# Patient Record
Sex: Female | Born: 1970 | Race: Black or African American | Hispanic: No | Marital: Single | Smoking: Never smoker
Health system: Southern US, Community
[De-identification: ages and names within clinical notes are randomized; demographics above are authoritative.]

## PROBLEM LIST (undated history)

## (undated) DIAGNOSIS — E119 Type 2 diabetes mellitus without complications: Secondary | ICD-10-CM

## (undated) DIAGNOSIS — E7800 Pure hypercholesterolemia, unspecified: Secondary | ICD-10-CM

## (undated) DIAGNOSIS — I5181 Takotsubo syndrome: Secondary | ICD-10-CM

## (undated) DIAGNOSIS — I10 Essential (primary) hypertension: Secondary | ICD-10-CM

## (undated) DIAGNOSIS — R7303 Prediabetes: Secondary | ICD-10-CM

## (undated) DIAGNOSIS — N200 Calculus of kidney: Secondary | ICD-10-CM

## (undated) HISTORY — DX: Type 2 diabetes mellitus without complications: E11.9

## (undated) HISTORY — PX: OTHER SURGICAL HISTORY: SHX169

## (undated) HISTORY — DX: Takotsubo syndrome: I51.81

## (undated) HISTORY — PX: BARIATRIC SURGERY: SHX1103

## (undated) HISTORY — PX: BREAST BIOPSY: SHX20

## (undated) HISTORY — DX: Calculus of kidney: N20.0

## (undated) HISTORY — PX: MENISCUS REPAIR: SHX5179

## (undated) HISTORY — PX: HEEL SPUR EXCISION: SHX1733

---

## 2009-11-16 DIAGNOSIS — E119 Type 2 diabetes mellitus without complications: Secondary | ICD-10-CM | POA: Insufficient documentation

## 2009-11-16 DIAGNOSIS — I10 Essential (primary) hypertension: Secondary | ICD-10-CM | POA: Insufficient documentation

## 2012-10-13 DIAGNOSIS — G4733 Obstructive sleep apnea (adult) (pediatric): Secondary | ICD-10-CM | POA: Insufficient documentation

## 2012-12-09 HISTORY — PX: BARIATRIC SURGERY: SHX1103

## 2018-03-19 DIAGNOSIS — H5213 Myopia, bilateral: Secondary | ICD-10-CM | POA: Insufficient documentation

## 2021-12-29 ENCOUNTER — Encounter (HOSPITAL_BASED_OUTPATIENT_CLINIC_OR_DEPARTMENT_OTHER): Payer: Self-pay

## 2021-12-29 ENCOUNTER — Other Ambulatory Visit: Payer: Self-pay

## 2021-12-29 ENCOUNTER — Inpatient Hospital Stay (EMERGENCY_DEPARTMENT_HOSPITAL)
Admission: AD | Admit: 2021-12-29 | Discharge: 2021-12-29 | Disposition: A | Discharge status: Home / Self Care | Payer: 59 | Source: Home / Self Care | Attending: Family Medicine | Admitting: Family Medicine

## 2021-12-29 ENCOUNTER — Emergency Department (HOSPITAL_BASED_OUTPATIENT_CLINIC_OR_DEPARTMENT_OTHER)
Admission: EM | Admit: 2021-12-29 | Discharge: 2021-12-29 | Disposition: A | Discharge status: Home / Self Care | Payer: 59 | Attending: Emergency Medicine | Admitting: Emergency Medicine

## 2021-12-29 DIAGNOSIS — E1165 Type 2 diabetes mellitus with hyperglycemia: Secondary | ICD-10-CM | POA: Diagnosis not present

## 2021-12-29 DIAGNOSIS — Z7984 Long term (current) use of oral hypoglycemic drugs: Secondary | ICD-10-CM | POA: Insufficient documentation

## 2021-12-29 DIAGNOSIS — N751 Abscess of Bartholin's gland: Secondary | ICD-10-CM | POA: Diagnosis not present

## 2021-12-29 DIAGNOSIS — Z79899 Other long term (current) drug therapy: Secondary | ICD-10-CM | POA: Insufficient documentation

## 2021-12-29 DIAGNOSIS — R739 Hyperglycemia, unspecified: Secondary | ICD-10-CM

## 2021-12-29 DIAGNOSIS — N898 Other specified noninflammatory disorders of vagina: Secondary | ICD-10-CM | POA: Insufficient documentation

## 2021-12-29 DIAGNOSIS — N949 Unspecified condition associated with female genital organs and menstrual cycle: Secondary | ICD-10-CM | POA: Diagnosis present

## 2021-12-29 HISTORY — DX: Essential (primary) hypertension: I10

## 2021-12-29 HISTORY — DX: Prediabetes: R73.03

## 2021-12-29 HISTORY — DX: Pure hypercholesterolemia, unspecified: E78.00

## 2021-12-29 LAB — CBC WITH DIFFERENTIAL/PLATELET
Abs Immature Granulocytes: 0.02 10*3/uL (ref 0.00–0.07)
Basophils Absolute: 0.1 10*3/uL (ref 0.0–0.1)
Basophils Relative: 1 %
Eosinophils Absolute: 0.2 10*3/uL (ref 0.0–0.5)
Eosinophils Relative: 2 %
HCT: 41.4 % (ref 36.0–46.0)
Hemoglobin: 13.4 g/dL (ref 12.0–15.0)
Immature Granulocytes: 0 %
Lymphocytes Relative: 25 %
Lymphs Abs: 2.2 10*3/uL (ref 0.7–4.0)
MCH: 27.9 pg (ref 26.0–34.0)
MCHC: 32.4 g/dL (ref 30.0–36.0)
MCV: 86.1 fL (ref 80.0–100.0)
Monocytes Absolute: 0.8 10*3/uL (ref 0.1–1.0)
Monocytes Relative: 9 %
Neutro Abs: 5.6 10*3/uL (ref 1.7–7.7)
Neutrophils Relative %: 63 %
Platelets: 260 10*3/uL (ref 150–400)
RBC: 4.81 MIL/uL (ref 3.87–5.11)
RDW: 13.2 % (ref 11.5–15.5)
WBC: 8.8 10*3/uL (ref 4.0–10.5)
nRBC: 0 % (ref 0.0–0.2)

## 2021-12-29 LAB — URINALYSIS, ROUTINE W REFLEX MICROSCOPIC
Bilirubin Urine: NEGATIVE
Glucose, UA: NEGATIVE mg/dL
Ketones, ur: NEGATIVE mg/dL
Leukocytes,Ua: NEGATIVE
Nitrite: NEGATIVE
Protein, ur: NEGATIVE mg/dL
Specific Gravity, Urine: 1.025 (ref 1.005–1.030)
pH: 7 (ref 5.0–8.0)

## 2021-12-29 LAB — WET PREP, GENITAL
Clue Cells Wet Prep HPF POC: NONE SEEN
Sperm: NONE SEEN
Trich, Wet Prep: NONE SEEN
WBC, Wet Prep HPF POC: <10 (ref <10)
Yeast Wet Prep HPF POC: NONE SEEN

## 2021-12-29 LAB — BASIC METABOLIC PANEL
Anion gap: 9 (ref 5–15)
BUN: 8 mg/dL (ref 6–20)
CO2: 29 mmol/L (ref 22–32)
Calcium: 9 mg/dL (ref 8.9–10.3)
Chloride: 102 mmol/L (ref 98–111)
Creatinine, Ser: 0.69 mg/dL (ref 0.44–1.00)
GFR, Estimated: >60 mL/min (ref >60)
Glucose, Bld: 184 mg/dL — ABNORMAL HIGH (ref 70–99)
Potassium: 3.7 mmol/L (ref 3.5–5.1)
Sodium: 140 mmol/L (ref 135–145)

## 2021-12-29 LAB — HIV ANTIBODY (ROUTINE TESTING W REFLEX): HIV Screen 4th Generation wRfx: NONREACTIVE

## 2021-12-29 LAB — HEPATITIS PANEL, ACUTE
HCV Ab: NONREACTIVE
Hep A IgM: NONREACTIVE
Hep B C IgM: NONREACTIVE
Hepatitis B Surface Ag: NONREACTIVE

## 2021-12-29 LAB — URINALYSIS, MICROSCOPIC (REFLEX)

## 2021-12-29 LAB — PREGNANCY, URINE: Preg Test, Ur: NEGATIVE

## 2021-12-29 MED ORDER — LIDOCAINE-EPINEPHRINE (PF) 2 %-1:200000 IJ SOLN
10.0000 mL | Freq: Once | INTRAMUSCULAR | Status: AC
  Administered 2021-12-29: 10 mL
  Filled 2021-12-29: qty 20

## 2021-12-29 MED ORDER — SULFAMETHOXAZOLE-TRIMETHOPRIM 800-160 MG PO TABS: 1.0000 | ORAL_TABLET | Freq: Two times a day (BID) | ORAL | 0 refills | Status: AC

## 2021-12-29 MED ORDER — FLUCONAZOLE 150 MG PO TABS: 150.0000 mg | ORAL_TABLET | Freq: Once | ORAL | 0 refills | Status: AC

## 2021-12-29 NOTE — Discharge Instructions (Addendum)
Try to eat a low carbohydrate diet.  Follow up with your pcp when you go back to [facility].

## 2021-12-29 NOTE — ED Provider Notes (Signed)
[facility] EMERGENCY DEPARTMENT Provider Note   CSN: [id number] Arrival date & time: 12/29/21  [id number]     History  Chief Complaint  Patient presents with   Vaginal Pain    Alicia Booth is a 51 y.o. female.  Pt is a 51 yo bf with a hx of htn, high cholesterol, and pre-diabetes on metformin.  She presents to the ED today with vaginal discomfort.  She feels that something is wrong.  She has recently had sex and wants to be checked for a std.  She also feels her bartholin's cyst is bigger.  She has recently moved here from [facility] and does not have a pcp or a gyn doctor.      Home Medications Prior to Admission medications   Medication Sig Start Date End Date Taking? Authorizing Provider  fluconazole (DIFLUCAN) 150 MG tablet Take 1 tablet (150 mg total) by mouth once for 1 dose. 12/29/21 12/29/21 Yes Isla Pence, MD  fluticasone [facility]) 50 MCG/ACT nasal spray Place into both nostrils as needed for allergies or rhinitis.   Yes [provider]  lisinopril (ZESTRIL) 40 MG tablet Take by mouth daily.   Yes [provider]  metFORMIN (GLUCOPHAGE-XR) 500 MG 24 hr tablet Take 500 mg by mouth daily with breakfast.   Yes [provider]  metoprolol tartrate (LOPRESSOR) 25 MG tablet Take 25 mg by mouth 2 (two) times daily.   Yes [provider]  rosuvastatin (CRESTOR) 40 MG tablet Take by mouth daily.   Yes [provider]  sulfamethoxazole-trimethoprim (BACTRIM DS) 800-160 MG tablet Take 1 tablet by mouth 2 (two) times daily for 7 days. 12/29/21 01/05/22 Yes Isla Pence, MD      Allergies    Lipitor [atorvastatin]    Review of Systems   Review of Systems  Genitourinary:  Positive for vaginal pain.  All other systems reviewed and are negative.  Physical Exam Updated Vital Signs BP (!) 154/97 (BP Location: Left Arm)    Pulse 70    Temp 98.5 F (36.9 C)    Resp 18    Ht 5\' 7"  (1.702 m)    Wt 90.7 kg    SpO2 97%    BMI 31.32 kg/m   Physical Exam Vitals and nursing note reviewed. Exam conducted with a chaperone present.  Constitutional:      Appearance: Normal appearance.  HENT:     Head: Normocephalic and atraumatic.     Right Ear: External ear normal.     Left Ear: External ear normal.     Nose: Nose normal.     Mouth/Throat:     Mouth: Mucous membranes are moist.     Pharynx: Oropharynx is clear.  Eyes:     Extraocular Movements: Extraocular movements intact.     Conjunctiva/sclera: Conjunctivae normal.     Pupils: Pupils are equal, round, and reactive to light.  Cardiovascular:     Rate and Rhythm: Normal rate and regular rhythm.     Pulses: Normal pulses.     Heart sounds: Normal heart sounds.  Pulmonary:     Effort: Pulmonary effort is normal.     Breath sounds: Normal breath sounds.  Abdominal:     General: Abdomen is flat. Bowel sounds are normal.     Palpations: Abdomen is soft.  Genitourinary:    Cervix: Normal.     Uterus: Normal.      Comments: + large bartholin's cyst on right.  It is swollen, red, and tender  to palpation. Musculoskeletal:        General: Normal range of motion.     Cervical back: Normal range of motion and neck supple.  Skin:    General: Skin is warm.     Capillary Refill: Capillary refill takes less than 2 seconds.  Neurological:     General: No focal deficit present.     Mental Status: She is alert and oriented to person, place, and time.  Psychiatric:        Mood and Affect: Mood normal.        Behavior: Behavior normal.    ED Results / Procedures / Treatments   Labs (all labs ordered are listed, but only abnormal results are displayed) Labs Reviewed  URINALYSIS, ROUTINE W REFLEX MICROSCOPIC - Abnormal; Notable for the following components:      Result Value   APPearance HAZY (*)    Hgb urine dipstick MODERATE (*)    All other components within normal limits  BASIC METABOLIC PANEL - Abnormal; Notable for the following components:   Glucose, Bld 184 (*)     All other components within normal limits  URINALYSIS, MICROSCOPIC (REFLEX) - Abnormal; Notable for the following components:   Bacteria, UA MANY (*)    All other components within normal limits  WET PREP, GENITAL  PREGNANCY, URINE  CBC WITH DIFFERENTIAL/PLATELET  HEPATITIS PANEL, ACUTE  HIV ANTIBODY (ROUTINE TESTING W REFLEX)  GC/CHLAMYDIA PROBE AMP ([place]) NOT AT [facility]    EKG None  Radiology No results found.  Procedures .Marland KitchenIncision and Drainage  Date/Time: 12/29/2021 10:57 AM Performed by: Isla Pence, MD Authorized by: Isla Pence, MD   Consent:    Consent obtained:  Verbal   Consent given by:  Patient   Risks discussed:  Bleeding and incomplete drainage   Alternatives discussed:  No treatment Universal protocol:    Procedure explained and questions answered to patient or proxy's satisfaction: yes     Patient identity confirmed:  Verbally with patient Location:    Type:  Bartholin cyst   Size:  5 Pre-procedure details:    Skin preparation:  Povidone-iodine Anesthesia:    Anesthesia method:  Local infiltration   Local anesthetic:  Lidocaine 2% WITH epi Procedure type:    Complexity:  Simple Procedure details:    Ultrasound guidance: no     Incision types:  Single straight   Drainage:  Purulent   Drainage amount:  Copious   Packing materials:  None Post-procedure details:    Procedure completion:  Tolerated well, no immediate complications    Medications Ordered in ED Medications  lidocaine-EPINEPHrine (XYLOCAINE W/EPI) 2 %-1:200000 (PF) injection 10 mL (10 mLs Infiltration Given by Other 12/29/21 1043)    ED Course/ Medical Decision Making/ A&P                           Medical Decision Making Amount and/or Complexity of Data Reviewed Labs: ordered.  Risk Prescription drug management.   Pt has a large bartholin's cyst on the right.  This was drained.  Pt is to f/u with gyn for the cyst.  She is put on bactrim for treatment as it was  abscessed.  She is to do sitz baths at home.  She opts not to be treated for STDs as she has no d/c and has a source for her vaginal discomfort.    Pt said she gets yeast infections with abx.  She is given diflucan as  well as the bactrim.  She is encouraged to eat yogurt.  She said probiotics given her yeast infections as well.  BS is 184.  She is encouraged to eat a low carb diet and f/u with her pcp when she goes back to [facility].  Pt is stable for d/c.  Return if worse.        Final Clinical Impression(s) / ED Diagnoses Final diagnoses:  Abscess of right Bartholin's gland  Hyperglycemia    Rx / DC Orders ED Discharge Orders          Ordered    sulfamethoxazole-trimethoprim (BACTRIM DS) 800-160 MG tablet  2 times daily        12/29/21 1056    fluconazole (DIFLUCAN) 150 MG tablet   Once        12/29/21 1106              Isla Pence, MD 12/29/21 1108

## 2021-12-29 NOTE — MAU Provider Note (Signed)
S Ms. Alicia Booth is a 51 y.o. No obstetric history on file. female who presents to MAU today with complaint of possible bartholin cyst. She has tried using "honey pot suppositories" but hasn't helped. She is concerned about a possible STD.   ROS: +vag cyst  O BP 129/78 (BP Location: Right Arm)    Pulse 77    Temp 98.8 F (37.1 C) (Oral)    Resp 17    SpO2 100%  Physical Exam Constitutional:      General: She is not in acute distress.    Appearance: Normal appearance.  HENT:     Head: Normocephalic and atraumatic.  Cardiovascular:     Rate and Rhythm: Normal rate.  Pulmonary:     Effort: Pulmonary effort is normal. No respiratory distress.  Musculoskeletal:        General: Normal range of motion.     Cervical back: Normal range of motion.  Neurological:     General: No focal deficit present.     Mental Status: She is alert and oriented to person, place, and time.  Psychiatric:        Mood and Affect: Mood normal.        Behavior: Behavior normal.    MDM: No concern for pregnancy. Offered pt options for eval at [facility] or ED, she's from [facility] and does not have GYN provider in the area. pt will go to [facility]. Stable for discharge.  A 1. Vaginal cyst    P Discharge home Follow up at another facility for eval Patient may return to MAU as needed for pregnancy related complaints  Julianne Handler, CNM 12/29/2021 9:12 AM

## 2021-12-29 NOTE — ED Notes (Signed)
pelvic   Isla Pence, MD 12/29/21 1101

## 2021-12-29 NOTE — ED Notes (Signed)
ED Provider at bedside. 

## 2021-12-29 NOTE — MAU Note (Addendum)
...  Alicia Booth is a 51 y.o. non pregnant patient here in MAU reporting: "I need somebody to look down there and tell me if I have BV or chlamydia." She denies any vaginal discharge or odor. She states she has a history of bartholin cyst's as well and states the one on her right side is swollen. She denies any pain but states she just feels discomfort. She states she recently moved back to [facility] and is working remotely and has not established GYN care anywhere but has care in [place]. She was told she could be evaluated in MAU.  She is not pregnant. LMP: 12/02/2021  Denies pain.

## 2021-12-29 NOTE — ED Triage Notes (Signed)
Pt reports vaginal discomfort/irritation since last Wednesday. Denies any discharge or any other symptoms.

## 2021-12-31 LAB — GC/CHLAMYDIA PROBE AMP (~~LOC~~) NOT AT ARMC
Chlamydia: NEGATIVE
Comment: NEGATIVE
Comment: NORMAL
Neisseria Gonorrhea: NEGATIVE

## 2022-02-07 ENCOUNTER — Other Ambulatory Visit: Payer: Self-pay | Admitting: Internal Medicine

## 2022-02-07 DIAGNOSIS — Z1231 Encounter for screening mammogram for malignant neoplasm of breast: Secondary | ICD-10-CM

## 2022-02-20 ENCOUNTER — Other Ambulatory Visit: Payer: Self-pay

## 2022-02-20 ENCOUNTER — Ambulatory Visit
Admission: RE | Admit: 2022-02-20 | Discharge: 2022-02-20 | Disposition: A | Discharge status: Home / Self Care | Payer: 59 | Source: Ambulatory Visit | Attending: Internal Medicine | Admitting: Internal Medicine

## 2022-02-20 DIAGNOSIS — Z1231 Encounter for screening mammogram for malignant neoplasm of breast: Secondary | ICD-10-CM

## 2022-04-10 ENCOUNTER — Encounter (HOSPITAL_BASED_OUTPATIENT_CLINIC_OR_DEPARTMENT_OTHER): Payer: Self-pay | Admitting: Urology

## 2022-04-10 ENCOUNTER — Emergency Department (HOSPITAL_BASED_OUTPATIENT_CLINIC_OR_DEPARTMENT_OTHER): Payer: 59

## 2022-04-10 DIAGNOSIS — Z8249 Family history of ischemic heart disease and other diseases of the circulatory system: Secondary | ICD-10-CM

## 2022-04-10 DIAGNOSIS — E7800 Pure hypercholesterolemia, unspecified: Secondary | ICD-10-CM | POA: Diagnosis present

## 2022-04-10 DIAGNOSIS — I214 Non-ST elevation (NSTEMI) myocardial infarction: Principal | ICD-10-CM | POA: Diagnosis present

## 2022-04-10 DIAGNOSIS — E669 Obesity, unspecified: Secondary | ICD-10-CM | POA: Diagnosis present

## 2022-04-10 DIAGNOSIS — Z79899 Other long term (current) drug therapy: Secondary | ICD-10-CM

## 2022-04-10 DIAGNOSIS — Z6831 Body mass index (BMI) 31.0-31.9, adult: Secondary | ICD-10-CM

## 2022-04-10 DIAGNOSIS — Z9884 Bariatric surgery status: Secondary | ICD-10-CM

## 2022-04-10 DIAGNOSIS — E119 Type 2 diabetes mellitus without complications: Secondary | ICD-10-CM | POA: Diagnosis present

## 2022-04-10 DIAGNOSIS — Z888 Allergy status to other drugs, medicaments and biological substances status: Secondary | ICD-10-CM

## 2022-04-10 DIAGNOSIS — I10 Essential (primary) hypertension: Secondary | ICD-10-CM | POA: Diagnosis present

## 2022-04-10 DIAGNOSIS — Z7984 Long term (current) use of oral hypoglycemic drugs: Secondary | ICD-10-CM

## 2022-04-10 DIAGNOSIS — I5181 Takotsubo syndrome: Secondary | ICD-10-CM | POA: Diagnosis present

## 2022-04-10 LAB — BASIC METABOLIC PANEL
Anion gap: 10 (ref 5–15)
BUN: 11 mg/dL (ref 6–20)
CO2: 27 mmol/L (ref 22–32)
Calcium: 9.4 mg/dL (ref 8.9–10.3)
Chloride: 100 mmol/L (ref 98–111)
Creatinine, Ser: 0.74 mg/dL (ref 0.44–1.00)
GFR, Estimated: >60 mL/min (ref >60)
Glucose, Bld: 243 mg/dL — ABNORMAL HIGH (ref 70–99)
Potassium: 3.7 mmol/L (ref 3.5–5.1)
Sodium: 137 mmol/L (ref 135–145)

## 2022-04-10 LAB — CBC
HCT: 43.3 % (ref 36.0–46.0)
Hemoglobin: 13.7 g/dL (ref 12.0–15.0)
MCH: 27.5 pg (ref 26.0–34.0)
MCHC: 31.6 g/dL (ref 30.0–36.0)
MCV: 86.8 fL (ref 80.0–100.0)
Platelets: 249 10*3/uL (ref 150–400)
RBC: 4.99 MIL/uL (ref 3.87–5.11)
RDW: 13.4 % (ref 11.5–15.5)
WBC: 11.5 10*3/uL — ABNORMAL HIGH (ref 4.0–10.5)
nRBC: 0 % (ref 0.0–0.2)

## 2022-04-10 LAB — TROPONIN I (HIGH SENSITIVITY): Troponin I (High Sensitivity): 45 ng/L — ABNORMAL HIGH (ref <18)

## 2022-04-10 NOTE — ED Triage Notes (Signed)
Chest pain and heaviness that started this evening while on the couch ?Got bad news about mother today, anxiety  ?VS wnl with EMS, BS 216 \ ? ?Pt report pain radiating to jaw and bilateral arms ?

## 2022-04-11 ENCOUNTER — Encounter (HOSPITAL_COMMUNITY)
Admission: EM | Disposition: A | Discharge status: Home / Self Care | Payer: Self-pay | Source: Home / Self Care | Attending: Cardiology

## 2022-04-11 ENCOUNTER — Inpatient Hospital Stay (HOSPITAL_BASED_OUTPATIENT_CLINIC_OR_DEPARTMENT_OTHER)
Admission: EM | Admit: 2022-04-11 | Discharge: 2022-04-12 | DRG: 281 | Disposition: A | Discharge status: Home / Self Care | Payer: 59 | Attending: Cardiology | Admitting: Cardiology

## 2022-04-11 ENCOUNTER — Encounter (HOSPITAL_COMMUNITY): Payer: Self-pay | Admitting: Cardiovascular Disease

## 2022-04-11 ENCOUNTER — Observation Stay (HOSPITAL_COMMUNITY): Payer: 59

## 2022-04-11 DIAGNOSIS — I214 Non-ST elevation (NSTEMI) myocardial infarction: Principal | ICD-10-CM | POA: Diagnosis present

## 2022-04-11 DIAGNOSIS — E119 Type 2 diabetes mellitus without complications: Secondary | ICD-10-CM | POA: Diagnosis present

## 2022-04-11 DIAGNOSIS — E669 Obesity, unspecified: Secondary | ICD-10-CM | POA: Diagnosis present

## 2022-04-11 DIAGNOSIS — Z6831 Body mass index (BMI) 31.0-31.9, adult: Secondary | ICD-10-CM | POA: Diagnosis not present

## 2022-04-11 DIAGNOSIS — Z7984 Long term (current) use of oral hypoglycemic drugs: Secondary | ICD-10-CM | POA: Diagnosis not present

## 2022-04-11 DIAGNOSIS — Z79899 Other long term (current) drug therapy: Secondary | ICD-10-CM | POA: Diagnosis not present

## 2022-04-11 DIAGNOSIS — Z8249 Family history of ischemic heart disease and other diseases of the circulatory system: Secondary | ICD-10-CM | POA: Diagnosis not present

## 2022-04-11 DIAGNOSIS — Z9884 Bariatric surgery status: Secondary | ICD-10-CM | POA: Diagnosis not present

## 2022-04-11 DIAGNOSIS — E7800 Pure hypercholesterolemia, unspecified: Secondary | ICD-10-CM | POA: Diagnosis present

## 2022-04-11 DIAGNOSIS — I5181 Takotsubo syndrome: Secondary | ICD-10-CM | POA: Diagnosis present

## 2022-04-11 DIAGNOSIS — I10 Essential (primary) hypertension: Secondary | ICD-10-CM | POA: Diagnosis present

## 2022-04-11 DIAGNOSIS — Z888 Allergy status to other drugs, medicaments and biological substances status: Secondary | ICD-10-CM | POA: Diagnosis not present

## 2022-04-11 HISTORY — PX: LEFT HEART CATH AND CORONARY ANGIOGRAPHY: CATH118249

## 2022-04-11 LAB — CBC
HCT: 40.4 % (ref 36.0–46.0)
Hemoglobin: 13 g/dL (ref 12.0–15.0)
MCH: 28 pg (ref 26.0–34.0)
MCHC: 32.2 g/dL (ref 30.0–36.0)
MCV: 87.1 fL (ref 80.0–100.0)
Platelets: 250 10*3/uL (ref 150–400)
RBC: 4.64 MIL/uL (ref 3.87–5.11)
RDW: 13.4 % (ref 11.5–15.5)
WBC: 10.6 10*3/uL — ABNORMAL HIGH (ref 4.0–10.5)
nRBC: 0 % (ref 0.0–0.2)

## 2022-04-11 LAB — GLUCOSE, CAPILLARY
Glucose-Capillary: 105 mg/dL — ABNORMAL HIGH (ref 70–99)
Glucose-Capillary: 132 mg/dL — ABNORMAL HIGH (ref 70–99)
Glucose-Capillary: 141 mg/dL — ABNORMAL HIGH (ref 70–99)

## 2022-04-11 LAB — HEMOGLOBIN A1C
Hgb A1c MFr Bld: 7.1 % — ABNORMAL HIGH (ref 4.8–5.6)
Mean Plasma Glucose: 157.07 mg/dL

## 2022-04-11 LAB — ECHOCARDIOGRAM COMPLETE
AR max vel: 3.22 cm2
AV Area VTI: 3.04 cm2
AV Area mean vel: 2.96 cm2
AV Mean grad: 4 mmHg
AV Peak grad: 6.3 mmHg
Ao pk vel: 1.25 m/s
Area-P 1/2: 3.61 cm2
Height: 67 in
S' Lateral: 3 cm
Weight: 3244.8 oz

## 2022-04-11 LAB — CREATININE, SERUM
Creatinine, Ser: 0.65 mg/dL (ref 0.44–1.00)
GFR, Estimated: >60 mL/min (ref >60)

## 2022-04-11 LAB — PREGNANCY, URINE: Preg Test, Ur: NEGATIVE

## 2022-04-11 LAB — TROPONIN I (HIGH SENSITIVITY)
Troponin I (High Sensitivity): 761 ng/L (ref <18)
Troponin I (High Sensitivity): 1481 ng/L (ref <18)
Troponin I (High Sensitivity): 1365 ng/L (ref <18)

## 2022-04-11 LAB — HEPARIN LEVEL (UNFRACTIONATED): Heparin Unfractionated: 0.11 IU/mL — ABNORMAL LOW (ref 0.30–0.70)

## 2022-04-11 SURGERY — LEFT HEART CATH AND CORONARY ANGIOGRAPHY
Anesthesia: LOCAL

## 2022-04-11 MED ORDER — ACETAMINOPHEN 325 MG PO TABS: 650.0000 mg | ORAL_TABLET | ORAL | Status: DC | PRN

## 2022-04-11 MED ORDER — LIDOCAINE HCL (PF) 1 % IJ SOLN
INTRAMUSCULAR | Status: DC | PRN
  Administered 2022-04-11: 2 mL

## 2022-04-11 MED ORDER — SODIUM CHLORIDE 0.9% FLUSH: 3.0000 mL | Freq: Two times a day (BID) | INTRAVENOUS | Status: DC

## 2022-04-11 MED ORDER — HEPARIN SODIUM (PORCINE) 1000 UNIT/ML IJ SOLN
INTRAMUSCULAR | Status: DC | PRN
Start: 2022-04-11 — End: 2022-04-11
  Administered 2022-04-11: 4500 [IU] via INTRAVENOUS

## 2022-04-11 MED ORDER — INSULIN ASPART 100 UNIT/ML IJ SOLN
0.0000 [IU] | Freq: Three times a day (TID) | INTRAMUSCULAR | Status: DC
  Administered 2022-04-11: 2 [IU] via SUBCUTANEOUS
  Administered 2022-04-12: 3 [IU] via SUBCUTANEOUS

## 2022-04-11 MED ORDER — SODIUM CHLORIDE 0.9% FLUSH: 3.0000 mL | INTRAVENOUS | Status: DC | PRN

## 2022-04-11 MED ORDER — METOPROLOL TARTRATE 50 MG PO TABS
50.0000 mg | ORAL_TABLET | Freq: Two times a day (BID) | ORAL | Status: DC
  Administered 2022-04-11 – 2022-04-12 (×3): 50 mg via ORAL
  Filled 2022-04-11 (×3): qty 1

## 2022-04-11 MED ORDER — NITROGLYCERIN IN D5W 200-5 MCG/ML-% IV SOLN
0.0000 ug/min | INTRAVENOUS | Status: DC
  Administered 2022-04-11: 5 ug/min via INTRAVENOUS
  Filled 2022-04-11: qty 250

## 2022-04-11 MED ORDER — ZOLPIDEM TARTRATE 5 MG PO TABS: 5.0000 mg | ORAL_TABLET | Freq: Every evening | ORAL | Status: DC | PRN

## 2022-04-11 MED ORDER — SODIUM CHLORIDE 0.9 % WEIGHT BASED INFUSION: 1.0000 mL/kg/h | INTRAVENOUS | Status: DC

## 2022-04-11 MED ORDER — ASPIRIN EC 81 MG PO TBEC
324.0000 mg | DELAYED_RELEASE_TABLET | Freq: Every day | ORAL | Status: DC
  Filled 2022-04-11: qty 4

## 2022-04-11 MED ORDER — MAGNESIUM HYDROXIDE 400 MG/5ML PO SUSP
15.0000 mL | Freq: Every day | ORAL | Status: DC | PRN
  Administered 2022-04-11: 15 mL via ORAL
  Filled 2022-04-11: qty 30

## 2022-04-11 MED ORDER — ASPIRIN EC 81 MG PO TBEC
81.0000 mg | DELAYED_RELEASE_TABLET | Freq: Every day | ORAL | Status: DC
  Administered 2022-04-11: 81 mg via ORAL

## 2022-04-11 MED ORDER — IOHEXOL 350 MG/ML SOLN
INTRAVENOUS | Status: DC | PRN
Start: 2022-04-11 — End: 2022-04-11
  Administered 2022-04-11: 75 mL

## 2022-04-11 MED ORDER — SODIUM CHLORIDE 0.9 % IV SOLN: 250.0000 mL | INTRAVENOUS | Status: DC | PRN

## 2022-04-11 MED ORDER — MIDAZOLAM HCL 2 MG/2ML IJ SOLN
INTRAMUSCULAR | Status: DC | PRN
  Administered 2022-04-11: 2 mg via INTRAVENOUS
  Administered 2022-04-11: 1 mg via INTRAVENOUS

## 2022-04-11 MED ORDER — HYDRALAZINE HCL 20 MG/ML IJ SOLN: 10.0000 mg | INTRAMUSCULAR | Status: AC | PRN

## 2022-04-11 MED ORDER — SODIUM CHLORIDE 0.9 % WEIGHT BASED INFUSION: 3.0000 mL/kg/h | INTRAVENOUS | Status: DC

## 2022-04-11 MED ORDER — VERAPAMIL HCL 2.5 MG/ML IV SOLN
INTRAVENOUS | Status: AC
  Filled 2022-04-11: qty 2

## 2022-04-11 MED ORDER — FLUTICASONE PROPIONATE 50 MCG/ACT NA SUSP
2.0000 | NASAL | Status: DC | PRN
  Filled 2022-04-11: qty 16

## 2022-04-11 MED ORDER — ACETAMINOPHEN 325 MG PO TABS
650.0000 mg | ORAL_TABLET | ORAL | Status: DC | PRN
  Administered 2022-04-11 – 2022-04-12 (×6): 650 mg via ORAL
  Filled 2022-04-11 (×6): qty 2

## 2022-04-11 MED ORDER — ONDANSETRON HCL 4 MG/2ML IJ SOLN: 4.0000 mg | Freq: Four times a day (QID) | INTRAMUSCULAR | Status: DC | PRN

## 2022-04-11 MED ORDER — LIDOCAINE HCL (PF) 1 % IJ SOLN
INTRAMUSCULAR | Status: AC
  Filled 2022-04-11: qty 30

## 2022-04-11 MED ORDER — HEPARIN BOLUS VIA INFUSION
4000.0000 [IU] | Freq: Once | INTRAVENOUS | Status: AC
  Administered 2022-04-11: 4000 [IU] via INTRAVENOUS

## 2022-04-11 MED ORDER — HEPARIN SODIUM (PORCINE) 1000 UNIT/ML IJ SOLN
INTRAMUSCULAR | Status: AC
  Filled 2022-04-11: qty 10

## 2022-04-11 MED ORDER — MIDAZOLAM HCL 2 MG/2ML IJ SOLN
INTRAMUSCULAR | Status: AC
  Filled 2022-04-11: qty 2

## 2022-04-11 MED ORDER — FENTANYL CITRATE (PF) 100 MCG/2ML IJ SOLN
INTRAMUSCULAR | Status: DC | PRN
  Administered 2022-04-11 (×2): 25 ug via INTRAVENOUS

## 2022-04-11 MED ORDER — VERAPAMIL HCL 2.5 MG/ML IV SOLN
INTRAVENOUS | Status: DC | PRN
  Administered 2022-04-11: 10 mL via INTRA_ARTERIAL

## 2022-04-11 MED ORDER — NITROGLYCERIN 0.4 MG SL SUBL
0.4000 mg | SUBLINGUAL_TABLET | SUBLINGUAL | Status: DC | PRN
Start: 2022-04-11 — End: 2022-04-12

## 2022-04-11 MED ORDER — FENTANYL CITRATE (PF) 100 MCG/2ML IJ SOLN
INTRAMUSCULAR | Status: AC
  Filled 2022-04-11: qty 2

## 2022-04-11 MED ORDER — HEPARIN (PORCINE) 25000 UT/250ML-% IV SOLN
950.0000 [IU]/h | INTRAVENOUS | Status: DC
  Administered 2022-04-11: 950 [IU]/h via INTRAVENOUS
  Filled 2022-04-11: qty 250

## 2022-04-11 MED ORDER — ACETAMINOPHEN 500 MG PO TABS
1000.0000 mg | ORAL_TABLET | Freq: Once | ORAL | Status: DC
  Filled 2022-04-11: qty 2

## 2022-04-11 MED ORDER — ROSUVASTATIN CALCIUM 20 MG PO TABS
40.0000 mg | ORAL_TABLET | Freq: Every day | ORAL | Status: DC
  Administered 2022-04-12: 40 mg via ORAL
  Filled 2022-04-11: qty 2

## 2022-04-11 MED ORDER — SODIUM CHLORIDE 0.9% FLUSH
3.0000 mL | Freq: Two times a day (BID) | INTRAVENOUS | Status: DC
Start: 2022-04-11 — End: 2022-04-11

## 2022-04-11 MED ORDER — ASPIRIN EC 81 MG PO TBEC
81.0000 mg | DELAYED_RELEASE_TABLET | Freq: Every day | ORAL | Status: DC
  Administered 2022-04-12: 81 mg via ORAL
  Filled 2022-04-11: qty 1

## 2022-04-11 MED ORDER — INSULIN ASPART 100 UNIT/ML IJ SOLN: 0.0000 [IU] | Freq: Every day | INTRAMUSCULAR | Status: DC

## 2022-04-11 MED ORDER — HEPARIN (PORCINE) IN NACL 1000-0.9 UT/500ML-% IV SOLN
INTRAVENOUS | Status: DC | PRN
  Administered 2022-04-11 (×2): 500 mL

## 2022-04-11 MED ORDER — HEPARIN SODIUM (PORCINE) 5000 UNIT/ML IJ SOLN
5000.0000 [IU] | Freq: Three times a day (TID) | INTRAMUSCULAR | Status: DC
  Administered 2022-04-11 – 2022-04-12 (×2): 5000 [IU] via SUBCUTANEOUS
  Filled 2022-04-11 (×3): qty 1

## 2022-04-11 MED ORDER — LABETALOL HCL 5 MG/ML IV SOLN: 10.0000 mg | INTRAVENOUS | Status: AC | PRN

## 2022-04-11 MED ORDER — ALPRAZOLAM 0.25 MG PO TABS: 0.2500 mg | ORAL_TABLET | Freq: Two times a day (BID) | ORAL | Status: DC | PRN

## 2022-04-11 MED ORDER — SODIUM CHLORIDE 0.9 % WEIGHT BASED INFUSION
3.0000 mL/kg/h | INTRAVENOUS | Status: DC
  Administered 2022-04-11: 3 mL/kg/h via INTRAVENOUS

## 2022-04-11 MED ORDER — NITROGLYCERIN 0.4 MG SL SUBL
0.4000 mg | SUBLINGUAL_TABLET | SUBLINGUAL | Status: DC | PRN
  Administered 2022-04-11: 0.4 mg via SUBLINGUAL
  Filled 2022-04-11: qty 1

## 2022-04-11 SURGICAL SUPPLY — 11 items
CATH 5FR JL3.5 JR4 ANG PIG MP (CATHETERS) ×1 IMPLANT
DEVICE RAD COMP TR BAND LRG (VASCULAR PRODUCTS) ×1 IMPLANT
GLIDESHEATH SLEND SS 6F .021 (SHEATH) ×1 IMPLANT
GUIDEWIRE INQWIRE 1.5J.035X260 (WIRE) IMPLANT
INQWIRE 1.5J .035X260CM (WIRE) ×2
KIT HEART LEFT (KITS) ×2 IMPLANT
PACK CARDIAC CATHETERIZATION (CUSTOM PROCEDURE TRAY) ×2 IMPLANT
SHEATH PROBE COVER 6X72 (BAG) ×1 IMPLANT
SYR MEDRAD MARK 7 150ML (SYRINGE) ×2 IMPLANT
TRANSDUCER W/STOPCOCK (MISCELLANEOUS) ×2 IMPLANT
TUBING CIL FLEX 10 FLL-RA (TUBING) ×2 IMPLANT

## 2022-04-11 NOTE — ED Notes (Signed)
Client states her "chest discomfort" is now a 5 on the 0-10 scale, ECG repeated and given to ED MD for review ?

## 2022-04-11 NOTE — ED Notes (Signed)
Phone Handoff Report provided to [facility] Transport Team 

## 2022-04-11 NOTE — ED Notes (Signed)
Pt reports she took 260 mg of aspirin at home PTA around 10pm tonight. Dr. Clayborne Dana informed and states to just give one 81-mg tab of aspirin then ?

## 2022-04-11 NOTE — H&P (Addendum)
?Cardiology Admission History and Physical:  ? ?Patient ID: Alicia Booth ?MRN: [id number]; DOB: 12-01-1971  ? ?Admission date: 04/11/2022 ? ?PCP:  Juan Quam, MD ?  ?[facility] Providers ?Cardiologist:  None      ? ? ?Chief Complaint:  NSTEMI ? ?Patient Profile:  ? ?Alicia Booth is a 51 y.o. female with CP 2018 s/p normal nuc stress test (by her description), pre-DM w/ A1c of 6.2 06/2021, HTN, HLD, FH CAD, obesity, who is being seen 04/11/2022 for the evaluation of NSTEMI. ? ?History of Present Illness:  ? ?[name] had onset of chest pain last night at 10 pm. She was sitting still and eating potato chips.  ? ?The pain was a severe pressure 12/10, radiated into both arms and up into her neck.  ? ?She immediately called 911, she took 1/2 Goody powder as directed by EMS operator, because that was the only ASA in the house. That did not help the pain.  ? ?EMS did not give her anything. At [facility], she got SL NTG x 1 and that helped her pain, she was started on a drip. She is still having 3-4/10 discomfort. ? ?She has hx tension problems R shoulder, is having some discomfort in that area. ? ?Has HA from the nitro.  ? ?Never had anything like this before.  ? ?Had bariatric surgery 2014, lost 80 lbs, has gained 30 lbs back. ? ?Moved back to [place] last fall, to bring her mother home.  ? ? ?Past Medical History:  ?Diagnosis Date  ? High cholesterol   ? Hypertension   ? Pre-diabetes   ? ? ?Past Surgical History:  ?Procedure Laterality Date  ? [place] SURGERY  2014  ? BREAST BIOPSY Left   ? lymph node bx benign  ? HEEL SPUR EXCISION    ? MENISCUS REPAIR    ? tummy tuck    ?  ? ?Medications Prior to Admission: ?Prior to Admission medications   ?Medication Sig Start Date End Date Taking? Authorizing Provider  ?fluticasone (FLONASE) 50 MCG/ACT nasal spray Place into both nostrils as needed for allergies or rhinitis.    [provider]  ?lisinopril (ZESTRIL) 40 MG tablet Take by mouth daily.    [provider]  ?metFORMIN (GLUCOPHAGE-XR) 500 MG 24 hr tablet Take 500 mg by mouth daily with breakfast.    [provider]  ?metoprolol tartrate (LOPRESSOR) 25 MG tablet Take 25 mg by mouth 2 (two) times daily.    [provider]  ?rosuvastatin (CRESTOR) 40 MG tablet Take by mouth daily.    [provider]  ?  ? ?Allergies:    ?Allergies  ?Allergen Reactions  ? Lipitor [Atorvastatin] Itching  ? ? ?Social History:   ?Social History  ? ?Socioeconomic History  ? Marital status: Single  ?  Spouse name: Not on file  ? Number of children: Not on file  ? Years of education: Not on file  ? Highest education level: Not on file  ?Occupational History  ? Not on file  ?Tobacco Use  ? Smoking status: Never  ? Smokeless tobacco: Never  ?Substance and Sexual Activity  ? Alcohol use: Yes  ?  Comment: occassionally  ? Drug use: Never  ? Sexual activity: Yes  ?Other Topics Concern  ? Not on file  ?Social History Narrative  ? Not on file  ? ?Social Determinants of Health  ? ?Financial Resource Strain: Not on file  ?Food Insecurity: Not on file  ?  Transportation Needs: Not on file  ?Physical Activity: Not on file  ?Stress: Not on file  ?Social Connections: Not on file  ?Intimate Partner Violence: Not on file  ?  ?Family History:   ?The patient's family history includes Breast cancer (age of onset: 69) in her mother; Heart attack (age of onset: 69) in her maternal grandmother.   ? ?ROS:  ?Please see the history of present illness.  ?All other ROS reviewed and negative.    ? ?Physical Exam/Data:  ? ?Vitals:  ? 04/11/22 0734 04/11/22 0750 04/11/22 0846 04/11/22 0912  ?BP: (!) 141/86 (!) 141/74 136/72 140/85  ?Pulse: 78 72 73 72  ?Resp: 18 18 16 20   ?Temp:   98.1 ?F (36.7 ?C)   ?TempSrc:   Oral   ?SpO2: 99% 97% 96% 97%  ?Weight:   92 kg   ?Height:   5\' 7"  (1.702 m)   ? ?No intake or output data in the 24 hours ending 04/11/22 1009 ? ?  04/11/2022  ?  8:46 AM 04/10/2022  ? 10:54 PM 12/29/2021  ?  9:49 AM  ?Last 3 Weights  ?Weight  (lbs) 202 lb 12.8 oz 205 lb 200 lb  ?Weight (kg) 91.989 kg 92.987 kg 90.719 kg  ?   ?Body mass index is 31.76 kg/m?.  ?General:  Well nourished, well developed, in no acute distress ?HEENT: normal ?Neck: no JVD ?Vascular: No carotid bruits; Distal pulses 2+ bilaterally   ?Cardiac:  normal S1, S2; RRR; no murmur  ?Lungs:  clear to auscultation bilaterally, no wheezing, rhonchi or rales  ?Abd: soft, nontender, no hepatomegaly  ?Ext: no edema ?Musculoskeletal:  No deformities, BUE and BLE strength normal and equal ?Skin: warm and dry  ?Neuro:  CNs 2-12 intact, no focal abnormalities noted ?Psych:  Normal affect  ? ? ?EKG:  The ECG that was done 05/04 was personally reviewed and demonstrates SR, HR 69, No acute ischemic changes. ? ?Relevant CV Studies: ? ?None previously ? ?Laboratory Data: ? ?High Sensitivity Troponin:   ?Recent Labs  ?Lab 04/10/22 ?2258 04/11/22 ?[id number] 04/11/22 ?[id number] 04/11/22 ?[id number]  ?TROPONINIHS 45* 761* 1,481* 1,365*  ?    ?Chemistry ?Recent Labs  ?Lab 04/10/22 ?2258  ?NA 137  ?K 3.7  ?CL 100  ?CO2 27  ?GLUCOSE 243*  ?BUN 11  ?CREATININE 0.74  ?CALCIUM 9.4  ?GFRNONAA >60  ?ANIONGAP 10  ?  ?No results found for: ALT, AST, GGT, ALKPHOS, BILITOT ? ?Lipids No results found for: CHOL, HDL, LDLCALC, LDLDIRECT, TRIG, CHOLHDL ? ?Hematology ?Recent Labs  ?Lab 04/10/22 ?2258  ?WBC 11.5*  ?RBC 4.99  ?HGB 13.7  ?HCT 43.3  ?MCV 86.8  ?MCH 27.5  ?MCHC 31.6  ?RDW 13.4  ?PLT 249  ? ?Thyroid No results for input(s): TSH, FREET4 in the last 168 hours. ?BNPNo results for input(s): BNP, PROBNP in the last 168 hours.  ?DDimer No results for input(s): DDIMER in the last 168 hours. ?No results found for: HGBA1C ? ? ?Radiology/Studies:  ?DG Chest 2 View ? ?Result Date: 04/10/2022 ?CLINICAL DATA:  Chest pain and heaviness EXAM: CHEST - 2 VIEW COMPARISON:  None Available. FINDINGS: Frontal and lateral views of the chest demonstrate an unremarkable cardiac silhouette. Mild elevation of the left hemidiaphragm. No airspace disease,  effusion, or pneumothorax. No acute bony abnormalities. IMPRESSION: 1. No acute intrathoracic process. Electronically Signed   By: Randa Ngo M.D.   On: 04/10/2022 23:22   ? ? ?Assessment and Plan:  ? ?NSTEMI ?- still having  some discomfort, on IV nitro ?- has HA from the nitro, give Tylenol and increase the drip, see how tolerated. ?- did not tolerate Lipitor w/ rash, continue Crestor 40 mg qd ?- pta on metoprolol 25 mg bid >> increase to 50 mg bid ?- check echo ?- Cardiac catheterization was discussed with the patient fully. The patient understands that risks include but are not limited to stroke (1 in 1000), death (1 in 82), kidney failure [usually temporary] (1 in 500), bleeding (1 in 200), allergic reaction [possibly serious] (1 in 200).  The patient understands and is willing to proceed.   ? ?2. HTN ?- increase BB as above ?- hold lisinopril for cath  ? ?3. HLD ?- ck profile, continue Crestor ? ?4. Pre-DM ?- ck A1c, follow CBGs ? ? ? ?Risk Assessment/Risk Scores:  ?  ?TIMI Risk Score for Unstable Angina or Non-ST Elevation MI:   ?The patient's TIMI risk score is 4, which indicates a 20% risk of all cause mortality, new or recurrent myocardial infarction or need for urgent revascularization in the next 14 days. ? ? ?For questions or updates, please contact [facility] ?Please consult [email or web address] for contact info under  ? ?  ?Signed, ?Rosaria Ferries, PA-C  ?04/11/2022 10:09 AM  ?As above, patient seen and examined.  Briefly she is a very pleasant 51 year old female with past medical history of prediabetes mellitus, hypertension, hyperlipidemia for evaluation of non-ST elevation myocardial infarction.  No prior cardiac history.  Last night at approximately 10 PM she developed diffuse chest pain radiating to her upper extremities bilaterally.  There was mild dyspnea but no diaphoresis.  The pain was not pleuritic.  Her pain persisted and resolved after approximately 1 to 2 hours with sublingual  nitroglycerin in the emergency room.  At present she feels a "discomfort" but symptoms not similar to her initial pain.  Troponin 1481, electrocardiogram shows normal sinus rhythm with subtle inferior latera

## 2022-04-11 NOTE — ED Notes (Signed)
[facility] Team at bedside 

## 2022-04-11 NOTE — Progress Notes (Signed)
TR-band removed without difficulty.  No hematoma or bleeding noted.  Patient re-educated regarding resting right arm for 24 hours.  Will continue to monitor. ?

## 2022-04-11 NOTE — ED Notes (Signed)
ECG reviewed with ED MD, no further orders at this time. ?

## 2022-04-11 NOTE — Progress Notes (Signed)
ANTICOAGULATION CONSULT NOTE - Initial Consult ? ?Pharmacy Consult for Heparin  ?Indication: chest pain/ACS ? ?Allergies  ?Allergen Reactions  ? Lipitor [Atorvastatin] Itching  ? ? ?Patient Measurements: ?Height: 5\' 7"  (170.2 cm) ?Weight: 93 kg (205 lb) ?IBW/kg (Calculated) : 61.6 ? ?Vital Signs: ?Temp: 98 ?F (36.7 ?C) (05/03 2255) ?Temp Source: Oral (05/03 2255) ?BP: 120/79 (05/04 0330) ?Pulse Rate: 78 (05/04 0330) ? ?Labs: ?Recent Labs  ?  04/10/22 ?2258 04/11/22 ?06/11/22  ?HGB 13.7  --   ?HCT 43.3  --   ?PLT 249  --   ?CREATININE 0.74  --   ?TROPONINIHS 45* 761*  ? ? ?Estimated Creatinine Clearance: 96.4 mL/min (by C-G formula based on SCr of 0.74 mg/dL). ? ? ?Medical History: ?Past Medical History:  ?Diagnosis Date  ? High cholesterol   ? Hypertension   ? Pre-diabetes   ? ? ?Assessment: ?51 y/o F with chest pain, found to have elevated troponin, starting heparin, CBC/renal function good, PTA meds reviewed.  ? ?Goal of Therapy:  ?Heparin level 0.3-0.7 units/ml ?Monitor platelets by anticoagulation protocol: Yes ?  ?Plan:  ?Heparin 4000 units BOLUS ?Start heparin drip at 950 units/hr ?1000 Heparin level ?Daily CBC/Heparin level ?Monitor for bleeding ? ?44, PharmD, BCPS ?Clinical Pharmacist ?Phone: [phone] ? ? ? ?

## 2022-04-11 NOTE — ED Notes (Signed)
Pt ambulatory to restroom with independent steady gait °

## 2022-04-11 NOTE — ED Provider Notes (Signed)
?[facility] EMERGENCY DEPARTMENT ?Provider Note ? ? ?CSN: [id number] ?Arrival date & time: 04/10/22  2252 ? ?  ? ?History ? ?Chief Complaint  ?Patient presents with  ? Chest Pain  ? ? ?Alicia Booth is a 51 y.o. female. ? ?51 year old female with history of hyperlipidemia, hypertension, prediabetes, obesity who presents to the ER today with chest pain.  Patient states she was at rest when she had a cute onset of chest pressure that radiated to her jaw and then subsequently to both shoulders.  Noted nausea, diaphoresis or lightheadedness.  She thought that maybe her sugar was too tight change closed and getting better.  She took some Goody's.  Called EMS and presented here for further evaluation.  No history of heart disease for her.  Pain is somewhat improved since arriving here she did have some nausea while here but no emesis.  No fevers cough no other associated symptoms.  No trauma. ? ? ?Chest Pain ? ?  ? ?Home Medications ?Prior to Admission medications   ?Medication Sig Start Date End Date Taking? Authorizing Provider  ?fluticasone (FLONASE) 50 MCG/ACT nasal spray Place into both nostrils as needed for allergies or rhinitis.    [provider]  ?lisinopril (ZESTRIL) 40 MG tablet Take by mouth daily.    [provider]  ?metFORMIN (GLUCOPHAGE-XR) 500 MG 24 hr tablet Take 500 mg by mouth daily with breakfast.    [provider]  ?metoprolol tartrate (LOPRESSOR) 25 MG tablet Take 25 mg by mouth 2 (two) times daily.    [provider]  ?rosuvastatin (CRESTOR) 40 MG tablet Take by mouth daily.    [provider]  ?   ? ?Allergies    ?Lipitor [atorvastatin]   ? ?Review of Systems   ?Review of Systems  ?Cardiovascular:  Positive for chest pain.  ? ?Physical Exam ?Updated Vital Signs ?BP 138/78   Pulse 73   Temp 98.3 ?F (36.8 ?C) (Oral)   Resp (!) 23   Ht 5\' 7"  (1.702 m)   Wt 93 kg   SpO2 97%   BMI 32.11 kg/m?  ?Physical Exam ?Vitals and nursing note  reviewed.  ?Constitutional:   ?   Appearance: She is well-developed.  ?HENT:  ?   Head: Normocephalic and atraumatic.  ?Neck:  ?   Vascular: No JVD.  ?Cardiovascular:  ?   Rate and Rhythm: Normal rate and regular rhythm.  ?Pulmonary:  ?   Effort: No respiratory distress.  ?   Breath sounds: No stridor.  ?Chest:  ?   Chest wall: No mass.  ?Abdominal:  ?   General: There is no distension.  ?   Palpations: Abdomen is soft.  ?Musculoskeletal:     ?   General: Normal range of motion.  ?   Cervical back: Normal range of motion.  ?Skin: ?   General: Skin is warm and dry.  ?Neurological:  ?   Mental Status: She is alert.  ? ? ?ED Results / Procedures / Treatments   ?Labs ?(all labs ordered are listed, but only abnormal results are displayed) ?Labs Reviewed  ?BASIC METABOLIC PANEL - Abnormal; Notable for the following components:  ?    Result Value  ? Glucose, Bld 243 (*)   ? All other components within normal limits  ?CBC - Abnormal; Notable for the following components:  ? WBC 11.5 (*)   ? All other components within normal limits  ?TROPONIN I (HIGH SENSITIVITY) - Abnormal; Notable for the  following components:  ? Troponin I (High Sensitivity) 45 (*)   ? All other components within normal limits  ?TROPONIN I (HIGH SENSITIVITY) - Abnormal; Notable for the following components:  ? Troponin I (High Sensitivity) 761 (*)   ? All other components within normal limits  ?TROPONIN I (HIGH SENSITIVITY) - Abnormal; Notable for the following components:  ? Troponin I (High Sensitivity) 1,481 (*)   ? All other components within normal limits  ?PREGNANCY, URINE  ?HEPARIN LEVEL (UNFRACTIONATED)  ?TROPONIN I (HIGH SENSITIVITY)  ? ? ?EKG ?EKG Interpretation ? ?Date/Time:  Wednesday Apr 10 2022 23:01:11 EDT ?Ventricular Rate:  69 ?PR Interval:  144 ?QRS Duration: 80 ?QT Interval:  424 ?QTC Calculation: 454 ?R Axis:   -8 ?Text Interpretation: Normal sinus rhythm Normal ECG No previous ECGs available Confirmed by Marily Memos [phone]) on  04/11/2022 1:47:32 AM ? ?Radiology ?DG Chest 2 View ? ?Result Date: 04/10/2022 ?CLINICAL DATA:  Chest pain and heaviness EXAM: CHEST - 2 VIEW COMPARISON:  None Available. FINDINGS: Frontal and lateral views of the chest demonstrate an unremarkable cardiac silhouette. Mild elevation of the left hemidiaphragm. No airspace disease, effusion, or pneumothorax. No acute bony abnormalities. IMPRESSION: 1. No acute intrathoracic process. Electronically Signed   By: Sharlet Salina M.D.   On: 04/10/2022 23:22   ? ?Procedures ?Marland KitchenCritical Care ?Performed by: Marily Memos, MD ?Authorized by: Marily Memos, MD  ? ?Critical care provider statement:  ?  Critical care time (minutes):  30 ?  Critical care was necessary to treat or prevent imminent or life-threatening deterioration of the following conditions:  Cardiac failure ?  Critical care was time spent personally by me on the following activities:  Development of treatment plan with patient or surrogate, discussions with consultants, evaluation of patient's response to treatment, examination of patient, ordering and review of laboratory studies, ordering and review of radiographic studies, ordering and performing treatments and interventions, pulse oximetry, re-evaluation of patient's condition and review of old charts  ? ? ?Medications Ordered in ED ?Medications  ?nitroGLYCERIN (NITROSTAT) SL tablet 0.4 mg (0.4 mg Sublingual Given 04/11/22 0217)  ?heparin ADULT infusion 100 units/mL (25000 units/275mL) (950 Units/hr Intravenous New Bag/Given 04/11/22 0238)  ?nitroGLYCERIN 50 mg in dextrose 5 % 250 mL (0.2 mg/mL) infusion (5 mcg/min Intravenous New Bag/Given 04/11/22 0252)  ?acetaminophen (TYLENOL) tablet 1,000 mg (0 mg Oral Hold 04/11/22 0236)  ?aspirin EC tablet 81 mg (81 mg Oral Given 04/11/22 0233)  ?heparin bolus via infusion 4,000 Units (4,000 Units Intravenous Bolus from Bag 04/11/22 0238)  ? ? ?ED Course/ Medical Decision Making/ A&P ?  ?                        ?Medical Decision  Making ?Amount and/or Complexity of Data Reviewed ?Labs: ordered. ?Radiology: ordered. ? ?Risk ?OTC drugs. ?Prescription drug management. ?Decision regarding hospitalization. ? ? ?Patient with an NSTEMI.  Heparin started, nitroglycerin started,, aspirin provided.  Pain resolved with nitroglycerin, infusion started per cardiology recommendations.  Discussed with Dr. Silver Huguenin with cardiology, will plan for admission there under Dr. Bufford Buttner. ? ?Third troponin is more elevated than the second.  Discussed with cardiology no changes at this time will need to do an ER to ER transfer if does not get a bed soon.  Discussed with bed placement and should get a bed earlier this morning. ? ?Final Clinical Impression(s) / ED Diagnoses ?Final diagnoses:  ?NSTEMI (non-ST elevated myocardial infarction) (HCC)  ? ? ?Rx / DC  Orders ?ED Discharge Orders   ? ? None  ? ?  ? ? ?  ?Marily Memos, MD ?04/11/22 [phone] ? ?

## 2022-04-11 NOTE — Progress Notes (Signed)
Responded to call from clergy colleague to visit with patient.  Visit was made and patient said she was doing fine. Provided emotional and spiritual support. Chaplain will follow as needed. ? ?Alicia Booth, [facility], Pager [phone]  ? ? ?

## 2022-04-11 NOTE — Progress Notes (Signed)
Mobility Specialist Progress Note ? ? 04/11/22 1815  ?Mobility  ?Activity Ambulated independently in hallway  ?Level of Assistance Modified independent, requires aide device or extra time  ?Assistive Device  ?(IV Pole)  ?Distance Ambulated (ft) 470 ft  ?Activity Response Tolerated well  ?$Mobility charge 1 Mobility  ? ?Post Mobility: HR, 143/94 BP, 98% SpO2 ? ?Received pt in bed having no complaints and agreeable to mobility. Asymptomatic throughout ambulation, returned back to bed w/ call bell in reach and all needs met. ? ?Holland Falling ?Mobility Specialist ?Phone Number [phone] ? ?

## 2022-04-11 NOTE — Progress Notes (Signed)
?  Echocardiogram ?2D Echocardiogram has been performed. ? ?Rodrigo Ran ?04/11/2022, 2:20 PM ?

## 2022-04-11 NOTE — ED Provider Notes (Signed)
[facility] is here to transport patient for admission.  Patient being admitted as a non-STEMI.  Patient's troponins are actually starting to drop a little bit looks like they peaked at 1481 at about 5 in the morning.  And at about 7 in the morning it was down to 1365.  Patient recently had some increased pain.  Repeated EKG.  Did not go into the system but shows no evidence of any STEMI.  Still has abnormal T waves consider ischemia diffuse leads. ? ?Patient is on a nitro drip.  Nursing has been titrating that.  Patient's vital signs are stable.  Patient stable for transport by [facility]. ?  ?Vanetta Mulders, MD ?04/11/22 [phone] ? ?

## 2022-04-11 NOTE — Interval H&P Note (Signed)
Cath Lab Visit (complete for each Cath Lab visit) ? ?Clinical Evaluation Leading to the Procedure:  ? ?ACS: Yes.   ? ?Non-ACS:   ? ?Anginal Classification: CCS IV ? ?Anti-ischemic medical therapy: Minimal Therapy (1 class of medications) ? ?Non-Invasive Test Results: No non-invasive testing performed ? ?Prior CABG: No previous CABG ? ? ? ? ? ?History and Physical Interval Note: ? ?04/11/2022 ?11:36 AM ? ?Alicia Booth  has presented today for surgery, with the diagnosis of chest pain, elevated trop.  The various methods of treatment have been discussed with the patient and family. After consideration of risks, benefits and other options for treatment, the patient has consented to  Procedure(s): ?LEFT HEART CATH AND CORONARY ANGIOGRAPHY (N/A) as a surgical intervention.  The patient's history has been reviewed, patient examined, no change in status, stable for surgery.  I have reviewed the patient's chart and labs.  Questions were answered to the patient's satisfaction.   ? ? ?[name] ? ? ?

## 2022-04-12 ENCOUNTER — Other Ambulatory Visit: Payer: Self-pay

## 2022-04-12 ENCOUNTER — Other Ambulatory Visit (HOSPITAL_COMMUNITY): Payer: Self-pay

## 2022-04-12 LAB — COMPREHENSIVE METABOLIC PANEL
ALT: 23 U/L (ref 0–44)
AST: 23 U/L (ref 15–41)
Albumin: 3.7 g/dL (ref 3.5–5.0)
Alkaline Phosphatase: 52 U/L (ref 38–126)
Anion gap: 7 (ref 5–15)
BUN: 7 mg/dL (ref 6–20)
CO2: 25 mmol/L (ref 22–32)
Calcium: 8.9 mg/dL (ref 8.9–10.3)
Chloride: 106 mmol/L (ref 98–111)
Creatinine, Ser: 0.7 mg/dL (ref 0.44–1.00)
GFR, Estimated: >60 mL/min (ref >60)
Glucose, Bld: 138 mg/dL — ABNORMAL HIGH (ref 70–99)
Potassium: 3.9 mmol/L (ref 3.5–5.1)
Sodium: 138 mmol/L (ref 135–145)
Total Bilirubin: 0.7 mg/dL (ref 0.3–1.2)
Total Protein: 6.5 g/dL (ref 6.5–8.1)

## 2022-04-12 LAB — LIPID PANEL
Cholesterol: 133 mg/dL (ref 0–200)
HDL: 38 mg/dL — ABNORMAL LOW (ref >40)
LDL Cholesterol: 64 mg/dL (ref 0–99)
Total CHOL/HDL Ratio: 3.5 RATIO
Triglycerides: 154 mg/dL — ABNORMAL HIGH (ref <150)
VLDL: 31 mg/dL (ref 0–40)

## 2022-04-12 LAB — GLUCOSE, CAPILLARY: Glucose-Capillary: 155 mg/dL — ABNORMAL HIGH (ref 70–99)

## 2022-04-12 MED ORDER — SPIRONOLACTONE 12.5 MG HALF TABLET
12.5000 mg | ORAL_TABLET | Freq: Every day | ORAL | Status: DC
  Administered 2022-04-12: 12.5 mg via ORAL
  Filled 2022-04-12: qty 1

## 2022-04-12 MED ORDER — METOPROLOL SUCCINATE ER 100 MG PO TB24
100.0000 mg | ORAL_TABLET | Freq: Every day | ORAL | 3 refills | Status: DC
  Filled 2022-04-12: qty 90, 90d supply, fill #0
  Filled 2022-07-12: qty 90, 90d supply, fill #1

## 2022-04-12 MED ORDER — LISINOPRIL 20 MG PO TABS
40.0000 mg | ORAL_TABLET | Freq: Every day | ORAL | Status: DC
  Administered 2022-04-12: 40 mg via ORAL
  Filled 2022-04-12: qty 2

## 2022-04-12 MED ORDER — NITROGLYCERIN 0.4 MG SL SUBL
0.4000 mg | SUBLINGUAL_TABLET | SUBLINGUAL | 12 refills | Status: AC | PRN
  Filled 2022-04-12: qty 25, 14d supply, fill #0

## 2022-04-12 MED ORDER — SPIRONOLACTONE 25 MG PO TABS
12.5000 mg | ORAL_TABLET | Freq: Every day | ORAL | 3 refills | Status: DC
  Filled 2022-04-12: qty 45, 90d supply, fill #0

## 2022-04-12 NOTE — Progress Notes (Signed)
Mobility Specialist Progress Note ? ? 04/12/22 1100  ?Mobility  ?Activity Ambulated independently in hallway  ?Level of Assistance Independent  ?Assistive Device None  ?Distance Ambulated (ft) 470 ft  ?Activity Response Tolerated well  ?$Mobility charge 1 Mobility  ? ?Pre Mobility: 61 HR ?During Mobility: 72 HR ?Post Mobility: 63 HR, 130/80 BP ? ?Received pt in bed w/ no complaints but expressing that there was slight discomfort in chest yesterday post ambulation. Asx throughout walk today w/o fault, returned to bed w/ call bell in reach and needs met.  ? ?Same discomfort in chest returned after ~76mns ? ?JHolland Falling?Mobility Specialist ?Phone Number [phone]? ?

## 2022-04-12 NOTE — Progress Notes (Addendum)
? ?Progress Note ? ?Patient Name: Alicia Booth ?Date of Encounter: 04/12/2022 ? ?[facility] Cardiologist: Dr Jens Som ? ?Subjective  ? ?No CP or dyspnea ? ?Inpatient Medications  ?  ?Scheduled Meds: ? acetaminophen  1,000 mg Oral Once  ? aspirin EC  81 mg Oral Daily  ? heparin  5,000 Units Subcutaneous Q8H  ? insulin aspart  0-15 Units Subcutaneous TID WC  ? insulin aspart  0-5 Units Subcutaneous QHS  ? metoprolol tartrate  50 mg Oral BID  ? rosuvastatin  40 mg Oral Daily  ? sodium chloride flush  3 mL Intravenous Q12H  ? sodium chloride flush  3 mL Intravenous Q12H  ? ?Continuous Infusions: ? sodium chloride    ? sodium chloride    ? nitroGLYCERIN 10 mcg/min (04/11/22 0736)  ? ?PRN Meds: ?sodium chloride, sodium chloride, acetaminophen, ALPRAZolam, fluticasone, magnesium hydroxide, nitroGLYCERIN, ondansetron (ZOFRAN) IV, sodium chloride flush, sodium chloride flush, zolpidem  ? ?Vital Signs  ?  ?Vitals:  ? 04/11/22 2000 04/12/22 0502 04/12/22 3220 04/12/22 2542  ?BP: (!) 150/92 131/84 (!) 147/97   ?Pulse: 69 65 65 70  ?Resp: 18 18 20    ?Temp: 98.1 ?F (36.7 ?C) 98.6 ?F (37 ?C) 98.6 ?F (37 ?C)   ?TempSrc: Oral Oral Oral   ?SpO2: 99% 97% 95%   ?Weight:  91.2 kg    ?Height:      ? ? ?Intake/Output Summary (Last 24 hours) at 04/12/2022 0951 ?Last data filed at 04/12/2022 7062 ?Gross per 24 hour  ?Intake 240 ml  ?Output 1400 ml  ?Net -1160 ml  ? ? ?  04/12/2022  ?  5:02 AM 04/11/2022  ?  8:46 AM 04/10/2022  ? 10:54 PM  ?Last 3 Weights  ?Weight (lbs) 201 lb 1 oz 202 lb 12.8 oz 205 lb  ?Weight (kg) 91.2 kg 91.989 kg 92.987 kg  ?   ? ?Telemetry  ?  ?Sinus - Personally Reviewed ? ?Physical Exam  ? ?GEN: No acute distress.   ?Neck: No JVD ?Cardiac: RRR, no murmurs, rubs, or gallops.  ?Respiratory: Clear to auscultation bilaterally. ?GI: Soft, nontender, non-distended  ?MS: No edema; radial cath site with no hematoma ?Neuro:  Nonfocal  ?Psych: Normal affect  ? ?Labs  ?  ?High Sensitivity Troponin:   ?Recent Labs  ?Lab 04/10/22 ?2258  04/11/22 ?3762 04/11/22 ?8315 04/11/22 ?1761  ?TROPONINIHS 45* 761* 1,481* 1,365*  ?   ?Chemistry ?Recent Labs  ?Lab 04/10/22 ?2258 04/11/22 ?1016 04/12/22 ?0222  ?NA 137  --  138  ?K 3.7  --  3.9  ?CL 100  --  106  ?CO2 27  --  25  ?GLUCOSE 243*  --  138*  ?BUN 11  --  7  ?CREATININE 0.74 0.65 0.70  ?CALCIUM 9.4  --  8.9  ?PROT  --   --  6.5  ?ALBUMIN  --   --  3.7  ?AST  --   --  23  ?ALT  --   --  23  ?ALKPHOS  --   --  52  ?BILITOT  --   --  0.7  ?GFRNONAA >60 >60 >60  ?ANIONGAP 10  --  7  ?  ?Lipids  ?Recent Labs  ?Lab 04/12/22 ?0222  ?CHOL 133  ?TRIG 154*  ?HDL 38*  ?LDLCALC 64  ?CHOLHDL 3.5  ?  ?Hematology ?Recent Labs  ?Lab 04/10/22 ?2258 04/11/22 ?1016  ?WBC 11.5* 10.6*  ?RBC 4.99 4.64  ?HGB 13.7 13.0  ?HCT 43.3 40.4  ?  MCV 86.8 87.1  ?MCH 27.5 28.0  ?MCHC 31.6 32.2  ?RDW 13.4 13.4  ?PLT 249 250  ? ? ?Radiology  ?  ?DG Chest 2 View ? ?Result Date: 04/10/2022 ?CLINICAL DATA:  Chest pain and heaviness EXAM: CHEST - 2 VIEW COMPARISON:  None Available. FINDINGS: Frontal and lateral views of the chest demonstrate an unremarkable cardiac silhouette. Mild elevation of the left hemidiaphragm. No airspace disease, effusion, or pneumothorax. No acute bony abnormalities. IMPRESSION: 1. No acute intrathoracic process. Electronically Signed   By: Randa Ngo M.D.   On: 04/10/2022 23:22  ? ?CARDIAC CATHETERIZATION ? ?Result Date: 04/11/2022 ?  There is moderate to severe left ventricular systolic dysfunction.   LV end diastolic pressure is moderately elevated.  LVEDP 25 mm Hg.   The left ventricular ejection fraction is 25-35% by visual estimate.   There is no aortic valve stenosis.   No angiographically apparent coronary artery disease. 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  Medical therapy along with diuresis.  ? ?ECHOCARDIOGRAM COMPLETE ? ?Result Date: 04/11/2022 ?   ECHOCARDIOGRAM REPORT   Patient Name:   [name] Date of Exam: 04/11/2022 Medical Rec #:  [id number]     Height:       67.0 in  Accession #:    [id number]    Weight:       202.8 lb Date of Birth:  28-Jan-1971      BSA:          2.034 m? Patient Age:    51 years      BP:           137/92 mmHg Patient Gender: F             HR:           60 bpm. Exam Location:  Inpatient Procedure: 2D Echo, Color Doppler and Limited Color Doppler Indications:    NSTEMI  History:        Patient has no prior history of Echocardiogram examinations.                 Risk Factors:Hypertension and Pre Diabetic and HLD.  Sonographer:    Joette Catching RCS Referring Phys: [place]  1. Left ventricular ejection fraction, by estimation, is 60 to 65%. The left ventricle has normal function. The left ventricle has no regional wall motion abnormalities. There is mild left ventricular hypertrophy. Left ventricular diastolic parameters are indeterminate.  2. Right ventricular systolic function is normal. The right ventricular size is normal. Tricuspid regurgitation signal is inadequate for assessing PA pressure.  3. The mitral valve is normal in structure. No evidence of mitral valve regurgitation.  4. The aortic valve is normal in structure. Aortic valve regurgitation is not visualized.  5. The inferior vena cava is normal in size with <50% respiratory variability, suggesting right atrial pressure of 8 mmHg. Comparison(s): No prior Echocardiogram. FINDINGS  Left Ventricle: Left ventricular ejection fraction, by estimation, is 60 to 65%. The left ventricle has normal function. The left ventricle has no regional wall motion abnormalities. The left ventricular internal cavity size was normal in size. There is  mild left ventricular hypertrophy. Left ventricular diastolic parameters are indeterminate. Right Ventricle: The right ventricular size is normal. No increase in right ventricular wall thickness. Right ventricular systolic function is normal. Tricuspid regurgitation signal is inadequate for assessing PA pressure. Left Atrium: Left atrial size was  normal in size. Right Atrium: Right atrial size  was normal in size. Pericardium: There is no evidence of pericardial effusion. Mitral Valve: The mitral valve is normal in structure. No evidence of mitral valve regurgitation. Tricuspid Valve: The tricuspid valve is normal in structure. Tricuspid valve regurgitation is not demonstrated. Aortic Valve: The aortic valve is normal in structure. Aortic valve regurgitation is not visualized. Aortic valve mean gradient measures 4.0 mmHg. Aortic valve peak gradient measures 6.2 mmHg. Aortic valve area, by VTI measures 3.04 cm?. Pulmonic Valve: The pulmonic valve was not well visualized. Pulmonic valve regurgitation is not visualized. Aorta: The aortic root and ascending aorta are structurally normal, with no evidence of dilitation. Venous: The inferior vena cava is normal in size with less than 50% respiratory variability, suggesting right atrial pressure of 8 mmHg. IAS/Shunts: No atrial level shunt detected by color flow Doppler.  LEFT VENTRICLE PLAX 2D LVIDd:         4.40 cm   Diastology LVIDs:         3.00 cm   LV e' medial:    6.15 cm/s LV PW:         1.00 cm   LV E/e' medial:  16.2 LV IVS:        1.10 cm   LV e' lateral:   10.50 cm/s LVOT diam:     2.20 cm   LV E/e' lateral: 9.5 LV SV:         89 LV SV Index:   44 LVOT Area:     3.80 cm?  RIGHT VENTRICLE             IVC RV Basal diam:  3.50 cm     IVC diam: 1.90 cm RV Mid diam:    2.30 cm RV S prime:     13.70 cm/s TAPSE (M-mode): 2.0 cm LEFT ATRIUM             Index        RIGHT ATRIUM           Index LA diam:        2.60 cm 1.28 cm/m?   RA Area:     13.90 cm? LA Vol (A2C):   90.3 ml 44.39 ml/m?  RA Volume:   36.10 ml  17.75 ml/m? LA Vol (A4C):   36.2 ml 17.80 ml/m? LA Biplane Vol: 62.7 ml 30.82 ml/m?  AORTIC VALVE                    PULMONIC VALVE AV Area (Vmax):    3.22 cm?     PV Vmax:          0.94 m/s AV Area (Vmean):   2.96 cm?     PV Peak grad:     3.5 mmHg AV Area (VTI):     3.04 cm?     PR End Diast Vel: 3.91  msec AV Vmax:           125.00 cm/s AV Vmean:          96.100 cm/s AV VTI:            0.293 m AV Peak Grad:      6.2 mmHg AV Mean Grad:      4.0 mmHg LVOT Vmax:         106.00 cm/s LVOT Vmean:        74.900 cm/

## 2022-04-12 NOTE — Discharge Summary (Signed)
?Discharge Summary  ?  ?Patient ID: Alicia Booth ?MRN: [id number]; DOB: 08/27/1971 ? ?Admit date: 04/11/2022 ?Discharge date: 04/12/2022 ? ?PCP:  Moises BloodLolayekar, Sudha D, MD ?  ?[facility] Providers ?Cardiologist:  None    ? ? ?Discharge Diagnoses  ?  ?Principal Problem: ?  NSTEMI (non-ST elevated myocardial infarction) (HCC) ? ? ? ?Diagnostic Studies/Procedures  ?  ?LHC 04/11/22 ?  There is moderate to severe left ventricular systolic dysfunction. ?  LV end diastolic pressure is moderately elevated.  LVEDP 25 mm Hg. ?  The left ventricular ejection fraction is 25-35% by visual estimate. ?  There is no aortic valve stenosis. ?  No angiographically apparent coronary artery disease. ?  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  Medical therapy along with diuresis.  ?  ?Echocardiogram 04/11/22  ?1. Left ventricular ejection fraction, by estimation, is 60 to 65%. The  ?left ventricle has normal function. The left ventricle has no regional  ?wall motion abnormalities. There is mild left ventricular hypertrophy.  ?Left ventricular diastolic parameters  ?are indeterminate.  ? 2. Right ventricular systolic function is normal. The right ventricular  ?size is normal. Tricuspid regurgitation signal is inadequate for assessing  ?PA pressure.  ? 3. The mitral valve is normal in structure. No evidence of mitral valve  ?regurgitation.  ? 4. The aortic valve is normal in structure. Aortic valve regurgitation is  ?not visualized.  ? 5. The inferior vena cava is normal in size with <50% respiratory  ?variability, suggesting right atrial pressure of 8 mmHg.  ? ?Comparison(s): No prior Echocardiogram.  ?_____________ ?  ?History of Present Illness   ?  ?Alicia Booth is a 51 y.o. female with history of CP 2018 s/p normal nuc stress test (by her description), pre-DM w/ A1c of 6.2 06/2021, HTN, HLD, FH CAD, obesity, who was seen 04/11/2022 for the evaluation of NSTEMI. ? ?Patient presented to the ER on 5/4 complaining of an  episode of chest pain the night prior while she was at rest. Pain was described as a severe pressure, radiated into both arms and up into her neck. Took 1/2 Goody powder as directed by Engineer, materialsMS operator, did not help the pain. EMS did not give patient any medicines, started on nitro drip in the ER with improvement.  ? ?Hospital Course  ?   ?Consultants: None  ? ?1 non-ST elevation myocardial infarction-cardiac catheterization results noted.  Patient has normal coronary arteries and findings suggestive of stress-induced cardiomyopathy. ?  ?2 cardiomyopathy-likely stress-induced based on catheterization results.  Will resume lisinopril.  Change Lopressor to Toprol 100 mg daily beginning tomorrow morning.  Add spironolactone 12.5 mg daily.  We will likely repeat echocardiogram in 8 to 12 weeks.  Hopefully LV function will have normalized.  If not can change [facility] to [facility] as an outpatient and consider addition of Farxiga at that time. ?  ?3 hypertension-patient's blood pressure is running high.  Resume lisinopril and continue Toprol as an outpatient.  Follow blood pressure and adjust medications as needed. ?  ?4 hyperlipidemia-continue statin.  Check lipids and liver in 8 weeks. ?  ?5 diabetes mellitus-we will hold metformin for 48 hours following catheterization and then resume preadmission dose. ? ? ?Did the patient have an acute coronary syndrome (MI, NSTEMI, STEMI, etc) this admission?:  No                               ?  Did the patient have a percutaneous coronary intervention (stent / angioplasty)?:  No.   ? ?   ?Patient seen and examined by Dr. Jens Som and deemed stable for discharge.  ? ?Patient has a follow up with general cardiology on 5/31 with APP. Also has follow up on 8/16 with Dr. Jens Som  ?  ?_____________ ? ?Discharge Vitals ?Blood pressure (!) 147/97, pulse 70, temperature 98.6 ?F (37 ?C), temperature source Oral, resp. rate 20, height 5\' 7"  (1.702 m), weight 91.2 kg, SpO2 95 %.  ?Filed Weights   ? 04/10/22 2254 04/11/22 0846 04/12/22 0502  ?Weight: 93 kg 92 kg 91.2 kg  ? ? ?Labs & Radiologic Studies  ?  ?CBC ?Recent Labs  ?  04/10/22 ?2258 04/11/22 ?1016  ?WBC 11.5* 10.6*  ?HGB 13.7 13.0  ?HCT 43.3 40.4  ?MCV 86.8 87.1  ?PLT 249 250  ? ?Basic Metabolic Panel ?Recent Labs  ?  04/10/22 ?2258 04/11/22 ?1016 04/12/22 ?0222  ?NA 137  --  138  ?K 3.7  --  3.9  ?CL 100  --  106  ?CO2 27  --  25  ?GLUCOSE 243*  --  138*  ?BUN 11  --  7  ?CREATININE 0.74 0.65 0.70  ?CALCIUM 9.4  --  8.9  ? ?Liver Function Tests ?Recent Labs  ?  04/12/22 ?0222  ?AST 23  ?ALT 23  ?ALKPHOS 52  ?BILITOT 0.7  ?PROT 6.5  ?ALBUMIN 3.7  ? ?No results for input(s): LIPASE, AMYLASE in the last 72 hours. ?High Sensitivity Troponin:   ?Recent Labs  ?Lab 04/10/22 ?2258 04/11/22 ?06/11/22 04/11/22 ?06/11/22 04/11/22 ?06/11/22  ?TROPONINIHS 45* 761* 1,481* 1,365*  ?  ?BNP ?Invalid input(s): POCBNP ?D-Dimer ?No results for input(s): DDIMER in the last 72 hours. ?Hemoglobin A1C ?Recent Labs  ?  04/11/22 ?1016  ?HGBA1C 7.1*  ? ?Fasting Lipid Panel ?Recent Labs  ?  04/12/22 ?0222  ?CHOL 133  ?HDL 38*  ?LDLCALC 64  ?TRIG 154*  ?CHOLHDL 3.5  ? ?Thyroid Function Tests ?No results for input(s): TSH, T4TOTAL, T3FREE, THYROIDAB in the last 72 hours. ? ?Invalid input(s): FREET3 ?_____________  ?DG Chest 2 View ? ?Result Date: 04/10/2022 ?CLINICAL DATA:  Chest pain and heaviness EXAM: CHEST - 2 VIEW COMPARISON:  None Available. FINDINGS: Frontal and lateral views of the chest demonstrate an unremarkable cardiac silhouette. Mild elevation of the left hemidiaphragm. No airspace disease, effusion, or pneumothorax. No acute bony abnormalities. IMPRESSION: 1. No acute intrathoracic process. Electronically Signed   By: 06/10/2022 M.D.   On: 04/10/2022 23:22  ? ?CARDIAC CATHETERIZATION ? ?Result Date: 04/11/2022 ?  There is moderate to severe left ventricular systolic dysfunction.   LV end diastolic pressure is moderately elevated.  LVEDP 25 mm Hg.   The left ventricular  ejection fraction is 25-35% by visual estimate.   There is no aortic valve stenosis.   No angiographically apparent coronary artery disease. 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  Medical therapy along with diuresis.  ? ?ECHOCARDIOGRAM COMPLETE ? ?Result Date: 04/11/2022 ?   ECHOCARDIOGRAM REPORT   Patient Name:   DILLON MCREYNOLDS Date of Exam: 04/11/2022 Medical Rec #:  06/11/2022     Height:       67.0 in Accession #:    [id number]    Weight:       202.8 lb Date of Birth:  07-15-1971      BSA:          2.034 m?  Patient Age:    50 years      BP:           137/92 mmHg Patient Gender: F             HR:           60 bpm. Exam Location:  Inpatient Procedure: 2D Echo, Color Doppler and Limited Color Doppler Indications:    NSTEMI  History:        Patient has no prior history of Echocardiogram examinations.                 Risk Factors:Hypertension and Pre Diabetic and HLD.  Sonographer:    Rodrigo Ran RCS Referring Phys: [id number] RHONDA G BARRETT IMPRESSIONS  1. Left ventricular ejection fraction, by estimation, is 60 to 65%. The left ventricle has normal function. The left ventricle has no regional wall motion abnormalities. There is mild left ventricular hypertrophy. Left ventricular diastolic parameters are indeterminate.  2. Right ventricular systolic function is normal. The right ventricular size is normal. Tricuspid regurgitation signal is inadequate for assessing PA pressure.  3. The mitral valve is normal in structure. No evidence of mitral valve regurgitation.  4. The aortic valve is normal in structure. Aortic valve regurgitation is not visualized.  5. The inferior vena cava is normal in size with <50% respiratory variability, suggesting right atrial pressure of 8 mmHg. Comparison(s): No prior Echocardiogram. FINDINGS  Left Ventricle: Left ventricular ejection fraction, by estimation, is 60 to 65%. The left ventricle has normal function. The left ventricle has no regional wall motion abnormalities.  The left ventricular internal cavity size was normal in size. There is  mild left ventricular hypertrophy. Left ventricular diastolic parameters are indeterminate. Right Ventricle: The right ventricular size is n

## 2022-04-12 NOTE — Progress Notes (Signed)
?  Transition of Care (TOC) Screening Note ? ? ?Patient Details  ?Name: Alicia Booth ?Date of Birth: [birth year] ? ? ?Transition of Care (TOC) CM/SW Contact:    ?Zenda Alpers, Lenn Sink, RN ?Phone Number: ?04/12/2022, 10:00 AM ? ? ? ?Transition of Care Department [facility]) has reviewed patient and no TOC needs have been identified at this time. We will continue to monitor patient advancement through interdisciplinary progression rounds. If new patient transition needs arise, please place a TOC consult. ?  ?

## 2022-04-12 NOTE — Progress Notes (Signed)
04/12/2022 ?11:50 AM ?Discharge AVS meds taken today and those due this evening reviewed.  Follow-up appointments and when to call md reviewed.  D/C IV and TELE.  Questions and concerns addressed.   D/C home per orders.  ?Debara Pickett C ? ?

## 2022-04-13 LAB — LIPOPROTEIN A (LPA): Lipoprotein (a): 278.4 nmol/L — ABNORMAL HIGH (ref <75.0)

## 2022-04-15 NOTE — Progress Notes (Signed)
Patient does qualify for our Ocean(a) trial. I will reach out to her and make an appointment for next month since the NSTEMI occurred on 5/4. Thank you, Ivin Booty

## 2022-04-16 ENCOUNTER — Encounter: Payer: Self-pay | Admitting: Cardiology

## 2022-04-24 ENCOUNTER — Ambulatory Visit: Payer: 59 | Admitting: Nurse Practitioner

## 2022-04-24 ENCOUNTER — Encounter: Payer: Self-pay | Admitting: Nurse Practitioner

## 2022-04-24 VITALS — BP 124/80 | HR 54 | Temp 97.8°F | Ht 67.0 in | Wt 201.0 lb

## 2022-04-24 DIAGNOSIS — N951 Menopausal and female climacteric states: Secondary | ICD-10-CM | POA: Insufficient documentation

## 2022-04-24 DIAGNOSIS — F4323 Adjustment disorder with mixed anxiety and depressed mood: Secondary | ICD-10-CM

## 2022-04-24 DIAGNOSIS — I5181 Takotsubo syndrome: Secondary | ICD-10-CM | POA: Diagnosis not present

## 2022-04-24 DIAGNOSIS — E1165 Type 2 diabetes mellitus with hyperglycemia: Secondary | ICD-10-CM

## 2022-04-24 DIAGNOSIS — I10 Essential (primary) hypertension: Secondary | ICD-10-CM | POA: Insufficient documentation

## 2022-04-24 DIAGNOSIS — Z903 Acquired absence of stomach [part of]: Secondary | ICD-10-CM | POA: Insufficient documentation

## 2022-04-24 MED ORDER — ONETOUCH VERIO VI STRP: ORAL_STRIP | 12 refills | Status: DC

## 2022-04-24 MED ORDER — MOUNJARO 2.5 MG/0.5ML ~~LOC~~ SOAJ: 2.5000 mg | SUBCUTANEOUS | 2 refills | Status: DC

## 2022-04-24 NOTE — Assessment & Plan Note (Signed)
Presented to ED via EMS with Chest pain and elevated cardiac enzymes on 04/11/2022 ?Hospitalization x 3days ?Cardiac catheterization: negative CAD ?Echo: normal EF 60-65%, mild left ventricular hypertrophy. ?appt with cardiology 05/08/2022. ?BP at goal ?LDL at goal ?Current use of metforminf for DM: hgbA1c at 7.1% ? ?

## 2022-04-24 NOTE — Assessment & Plan Note (Addendum)
Diagnosed in 2011, previous hgbA1c <6.5% ?No need for medication after gastric sleeve. ?Admits to poor diet choices and no exercise due to work schedule and care of elderly mother in poor health. ?Has gained 10lbs since gastric surgery ?She will like to switch metformin to mounjaro injection. ?We discussed possible side effects and contraindications. She verbalized understanding. ? ?Stop metformin ?Start mounjaro at 2.5mg  weekly ?F/up in 3month ?

## 2022-04-24 NOTE — Progress Notes (Signed)
? ?             Established Patient Visit ? ?Patient: Alicia Booth   DOB: 01-20-1971   51 y.o. Female  MRN: [id number] ?Visit Date: 04/24/2022 ? ?Subjective:  ?  ?Chief Complaint  ?Patient presents with  ?? Establish Care  ?  New patient/Pt would like to discuss cholesterol and DM.  ?Pt is fasting.  ?Patient states last July was the last a1c she's had that went from 6.2 to 7.1.   ? ?HPI ?Transfer from provider in [place]. ?Last appt 2021. ? ?Takotsubo cardiomyopathy ?Presented to ED via EMS with Chest pain and elevated cardiac enzymes on 04/11/2022 ?Hospitalization x 3days ?Cardiac catheterization: negative CAD ?Echo: normal EF 60-65%, mild left ventricular hypertrophy. ?appt with cardiology 05/08/2022. ?BP at goal ?LDL at goal ?Current use of metforminf for DM: hgbA1c at 7.1% ? ? ?Type 2 diabetes mellitus with hyperglycemia, without long-term current use of insulin ([place]) ?Diagnosed in 2011, previous hgbA1c <6.5% ?No need for medication after gastric sleeve. ?Admits to poor diet choices and no exercise due to work schedule and care of elderly mother in poor health. ?Has gained 10lbs since gastric surgery ?She will like to switch metformin to mounjaro injection. ?We discussed possible side effects and contraindications. She verbalized understanding. ? ?Stop metformin ?Start mounjaro at 2.5mg  weekly ?F/up in 34month ? ?Adjustment disorder with mixed anxiety and depressed mood ?Agreed to schedule appt with psychology ? ?Perimenopausal symptom ?Perimenopausal symptoms: hot flashes, mood swings ?Irregular menstrual cycle. Last 3days every other month ?Symptoms prior to cycle: breast tenderness, back pain. ?Sister with hysterectomy at age 77, unknown reason ?Mother with hysterectomy due to fibroids ?No dysmenorrhea, no clots. ?Current use of relizen to manage symptoms ? ? ?Reviewed medical, surgical, and social history today ? ?Medications: ?Outpatient Medications Prior to Visit  ?Medication Sig  ?? fluticasone (FLONASE) 50  MCG/ACT nasal spray Place into both nostrils as needed for allergies or rhinitis.  ?? lisinopril (ZESTRIL) 40 MG tablet Take 40 mg by mouth at bedtime.  ?? metFORMIN (GLUCOPHAGE-XR) 500 MG 24 hr tablet Take 500 mg by mouth at bedtime.  ?? metoprolol succinate (TOPROL XL) 100 MG 24 hr tablet Take 1 tablet (100 mg total) by mouth daily. Take with or immediately following a meal. Start medication on 5/6  ?? Multiple Vitamins-Minerals (ONE-A-DAY 50 PLUS PO) Take 1 tablet by mouth at bedtime.  ?? nitroGLYCERIN (NITROSTAT) 0.4 MG SL tablet Place 1 tablet (0.4 mg total) under the tongue every 5 (five) minutes x 3 doses as needed for chest pain.  ?? rosuvastatin (CRESTOR) 40 MG tablet Take by mouth daily.  ?? spironolactone (ALDACTONE) 25 MG tablet Take 0.5 tablets (12.5 mg total) by mouth daily.  ? ?No facility-administered medications prior to visit.  ? ?Reviewed past medical and social history.  ? ?ROS per HPI above ? ? ?   ?Objective:  ?BP 124/80 (BP Location: Left Arm, Patient Position: Sitting, Cuff Size: Large)   Pulse (!) 54   Temp 97.8 ?F (36.6 ?C) (Temporal)   Ht 5\' 7"  (1.702 m)   Wt 201 lb (91.2 kg)   LMP 03/09/2022   BMI 31.48 kg/m?  ? ?  ? ?Physical Exam ?Constitutional:   ?   Appearance: She is obese.  ?Cardiovascular:  ?   Rate and Rhythm: Normal rate.  ?   Pulses: Normal pulses.  ?Pulmonary:  ?   Effort: Pulmonary effort is normal.  ?Neurological:  ?   Mental Status: She is  alert and oriented to person, place, and time.  ?Psychiatric:     ?   Mood and Affect: Mood normal.     ?   Behavior: Behavior normal.     ?   Thought Content: Thought content normal.  ?  ?No results found for any visits on 04/24/22. ?   ?Assessment & Plan:  ?  ?Problem List Items Addressed This Visit   ? ?  ? Cardiovascular and Mediastinum  ? Takotsubo cardiomyopathy  ?  Presented to ED via EMS with Chest pain and elevated cardiac enzymes on 04/11/2022 ?Hospitalization x 3days ?Cardiac catheterization: negative CAD ?Echo: normal EF  60-65%, mild left ventricular hypertrophy. ?appt with cardiology 05/08/2022. ?BP at goal ?LDL at goal ?Current use of metforminf for DM: hgbA1c at 7.1% ? ? ?  ?  ?  ? Endocrine  ? Type 2 diabetes mellitus with hyperglycemia, without long-term current use of insulin (HCC) - Primary  ?  Diagnosed in 2011, previous hgbA1c <6.5% ?No need for medication after gastric sleeve. ?Admits to poor diet choices and no exercise due to work schedule and care of elderly mother in poor health. ?Has gained 10lbs since gastric surgery ?She will like to switch metformin to mounjaro injection. ?We discussed possible side effects and contraindications. She verbalized understanding. ? ?Stop metformin ?Start mounjaro at 2.5mg  weekly ?F/up in 53month ?  ?  ? Relevant Medications  ? glucose blood (ONETOUCH VERIO) test strip  ? tirzepatide [facility]) 2.5 MG/0.5ML Pen  ?  ? Other  ? Adjustment disorder with mixed anxiety and depressed mood  ?  Agreed to schedule appt with psychology ? ?  ?  ? ?I have spent 63mins with this patient regarding history taking, documentation, review of labs, radiology, specialty note-cardiology, formulating plan and discussing treatment options with patient.  ? ?Return in about 4 weeks (around 05/22/2022) for DM and menorrhagia. ? ?  ? ?Wilfred Lacy, NP ? ? ?

## 2022-04-24 NOTE — Assessment & Plan Note (Addendum)
Perimenopausal symptoms: hot flashes, mood swings ?Irregular menstrual cycle. Last 3days every other month ?Symptoms prior to cycle: breast tenderness, back pain. ?Sister with hysterectomy at age 53, unknown reason ?Mother with hysterectomy due to fibroids ?No dysmenorrhea, no clots. ?Current use of relizen to manage symptoms ?

## 2022-04-24 NOTE — Assessment & Plan Note (Signed)
Agreed to schedule appt with psychology ?

## 2022-04-24 NOTE — Patient Instructions (Signed)
Stop metformin ?Start mounjaro ?Monitor glucose and BP every other day in AM. ?Schedule appt with therapist. ?Sign medical release to get records from previous pcp and GYN. ? ?Thank you for choosing [facility] ?

## 2022-05-01 ENCOUNTER — Encounter: Payer: Self-pay | Admitting: Cardiology

## 2022-05-02 ENCOUNTER — Encounter: Payer: Self-pay | Admitting: Nurse Practitioner

## 2022-05-02 DIAGNOSIS — E1165 Type 2 diabetes mellitus with hyperglycemia: Secondary | ICD-10-CM

## 2022-05-02 NOTE — Telephone Encounter (Signed)
Patient said she will wait until her appointment to get blood work orders. Thank you.

## 2022-05-02 NOTE — Telephone Encounter (Signed)
Patient stated that upon d/c from hospital for NSTEMI, she was told she needs blood work before her f/u appointment. There are no orders in the chart for blood work. Would you like me to place any blood work orders for this patient before her appointment with you next week? Thank you.

## 2022-05-08 ENCOUNTER — Encounter: Payer: Self-pay | Admitting: Physician Assistant

## 2022-05-08 ENCOUNTER — Ambulatory Visit: Payer: 59 | Admitting: Physician Assistant

## 2022-05-08 VITALS — BP 120/70 | HR 60 | Ht 67.0 in | Wt 203.0 lb

## 2022-05-08 DIAGNOSIS — E1165 Type 2 diabetes mellitus with hyperglycemia: Secondary | ICD-10-CM | POA: Diagnosis not present

## 2022-05-08 DIAGNOSIS — I5181 Takotsubo syndrome: Secondary | ICD-10-CM

## 2022-05-08 DIAGNOSIS — Z79899 Other long term (current) drug therapy: Secondary | ICD-10-CM

## 2022-05-08 DIAGNOSIS — R7303 Prediabetes: Secondary | ICD-10-CM | POA: Diagnosis not present

## 2022-05-08 DIAGNOSIS — I10 Essential (primary) hypertension: Secondary | ICD-10-CM | POA: Diagnosis not present

## 2022-05-08 LAB — BASIC METABOLIC PANEL
BUN/Creatinine Ratio: 14 (ref 9–23)
BUN: 10 mg/dL (ref 6–24)
CO2: 27 mmol/L (ref 20–29)
Calcium: 9.7 mg/dL (ref 8.7–10.2)
Chloride: 103 mmol/L (ref 96–106)
Creatinine, Ser: 0.7 mg/dL (ref 0.57–1.00)
Glucose: 125 mg/dL — ABNORMAL HIGH (ref 70–99)
Potassium: 4.3 mmol/L (ref 3.5–5.2)
Sodium: 141 mmol/L (ref 134–144)
eGFR: 103 mL/min/{1.73_m2} (ref >59)

## 2022-05-08 NOTE — Progress Notes (Signed)
Cardiology Office Note:    Date:  05/08/2022   ID:  Alicia HilaWendi C Eagon, DOB 02/25/1971, MRN [id number]  PCP:  Anne NgNche, Charlotte Lum, NP  [facility] Cardiologist:  Olga MillersBrian Crenshaw, MD  [facility] Electrophysiologist:  None   Chief Complaint: hospital follow up   History of Present Illness:    Alicia Booth is a 51 y.o. female with a hx of HTN, HLD, DM FH CAD and  obesity s/p bariatric surgery in 2014 seen for hospital follow up.   Admitted 04/2022 for NSTEMI. Cath showed normal coronary arteries and findings suggestive of stress-induced cardiomyopathy with LVEF of 25-30%. However Echo showed improved LVEF of 60-65% prior to discharge. BP was running high. Metoprolol changed to Toprol. Resumed ACE. Added Cleda DaubSpiro.   Here today for follow up.  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  She also takes care of her mother has breast cancer.  She was under stress prior to hospital arrival.  She was thinking this was normal.  Since discharge she had up-and-down of her symptoms but much better.  She denies chest pain, shortness of breath, orthopnea, PND, syncope, lower extremity edema or melena.  Reports compliance with medication.  Systolic blood pressure 120-130s.  Occasionally in the 140s.  Past Medical History:  Diagnosis Date   Diabetes (HCC)    High cholesterol    Hypertension    Stress-induced cardiomyopathy     Past Surgical History:  Procedure Laterality Date   BARIATRIC SURGERY  2014   BREAST BIOPSY Left    lymph node bx benign   HEEL SPUR EXCISION     LEFT HEART CATH AND CORONARY ANGIOGRAPHY N/A 04/11/2022   Procedure: LEFT HEART CATH AND CORONARY ANGIOGRAPHY;  Surgeon: Corky CraftsVaranasi, Jayadeep S, MD;  Location: [facility];  Service: Cardiovascular;  Laterality: N/A;   MENISCUS REPAIR     tummy tuck      Current Medications: Current Meds  Medication Sig   fluticasone (FLONASE) 50 MCG/ACT nasal spray Place into both nostrils as needed for allergies or rhinitis.   glucose  blood (ONETOUCH VERIO) test strip Check glucose every other day before breakfast   lisinopril (ZESTRIL) 40 MG tablet Take 40 mg by mouth at bedtime.   metFORMIN (GLUCOPHAGE-XR) 500 MG 24 hr tablet Take 500 mg by mouth in the morning and at bedtime.   metoprolol succinate (TOPROL XL) 100 MG 24 hr tablet Take 1 tablet (100 mg total) by mouth daily. Take with or immediately following a meal. Start medication on 5/6   Misc Natural Products (RELIZEN PO) Take 2 tablets by mouth daily.   Multiple Vitamins-Minerals (ONE-A-DAY 50 PLUS PO) Take 1 tablet by mouth at bedtime.   nitroGLYCERIN (NITROSTAT) 0.4 MG SL tablet Place 1 tablet (0.4 mg total) under the tongue every 5 (five) minutes x 3 doses as needed for chest pain.   rosuvastatin (CRESTOR) 40 MG tablet Take by mouth daily.   spironolactone (ALDACTONE) 25 MG tablet Take 0.5 tablets (12.5 mg total) by mouth daily.     Allergies:   Lipitor [atorvastatin]   Social History   Socioeconomic History   Marital status: Single    Spouse name: Not on file   Number of children: Not on file   Years of education: Not on file   Highest education level: Not on file  Occupational History   Not on file  Tobacco Use   Smoking status: Never   Smokeless tobacco: Never  Vaping Use   Vaping  Use: Never used  Substance and Sexual Activity   Alcohol use: Yes    Comment: occassionally   Drug use: Never   Sexual activity: Yes  Other Topics Concern   Not on file  Social History Narrative   Not on file   Social Determinants of Health   Financial Resource Strain: Not on file  Food Insecurity: Not on file  Transportation Needs: Not on file  Physical Activity: Not on file  Stress: Not on file  Social Connections: Not on file     Family History: The patient's family history includes Breast cancer (age of onset: 42) in her mother; Heart attack (age of onset: 53) in her maternal grandmother.    ROS:   Please see the history of present illness.    All  other systems reviewed and are negative.   EKGs/Labs/Other Studies Reviewed:    The following studies were reviewed today:  Echo 04/11/2022  1. Left ventricular ejection fraction, by estimation, is 60 to 65%. The  left ventricle has normal function. The left ventricle has no regional  wall motion abnormalities. There is mild left ventricular hypertrophy.  Left ventricular diastolic parameters  are indeterminate.   2. Right ventricular systolic function is normal. The right ventricular  size is normal. Tricuspid regurgitation signal is inadequate for assessing  PA pressure.   3. The mitral valve is normal in structure. No evidence of mitral valve  regurgitation.   4. The aortic valve is normal in structure. Aortic valve regurgitation is  not visualized.   5. The inferior vena cava is normal in size with <50% respiratory  variability, suggesting right atrial pressure of 8 mmHg.   Comparison(s): No prior Echocardiogram.   Cath 04/11/22 LEFT HEART CATH AND CORONARY ANGIOGRAPHY   Conclusion      There is moderate to severe left ventricular systolic dysfunction.   LV end diastolic pressure is moderately elevated.  LVEDP 25 mm Hg.   The left ventricular ejection fraction is 25-35% by visual estimate.   There is no aortic valve stenosis.   No angiographically apparent coronary artery disease.   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  Medical therapy along with diuresis.    EKG:  EKG is not  ordered today.    Recent Labs: 04/11/2022: Hemoglobin 13.0; Platelets 250 04/12/2022: ALT 23; BUN 7; Creatinine, Ser 0.70; Potassium 3.9; Sodium 138  Recent Lipid Panel    Component Value Date/Time   CHOL 133 04/12/2022 0222   TRIG 154 (H) 04/12/2022 0222   HDL 38 (L) 04/12/2022 0222   CHOLHDL 3.5 04/12/2022 0222   VLDL 31 04/12/2022 0222   LDLCALC 64 04/12/2022 0222     Risk Assessment/Calculations:       Physical Exam:    VS:  BP 120/70   Pulse 60   Ht 5\' 7"   (1.702 m)   Wt 203 lb (92.1 kg)   SpO2 97%   BMI 31.79 kg/m     Wt Readings from Last 3 Encounters:  05/08/22 203 lb (92.1 kg)  04/24/22 201 lb (91.2 kg)  04/12/22 201 lb 1 oz (91.2 kg)     GEN:  Well nourished, well developed in no acute distress HEENT: Normal NECK: No JVD; No carotid bruits LYMPHATICS: No lymphadenopathy CARDIAC: RRR, no murmurs, rubs, gallops RESPIRATORY:  Clear to auscultation without rales, wheezing or rhonchi  ABDOMEN: Soft, non-tender, non-distended MUSCULOSKELETAL:  No edema; No deformity  SKIN: Warm and dry NEUROLOGIC:  Alert and oriented  x 3 PSYCHIATRIC:  Normal affect   ASSESSMENT AND PLAN:     Stress-induced cardiomyopathy  - Presented with CP and ruled in for NSTEMI. Cath with LVEF of 25-30%. However Echo showed improved LVEF of 60-65% prior to discharge.  No heart failure symptoms.  Continue beta-blocker, ACE and Spiriva.  2. HTN -Blood pressure relatively stable.  Continue current medical occasion.  No change.  3. HLD -04/12/2022: Cholesterol 133; HDL 38; LDL Cholesterol 64; Triglycerides 154; VLDL 31  - Continue statin   4. DM -Continue metformin.  5.  Palpitation -Very seldom.  Improved significantly after admission.  She is not interested in monitor.  She will let us know if worsening symptoms.  Continue beta-blocker.  Medication Adjustments/Labs and Tests Ordered: Current medicines are reviewed at length with the patient today.  Concerns regarding medicines are outlined above.  Orders Placed This Encounter  Procedures   Basic Metabolic Panel (BMET)   No orders of the defined types were placed in this encounter.   Patient Instructions  Medication Instructions:  Your physician recommends that you continue on your current medications as directed. Please refer to the Current Medication list given to you today.  *If you need a refill on your cardiac medications before your next appointment, please call your pharmacy*   Lab  Work: Lab work to be done today-BMP If you have labs (blood work) drawn today and your tests are completely normal, you will receive your results only by: MyChart Message (if you have MyChart) OR A paper copy in the mail If you have any lab test that is abnormal or we need to change your treatment, we will call you to review the results.   Testing/Procedures: none   Follow-Up: At [facility], you and your health needs are our priority.  As part of our continuing mission to provide you with exceptional heart care, we have created designated Provider Care Teams.  These Care Teams include your primary Cardiologist (physician) and Advanced Practice Providers (APPs -  Physician Assistants and Nurse Practitioners) who all work together to provide you with the care you need, when you need it.  We recommend signing up for the patient portal called "MyChart".  Sign up information is provided on this After Visit Summary.  MyChart is used to connect with patients for Virtual Visits (Telemedicine).  Patients are able to view lab/test results, encounter notes, upcoming appointments, etc.  Non-urgent messages can be sent to your provider as well.   To learn more about what you can do with MyChart, go to [email or web address].    Your next appointment:   July 24, 2022  The format for your next appointment:   In Person  Provider:   Olga Millers, MD    Other Instructions    Important Information About Sugar         Lorelei Pont, [place]  05/08/2022 8:23 AM    [facility]

## 2022-05-08 NOTE — Patient Instructions (Signed)
Medication Instructions:  Your physician recommends that you continue on your current medications as directed. Please refer to the Current Medication list given to you today.  *If you need a refill on your cardiac medications before your next appointment, please call your pharmacy*   Lab Work: Lab work to be done today-BMP If you have labs (blood work) drawn today and your tests are completely normal, you will receive your results only by: MyChart Message (if you have MyChart) OR A paper copy in the mail If you have any lab test that is abnormal or we need to change your treatment, we will call you to review the results.   Testing/Procedures: none   Follow-Up: At [facility], you and your health needs are our priority.  As part of our continuing mission to provide you with exceptional heart care, we have created designated Provider Care Teams.  These Care Teams include your primary Cardiologist (physician) and Advanced Practice Providers (APPs -  Physician Assistants and Nurse Practitioners) who all work together to provide you with the care you need, when you need it.  We recommend signing up for the patient portal called "MyChart".  Sign up information is provided on this After Visit Summary.  MyChart is used to connect with patients for Virtual Visits (Telemedicine).  Patients are able to view lab/test results, encounter notes, upcoming appointments, etc.  Non-urgent messages can be sent to your provider as well.   To learn more about what you can do with MyChart, go to [email or web address].    Your next appointment:   July 24, 2022  The format for your next appointment:   In Person  Provider:   Olga Millers, MD    Other Instructions    Important Information About Sugar

## 2022-05-09 NOTE — Telephone Encounter (Signed)
Pt called to f/u on PA request for [facility]. She is asking if we received request.   Please ret call [phone]  Pt also asked about Metformin order. She said it was supposed to be increased to 2/day. While waiting for [facility] PA she is wanting to have this RX sent to pharmacy.   [facility] [address] [place], [place]. AT [place] Phone:  [phone]  Fax:  [phone]

## 2022-05-10 ENCOUNTER — Other Ambulatory Visit: Payer: Self-pay

## 2022-05-10 MED ORDER — METFORMIN HCL ER 500 MG PO TB24: 500.0000 mg | ORAL_TABLET | Freq: Two times a day (BID) | ORAL | 0 refills | Status: DC

## 2022-05-10 NOTE — Telephone Encounter (Signed)
Pt advised & voiced Understanding.

## 2022-05-20 ENCOUNTER — Telehealth: Payer: Self-pay | Admitting: Nurse Practitioner

## 2022-05-20 DIAGNOSIS — E1165 Type 2 diabetes mellitus with hyperglycemia: Secondary | ICD-10-CM

## 2022-05-20 MED ORDER — OZEMPIC (0.25 OR 0.5 MG/DOSE) 2 MG/1.5ML ~~LOC~~ SOPN: PEN_INJECTOR | SUBCUTANEOUS | 2 refills | Status: DC

## 2022-05-21 NOTE — Telephone Encounter (Signed)
PA sent

## 2022-05-21 NOTE — Telephone Encounter (Signed)
Start PA for ozempic

## 2022-05-23 ENCOUNTER — Telehealth: Payer: Self-pay | Admitting: Nurse Practitioner

## 2022-05-23 ENCOUNTER — Other Ambulatory Visit: Payer: Self-pay | Admitting: Nurse Practitioner

## 2022-05-23 ENCOUNTER — Encounter: Payer: Self-pay | Admitting: Nurse Practitioner

## 2022-05-23 ENCOUNTER — Ambulatory Visit: Payer: 59 | Admitting: Nurse Practitioner

## 2022-05-23 VITALS — BP 128/88 | HR 66 | Temp 97.0°F | Ht 67.0 in | Wt 202.6 lb

## 2022-05-23 DIAGNOSIS — E1165 Type 2 diabetes mellitus with hyperglycemia: Secondary | ICD-10-CM

## 2022-05-23 DIAGNOSIS — G44229 Chronic tension-type headache, not intractable: Secondary | ICD-10-CM

## 2022-05-23 DIAGNOSIS — M65352 Trigger finger, left little finger: Secondary | ICD-10-CM | POA: Insufficient documentation

## 2022-05-23 MED ORDER — OZEMPIC (0.25 OR 0.5 MG/DOSE) 2 MG/1.5ML ~~LOC~~ SOPN: PEN_INJECTOR | SUBCUTANEOUS | 0 refills | Status: DC

## 2022-05-23 MED ORDER — PEN NEEDLES 31G X 6 MM MISC: 1.0000 "application " | 0 refills | Status: DC

## 2022-05-23 NOTE — Assessment & Plan Note (Addendum)
3x/week, tightness in occipital region and radiates to shoulders. No nausea, no change in vision, no aura. Improves with use of goody's powder, but she is concerned about frequency. 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  Advised to start neck and shoulder stretch exercise, use tylenol or advil for pain. Avoid Goody's powder due to high ASA dose and risk of GI bleed

## 2022-05-23 NOTE — Telephone Encounter (Signed)
Pt is at [facility] and the script for ozempic was sent as the same dosage as last time and it needed to be1.0. Please advise.

## 2022-05-23 NOTE — Progress Notes (Signed)
Established Patient Visit  Patient: Alicia Booth   DOB: 06-14-1971   51 y.o. Female  MRN: [id number] Visit Date: 05/23/2022  Subjective:    Chief Complaint  Patient presents with   Office Visit    F/u DM/ Menorrhagia  Headaches  No other concerns Requesting records for PAP & colonoscopy   HPI Chronic tension-type headache, not intractable 3x/week, tightness in occipital region and radiates to shoulders. No nausea, no change in vision, no aura. Improves with use of goody's powder, but she is concerned about frequency. 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  Advised to start neck and shoulder stretch exercise, use tylenol or advil for pain. Avoid Goody's powder due to high ASA dose and risk of GI bleed  Diabetes (HCC) maounjaro not approved by insurance. Switched to [employer] 0.25mg , titrate up to 0.5mg  F/up in 25months   Reviewed medical, surgical, and social history today  Medications: Outpatient Medications Prior to Visit  Medication Sig   fluticasone (FLONASE) 50 MCG/ACT nasal spray Place into both nostrils as needed for allergies or rhinitis.   glucose blood (ONETOUCH VERIO) test strip Check glucose every other day before breakfast   lisinopril (ZESTRIL) 40 MG tablet Take 40 mg by mouth at bedtime.   metFORMIN (GLUCOPHAGE-XR) 500 MG 24 hr tablet Take 1 tablet (500 mg total) by mouth in the morning and at bedtime. With food   metoprolol succinate (TOPROL XL) 100 MG 24 hr tablet Take 1 tablet (100 mg total) by mouth daily. Take with or immediately following a meal. Start medication on 5/6   Misc Natural Products (RELIZEN PO) Take 2 tablets by mouth daily.   Multiple Vitamins-Minerals (ONE-A-DAY 50 PLUS PO) Take 1 tablet by mouth at bedtime.   nitroGLYCERIN (NITROSTAT) 0.4 MG SL tablet Place 1 tablet (0.4 mg total) under the tongue every 5 (five) minutes x 3 doses as needed for chest pain.   rosuvastatin (CRESTOR) 40 MG tablet Take by mouth daily.   spironolactone  (ALDACTONE) 25 MG tablet Take 0.5 tablets (12.5 mg total) by mouth daily.   [DISCONTINUED] Semaglutide,0.25 or 0.5MG /DOS, (OZEMPIC, 0.25 OR 0.5 MG/DOSE,) 2 MG/1.5ML SOPN 0.25ng weekly x 2weeks then 0.5mg  weekly continuously (Patient not taking: Reported on 05/23/2022)   No facility-administered medications prior to visit.   Reviewed past medical and social history.   ROS per HPI above      Objective:  BP 128/88 (BP Location: Right Arm, Patient Position: Sitting, Cuff Size: Normal)   Pulse 66   Temp (!) 97 F (36.1 C) (Temporal)   Ht 5\' 7"  (1.702 m)   Wt 202 lb 9.6 oz (91.9 kg)   LMP 03/09/2022   SpO2 97%   BMI 31.73 kg/m      Physical Exam Constitutional:      Appearance: She is obese.  Cardiovascular:     Rate and Rhythm: Normal rate.     Pulses: Normal pulses.  Pulmonary:     Effort: Pulmonary effort is normal.  Abdominal:     General: Bowel sounds are normal.  Neurological:     Mental Status: She is alert and oriented to person, place, and time.  Psychiatric:        Mood and Affect: Mood normal.        Behavior: Behavior normal.     No results found for any visits on 05/23/22.    Assessment & Plan:    Problem  List Items Addressed This Visit       Endocrine   Diabetes (HCC) - Primary    maounjaro not approved by insurance. Switched to Ozempic 0.25mg , titrate up to 0.5mg  F/up in 4months      Relevant Medications   Semaglutide,0.25 or 0.5MG /DOS, (OZEMPIC, 0.25 OR 0.5 MG/DOSE,) 2 MG/1.5ML SOPN     Other   Chronic tension-type headache, not intractable    3x/week, tightness in occipital region and radiates to shoulders. No nausea, no change in vision, no aura. Improves with use of goody's powder, but she is concerned about frequency. 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  Advised to start neck and shoulder stretch exercise, use tylenol or advil for pain. Avoid Goody's powder due to high ASA dose and risk of GI bleed      Return in about 2 months (around  07/23/2022) for DM and HTN.     07/25/2022, NP

## 2022-05-23 NOTE — Telephone Encounter (Signed)
Pt advised via MyChart. 

## 2022-05-23 NOTE — Assessment & Plan Note (Signed)
maounjaro not approved by insurance. Switched to Ozempic 0.25mg , titrate up to 0.5mg  F/up in 32months

## 2022-05-23 NOTE — Patient Instructions (Addendum)
For tension headache: start neck and shoulder stretch exercise, use tylenol or advil for pain.  For trigger finger: left me know if you want referral to hand specialist.  Start ozempic as discussed.  Bring BP machines to next appt .

## 2022-07-10 NOTE — Progress Notes (Signed)
HPI: Follow-up Takotsubo cardiomyopathy.  Patient admitted May 2023 with non-ST elevation myocardial infarction.  Echocardiogram showed hypokinesis of the distal inferolateral wall and apex and mild left ventricular hypertrophy.  Cardiac catheterization showed ejection fraction 25 to 35% with no coronary disease, left ventricular end-diastolic pressure of 25 and findings felt consistent with stress-induced cardiomyopathy.  Since last seen she denies dyspnea on exertion, orthopnea, PND, pedal edema, exertional chest pain or syncope.  She feels an occasional brief tightness for several seconds in her chest not related to activities.  Current Outpatient Medications  Medication Sig Dispense Refill   fluticasone (FLONASE) 50 MCG/ACT nasal spray Place into both nostrils as needed for allergies or rhinitis.     glucose blood (ONETOUCH VERIO) test strip Check glucose every other day before breakfast 100 each 12   lisinopril (ZESTRIL) 40 MG tablet Take 1 tablet (40 mg total) by mouth at bedtime. 90 tablet 0   metoprolol succinate (TOPROL XL) 100 MG 24 hr tablet Take 1 tablet (100 mg total) by mouth daily. Take with or immediately following a meal. Start medication on 5/6 90 tablet 3   Misc Natural Products (RELIZEN PO) Take 2 tablets by mouth daily.     Multiple Vitamins-Minerals (ONE-A-DAY 50 PLUS PO) Take 1 tablet by mouth at bedtime.     nitroGLYCERIN (NITROSTAT) 0.4 MG SL tablet Place 1 tablet (0.4 mg total) under the tongue every 5 (five) minutes x 3 doses as needed for chest pain. 25 tablet 12   rosuvastatin (CRESTOR) 40 MG tablet Take by mouth daily.     Semaglutide,0.25 or 0.5MG /DOS, (OZEMPIC, 0.25 OR 0.5 MG/DOSE,) 2 MG/1.5ML SOPN 0.25mg  weekly x 2weeks then 0.5mg  weekly continuously 3 mL 0   spironolactone (ALDACTONE) 25 MG tablet Take 0.5 tablets (12.5 mg total) by mouth daily. 45 tablet 3   No current facility-administered medications for this visit.     Past Medical History:   Diagnosis Date   Diabetes (HCC)    High cholesterol    Hypertension    Stress-induced cardiomyopathy     Past Surgical History:  Procedure Laterality Date   BARIATRIC SURGERY  2014   BREAST BIOPSY Left    lymph node bx benign   HEEL SPUR EXCISION     LEFT HEART CATH AND CORONARY ANGIOGRAPHY N/A 04/11/2022   Procedure: LEFT HEART CATH AND CORONARY ANGIOGRAPHY;  Surgeon: Corky Crafts, MD;  Location: [facility];  Service: Cardiovascular;  Laterality: N/A;   MENISCUS REPAIR     tummy tuck      Social History   Socioeconomic History   Marital status: Single    Spouse name: Not on file   Number of children: Not on file   Years of education: Not on file   Highest education level: Not on file  Occupational History   Not on file  Tobacco Use   Smoking status: Never   Smokeless tobacco: Never  Vaping Use   Vaping Use: Never used  Substance and Sexual Activity   Alcohol use: Yes    Comment: occassionally   Drug use: Never   Sexual activity: Yes  Other Topics Concern   Not on file  Social History Narrative   Not on file   Social Determinants of Health   Financial Resource Strain: Not on file  Food Insecurity: Not on file  Transportation Needs: Not on file  Physical Activity: Not on file  Stress: Not on file  Social Connections: Not on  file  Intimate Partner Violence: Not on file    Family History  Problem Relation Age of Onset   Breast cancer Mother 56   Heart attack Maternal Grandmother 62    ROS: no fevers or chills, productive cough, hemoptysis, dysphasia, odynophagia, melena, hematochezia, dysuria, hematuria, rash, seizure activity, orthopnea, PND, pedal edema, claudication. Remaining systems are negative.  Physical Exam: Well-developed well-nourished in no acute distress.  Skin is warm and dry.  HEENT is normal.  Neck is supple.  Chest is clear to auscultation with normal expansion.  Cardiovascular exam is regular rate and rhythm.   Abdominal exam nontender or distended. No masses palpated. Extremities show no edema. neuro grossly intact  ECG- personally reviewed  A/P  1 Takotsubo cardiomyopathy-findings in May felt consistent with Takotsubo cardiomyopathy.  Continue beta-blocker and ACE inhibitor.  Repeat echocardiogram.  Note if LV function remains decreased we will transition ACE inhibitor to [facility] and consider addition of farxiga.  2 hypertension-patient's blood pressure is controlled.  Continue present medical regimen.  3 hyperlipidemia-continue statin.   Olga Millers, MD

## 2022-07-12 ENCOUNTER — Other Ambulatory Visit: Payer: Self-pay

## 2022-07-12 ENCOUNTER — Other Ambulatory Visit: Payer: Self-pay | Admitting: Nurse Practitioner

## 2022-07-12 MED ORDER — LISINOPRIL 40 MG PO TABS: 40.0000 mg | ORAL_TABLET | Freq: Every day | ORAL | 0 refills | Status: DC

## 2022-07-12 NOTE — Telephone Encounter (Signed)
Chart supports Rx Last OV: 05/2022 Next OV: 07/2022  

## 2022-07-15 ENCOUNTER — Other Ambulatory Visit (HOSPITAL_COMMUNITY): Payer: Self-pay

## 2022-07-24 ENCOUNTER — Encounter: Payer: Self-pay | Admitting: Cardiology

## 2022-07-24 ENCOUNTER — Ambulatory Visit: Payer: 59 | Admitting: Cardiology

## 2022-07-24 VITALS — BP 130/78 | HR 77 | Ht 67.0 in | Wt 201.8 lb

## 2022-07-24 DIAGNOSIS — I10 Essential (primary) hypertension: Secondary | ICD-10-CM | POA: Diagnosis not present

## 2022-07-24 DIAGNOSIS — I5181 Takotsubo syndrome: Secondary | ICD-10-CM

## 2022-07-24 DIAGNOSIS — E7800 Pure hypercholesterolemia, unspecified: Secondary | ICD-10-CM | POA: Diagnosis not present

## 2022-07-24 NOTE — Patient Instructions (Signed)
Medication Instructions:   Your physician recommends that you continue on your current medications as directed. Please refer to the Current Medication list given to you today.   *If you need a refill on your cardiac medications before your next appointment, please call your pharmacy*   Lab Work:  None ordered.  If you have labs (blood work) drawn today and your tests are completely normal, you will receive your results only by: MyChart Message (if you have MyChart) OR A paper copy in the mail If you have any lab test that is abnormal or we need to change your treatment, we will call you to review the results.   Testing/Procedures:  Your physician has requested that you have an echocardiogram. Echocardiography is a painless test that uses sound waves to create images of your heart. It provides your doctor with information about the size and shape of your heart and how well your heart's chambers and valves are working. This procedure takes approximately one hour. There are no restrictions for this procedure. ]   Follow-Up: At [facility], you and your health needs are our priority.  As part of our continuing mission to provide you with exceptional heart care, we have created designated Provider Care Teams.  These Care Teams include your primary Cardiologist (physician) and Advanced Practice Providers (APPs -  Physician Assistants and Nurse Practitioners) who all work together to provide you with the care you need, when you need it.  We recommend signing up for the patient portal called "MyChart".  Sign up information is provided on this After Visit Summary.  MyChart is used to connect with patients for Virtual Visits (Telemedicine).  Patients are able to view lab/test results, encounter notes, upcoming appointments, etc.  Non-urgent messages can be sent to your provider as well.   To learn more about what you can do with MyChart, go to [email or web address].    Your next appointment:    6 month(s)  The format for your next appointment:   In Person  Provider:   Olga Millers, MD    Other Instructions  Your physician wants you to follow-up in: 6 months with Dr.Crenshaw.  You will receive a reminder letter in the mail two months in advance. If you don't receive a letter, please call our office to schedule the follow-up appointment.   Important Information About Sugar

## 2022-07-25 ENCOUNTER — Ambulatory Visit: Payer: 59 | Admitting: Nurse Practitioner

## 2022-07-25 ENCOUNTER — Other Ambulatory Visit (HOSPITAL_COMMUNITY)
Admission: RE | Admit: 2022-07-25 | Discharge: 2022-07-25 | Disposition: A | Discharge status: Home / Self Care | Payer: 59 | Source: Ambulatory Visit | Attending: Nurse Practitioner | Admitting: Nurse Practitioner

## 2022-07-25 ENCOUNTER — Encounter: Payer: Self-pay | Admitting: Nurse Practitioner

## 2022-07-25 VITALS — BP 126/76 | HR 72 | Temp 96.8°F | Ht 67.0 in | Wt 201.4 lb

## 2022-07-25 DIAGNOSIS — E1165 Type 2 diabetes mellitus with hyperglycemia: Secondary | ICD-10-CM

## 2022-07-25 DIAGNOSIS — Z113 Encounter for screening for infections with a predominantly sexual mode of transmission: Secondary | ICD-10-CM | POA: Insufficient documentation

## 2022-07-25 LAB — MICROALBUMIN / CREATININE URINE RATIO
Creatinine,U: 222.4 mg/dL
Microalb Creat Ratio: 0.8 mg/g (ref 0.0–30.0)
Microalb, Ur: 1.8 mg/dL (ref 0.0–1.9)

## 2022-07-25 LAB — HEMOGLOBIN A1C: Hgb A1c MFr Bld: 6.8 % — ABNORMAL HIGH (ref 4.6–6.5)

## 2022-07-25 MED ORDER — OZEMPIC (1 MG/DOSE) 4 MG/3ML ~~LOC~~ SOPN: 1.0000 mg | PEN_INJECTOR | SUBCUTANEOUS | 0 refills | Status: DC

## 2022-07-25 NOTE — Patient Instructions (Addendum)
Go to lab Increase ozempic to 1mg  weekly Add daily eaxercise

## 2022-07-25 NOTE — Progress Notes (Signed)
Established Patient Visit  Patient: Alicia Booth   DOB: 1971/02/04   51 y.o. Female  MRN: [id number] Visit Date: 07/27/2022  Subjective:    Chief Complaint  Patient presents with   Office Visit    DM/ HTN f/u Doesn't Checks BP or BS No concerns  Ozempic concerns    HPI Diabetes (HCC) No adverse effects with ozempic 0.5mg  Normal foot exam Advised to schedule eye exam Repeat hgbA1c and urine microalbumin: negative urine protein, improved hgbA1c: 7.1 to 6.8% increase ozempic dose to 1mg . F/up in 79months  BP Readings from Last 3 Encounters:  07/25/22 126/76  07/24/22 130/78  05/23/22 128/88    Wt Readings from Last 3 Encounters:  07/25/22 201 lb 6.4 oz (91.4 kg)  07/24/22 201 lb 12.8 oz (91.5 kg)  05/23/22 202 lb 9.6 oz (91.9 kg)    Reviewed medical, surgical, and social history today  Medications: Outpatient Medications Prior to Visit  Medication Sig   fluticasone (FLONASE) 50 MCG/ACT nasal spray Place into both nostrils as needed for allergies or rhinitis.   glucose blood (ONETOUCH VERIO) test strip Check glucose every other day before breakfast   lisinopril (ZESTRIL) 40 MG tablet Take 1 tablet (40 mg total) by mouth at bedtime.   metoprolol succinate (TOPROL XL) 100 MG 24 hr tablet Take 1 tablet (100 mg total) by mouth daily. Take with or immediately following a meal. Start medication on 5/6   Misc Natural Products (RELIZEN PO) Take 2 tablets by mouth daily.   Multiple Vitamins-Minerals (ONE-A-DAY 50 PLUS PO) Take 1 tablet by mouth at bedtime.   nitroGLYCERIN (NITROSTAT) 0.4 MG SL tablet Place 1 tablet (0.4 mg total) under the tongue every 5 (five) minutes x 3 doses as needed for chest pain.   rosuvastatin (CRESTOR) 40 MG tablet Take by mouth daily.   spironolactone (ALDACTONE) 25 MG tablet Take 0.5 tablets (12.5 mg total) by mouth daily.   [DISCONTINUED] Semaglutide,0.25 or 0.5MG /DOS, (OZEMPIC, 0.25 OR 0.5 MG/DOSE,) 2 MG/1.5ML SOPN 0.25mg  weekly x 2weeks  then 0.5mg  weekly continuously   No facility-administered medications prior to visit.   Reviewed past medical and social history.   ROS per HPI above      Objective:  BP 126/76 (BP Location: Left Arm, Patient Position: Sitting, Cuff Size: Normal)   Pulse 72   Temp (!) 96.8 F (36 C) (Temporal)   Ht 5\' 7"  (1.702 m)   Wt 201 lb 6.4 oz (91.4 kg)   SpO2 95%   BMI 31.54 kg/m      Physical Exam Constitutional:      Appearance: She is obese.  Cardiovascular:     Rate and Rhythm: Normal rate and regular rhythm.     Pulses: Normal pulses.     Heart sounds: Normal heart sounds.  Pulmonary:     Effort: Pulmonary effort is normal.     Breath sounds: Normal breath sounds.  Musculoskeletal:     Right lower leg: No edema.     Left lower leg: No edema.  Neurological:     Mental Status: She is alert and oriented to person, place, and time.  Psychiatric:        Mood and Affect: Mood normal.        Behavior: Behavior normal.        Thought Content: Thought content normal.     Results for orders placed or performed in visit on 07/25/22  Hemoglobin A1c  Result Value Ref Range   Hgb A1c MFr Bld 6.8 (H) 4.6 - 6.5 %  HIV Antibody (routine testing w rflx)  Result Value Ref Range   HIV 1&2 Ab, 4th Generation NON-REACTIVE NON-REACTIVE  RPR  Result Value Ref Range   RPR Ser Ql NON-REACTIVE NON-REACTIVE  Urine microalbumin-creatinine with uACR  Result Value Ref Range   Microalb, Ur 1.8 0.0 - 1.9 mg/dL   Creatinine,U 315.4 mg/dL   Microalb Creat Ratio 0.8 0.0 - 30.0 mg/g  Urine cytology ancillary only  Result Value Ref Range   Neisseria Gonorrhea Negative    Chlamydia Negative    Trichomonas Negative    Comment Normal Reference Range Trichomonas - Negative    Comment Normal Reference Ranger Chlamydia - Negative    Comment      Normal Reference Range Neisseria Gonorrhea - Negative      Assessment & Plan:    Problem List Items Addressed This Visit       Endocrine    Diabetes (HCC) - Primary    No adverse effects with ozempic 0.5mg  Normal foot exam Advised to schedule eye exam Repeat hgbA1c and urine microalbumin: negative urine protein, improved hgbA1c: 7.1 to 6.8% increase ozempic dose to 1mg . F/up in 28months       Relevant Medications   Semaglutide, 1 MG/DOSE, (OZEMPIC, 1 MG/DOSE,) 4 MG/3ML SOPN   Other Relevant Orders   Hemoglobin A1c (Completed)   Urine microalbumin-creatinine with uACR (Completed)   Other Visit Diagnoses     Screen for STD (sexually transmitted disease)       Relevant Orders   HIV Antibody (routine testing w rflx) (Completed)   RPR (Completed)   Urine cytology ancillary only (Completed)      Return in about 3 months (around 10/25/2022) for CPE (fasting).     10/27/2022, NP

## 2022-07-26 LAB — URINE CYTOLOGY ANCILLARY ONLY
Chlamydia: NEGATIVE
Comment: NEGATIVE
Comment: NEGATIVE
Comment: NORMAL
Neisseria Gonorrhea: NEGATIVE
Trichomonas: NEGATIVE

## 2022-07-26 LAB — RPR: RPR Ser Ql: NONREACTIVE

## 2022-07-26 LAB — HIV ANTIBODY (ROUTINE TESTING W REFLEX): HIV 1&2 Ab, 4th Generation: NONREACTIVE

## 2022-07-27 NOTE — Assessment & Plan Note (Addendum)
No adverse effects with ozempic 0.5mg  Normal foot exam Advised to schedule eye exam Repeat hgbA1c and urine microalbumin: negative urine protein, improved hgbA1c: 7.1 to 6.8% increase ozempic dose to 1mg . F/up in 36months

## 2022-08-07 ENCOUNTER — Ambulatory Visit (HOSPITAL_BASED_OUTPATIENT_CLINIC_OR_DEPARTMENT_OTHER)
Admission: RE | Admit: 2022-08-07 | Discharge: 2022-08-07 | Disposition: A | Discharge status: Home / Self Care | Payer: 59 | Source: Ambulatory Visit | Attending: Cardiology | Admitting: Cardiology

## 2022-08-07 DIAGNOSIS — I10 Essential (primary) hypertension: Secondary | ICD-10-CM | POA: Insufficient documentation

## 2022-08-07 DIAGNOSIS — E7800 Pure hypercholesterolemia, unspecified: Secondary | ICD-10-CM | POA: Diagnosis not present

## 2022-08-07 DIAGNOSIS — I517 Cardiomegaly: Secondary | ICD-10-CM

## 2022-08-07 DIAGNOSIS — I5030 Unspecified diastolic (congestive) heart failure: Secondary | ICD-10-CM | POA: Diagnosis not present

## 2022-08-07 DIAGNOSIS — I5181 Takotsubo syndrome: Secondary | ICD-10-CM | POA: Diagnosis present

## 2022-08-07 LAB — ECHOCARDIOGRAM COMPLETE
AR max vel: 2.23 cm2
AV Area VTI: 2.3 cm2
AV Area mean vel: 2.03 cm2
AV Mean grad: 4 mmHg
AV Peak grad: 8 mmHg
Ao pk vel: 1.41 m/s
Area-P 1/2: 2.97 cm2
S' Lateral: 2.8 cm

## 2022-08-07 NOTE — Progress Notes (Signed)
  Echocardiogram 2D Echocardiogram has been performed.  Alicia Booth F 08/07/2022, 2:07 PM

## 2022-08-13 ENCOUNTER — Encounter: Payer: Self-pay | Admitting: *Deleted

## 2022-08-15 ENCOUNTER — Other Ambulatory Visit: Payer: Self-pay | Admitting: Nurse Practitioner

## 2022-08-15 DIAGNOSIS — E1165 Type 2 diabetes mellitus with hyperglycemia: Secondary | ICD-10-CM

## 2022-08-16 NOTE — Telephone Encounter (Signed)
Chart supports Rx Last OV: 07/2022 Next OV: 10/2022  

## 2022-08-21 ENCOUNTER — Other Ambulatory Visit: Payer: Self-pay | Admitting: Nurse Practitioner

## 2022-08-21 DIAGNOSIS — E1165 Type 2 diabetes mellitus with hyperglycemia: Secondary | ICD-10-CM

## 2022-09-19 ENCOUNTER — Other Ambulatory Visit: Payer: Self-pay | Admitting: Nurse Practitioner

## 2022-09-19 DIAGNOSIS — E1165 Type 2 diabetes mellitus with hyperglycemia: Secondary | ICD-10-CM

## 2022-09-23 ENCOUNTER — Telehealth: Payer: Self-pay | Admitting: Nurse Practitioner

## 2022-09-23 ENCOUNTER — Other Ambulatory Visit: Payer: Self-pay | Admitting: Nurse Practitioner

## 2022-09-23 ENCOUNTER — Encounter: Payer: Self-pay | Admitting: Nurse Practitioner

## 2022-09-23 DIAGNOSIS — E1165 Type 2 diabetes mellitus with hyperglycemia: Secondary | ICD-10-CM

## 2022-09-23 MED ORDER — OZEMPIC (1 MG/DOSE) 4 MG/3ML ~~LOC~~ SOPN: 1.0000 mg | PEN_INJECTOR | SUBCUTANEOUS | 0 refills | Status: DC

## 2022-09-23 NOTE — Telephone Encounter (Signed)
Caller Name: Lismary Call back phone #: [phone]   MEDICATION(S):  rosuvastatin (CRESTOR) 40 MG tablet, and  Ozempic (1 MG/DOSE)  Days of Med Remaining:   Has the patient contacted their pharmacy (YES/NO)? no What did pharmacy advise?   Preferred Pharmacy:  [facility] [address] [place], [place]. AT [place] Phone:  [phone]      ~~~Please advise patient/caregiver to allow 2-3 business days to process RX refills.   Pt stated she is going out of town and need the medication

## 2022-09-23 NOTE — Telephone Encounter (Signed)
Waiting for providers response.

## 2022-09-24 ENCOUNTER — Other Ambulatory Visit: Payer: Self-pay

## 2022-09-24 MED ORDER — ROSUVASTATIN CALCIUM 40 MG PO TABS: 40.0000 mg | ORAL_TABLET | Freq: Every day | ORAL | 2 refills | Status: DC

## 2022-10-18 ENCOUNTER — Telehealth: Payer: Self-pay | Admitting: Nurse Practitioner

## 2022-10-18 DIAGNOSIS — E1165 Type 2 diabetes mellitus with hyperglycemia: Secondary | ICD-10-CM

## 2022-10-18 MED ORDER — OZEMPIC (1 MG/DOSE) 4 MG/3ML ~~LOC~~ SOPN: 1.0000 mg | PEN_INJECTOR | SUBCUTANEOUS | 0 refills | Status: DC

## 2022-10-18 NOTE — Telephone Encounter (Signed)
Pt advised.

## 2022-10-18 NOTE — Telephone Encounter (Signed)
Caller Name: Alicia Booth Call back phone #: [phone]  Reason for Call: Pt believes she is supposed to receive a higher dose of the Ozempic (2.0). Can this please get sent in to the   [facility] [address] - [place], [place] - [address] [place]. AT [facility] [address]  Phone: [phone]  Fax: [phone]

## 2022-10-20 ENCOUNTER — Other Ambulatory Visit: Payer: Self-pay | Admitting: Nurse Practitioner

## 2022-10-21 MED ORDER — LISINOPRIL 40 MG PO TABS: 40.0000 mg | ORAL_TABLET | Freq: Every day | ORAL | 0 refills | Status: DC

## 2022-10-21 NOTE — Telephone Encounter (Signed)
Chart supports Rx Last OV: 07/2022 Next OV: 10/2022  

## 2022-10-24 ENCOUNTER — Encounter: Payer: Self-pay | Admitting: Nurse Practitioner

## 2022-10-24 ENCOUNTER — Ambulatory Visit (INDEPENDENT_AMBULATORY_CARE_PROVIDER_SITE_OTHER): Payer: 59 | Admitting: Nurse Practitioner

## 2022-10-24 ENCOUNTER — Other Ambulatory Visit (HOSPITAL_COMMUNITY)
Admission: RE | Admit: 2022-10-24 | Discharge: 2022-10-24 | Disposition: A | Discharge status: Home / Self Care | Payer: 59 | Source: Ambulatory Visit | Attending: Nurse Practitioner | Admitting: Nurse Practitioner

## 2022-10-24 VITALS — BP 126/96 | HR 63 | Temp 96.9°F | Ht 67.0 in | Wt 197.2 lb

## 2022-10-24 DIAGNOSIS — E1165 Type 2 diabetes mellitus with hyperglycemia: Secondary | ICD-10-CM

## 2022-10-24 DIAGNOSIS — E1169 Type 2 diabetes mellitus with other specified complication: Secondary | ICD-10-CM | POA: Diagnosis not present

## 2022-10-24 DIAGNOSIS — Z124 Encounter for screening for malignant neoplasm of cervix: Secondary | ICD-10-CM | POA: Diagnosis present

## 2022-10-24 DIAGNOSIS — F4323 Adjustment disorder with mixed anxiety and depressed mood: Secondary | ICD-10-CM

## 2022-10-24 DIAGNOSIS — E785 Hyperlipidemia, unspecified: Secondary | ICD-10-CM | POA: Diagnosis not present

## 2022-10-24 DIAGNOSIS — Z0001 Encounter for general adult medical examination with abnormal findings: Secondary | ICD-10-CM | POA: Diagnosis not present

## 2022-10-24 DIAGNOSIS — N761 Subacute and chronic vaginitis: Secondary | ICD-10-CM

## 2022-10-24 LAB — COMPREHENSIVE METABOLIC PANEL
ALT: 14 U/L (ref 0–35)
AST: 16 U/L (ref 0–37)
Albumin: 4.3 g/dL (ref 3.5–5.2)
Alkaline Phosphatase: 53 U/L (ref 39–117)
BUN: 9 mg/dL (ref 6–23)
CO2: 31 mEq/L (ref 19–32)
Calcium: 9.3 mg/dL (ref 8.4–10.5)
Chloride: 103 mEq/L (ref 96–112)
Creatinine, Ser: 0.68 mg/dL (ref 0.40–1.20)
GFR: 99.36 mL/min (ref >60.00)
Glucose, Bld: 98 mg/dL (ref 70–99)
Potassium: 4 mEq/L (ref 3.5–5.1)
Sodium: 139 mEq/L (ref 135–145)
Total Bilirubin: 0.6 mg/dL (ref 0.2–1.2)
Total Protein: 7.4 g/dL (ref 6.0–8.3)

## 2022-10-24 LAB — CBC
HCT: 43.1 % (ref 36.0–46.0)
Hemoglobin: 14.1 g/dL (ref 12.0–15.0)
MCHC: 32.7 g/dL (ref 30.0–36.0)
MCV: 86.2 fl (ref 78.0–100.0)
Platelets: 255 10*3/uL (ref 150.0–400.0)
RBC: 5 Mil/uL (ref 3.87–5.11)
RDW: 14.4 % (ref 11.5–15.5)
WBC: 9 10*3/uL (ref 4.0–10.5)

## 2022-10-24 LAB — LIPID PANEL
Cholesterol: 139 mg/dL (ref 0–200)
HDL: 43.7 mg/dL (ref >39.00)
LDL Cholesterol: 70 mg/dL (ref 0–99)
NonHDL: 95.46
Total CHOL/HDL Ratio: 3
Triglycerides: 127 mg/dL (ref 0.0–149.0)
VLDL: 25.4 mg/dL (ref 0.0–40.0)

## 2022-10-24 LAB — HEMOGLOBIN A1C: Hgb A1c MFr Bld: 6.1 % (ref 4.6–6.5)

## 2022-10-24 LAB — HM DIABETES EYE EXAM

## 2022-10-24 MED ORDER — METRONIDAZOLE 0.75 % VA GEL: 1.0000 | Freq: Every day | VAGINAL | 0 refills | Status: DC

## 2022-10-24 NOTE — Progress Notes (Signed)
Complete physical exam  Patient: Alicia Booth   DOB: 04-11-1971   51 y.o. Female  MRN: [id number] Visit Date: 10/24/2022  Subjective:    Chief Complaint  Patient presents with   Annual Exam    CPE, PAP Pt fasting  C/o of both hands/ finger pain & numbness only at night    Alicia Booth is a 51 y.o. female who presents today for a complete physical exam. She reports consuming a general diet.  No exercise regimen  She generally feels fairly well. She reports sleeping poorly. She does have additional problems to discuss today.  Vision:No Dental:Yes STD Screen:No  Most recent fall risk assessment:     No data to display         Most recent depression screenings:    10/24/2022   10:42 AM 04/24/2022   11:50 AM  Depression screen PHQ 2/9  Decreased Interest 1 1  Down, Depressed, Hopeless 2 2  PHQ - 2 Score 3 3  Altered sleeping 3 2  Tired, decreased energy 3 3  Change in appetite 0 1  Feeling bad or failure about yourself  0 0  Trouble concentrating 1 0  Moving slowly or fidgety/restless 0 0  Suicidal thoughts 0 0  PHQ-9 Score 10 9  Difficult doing work/chores Somewhat difficult        10/24/2022   10:43 AM 04/24/2022   11:50 AM  GAD 7 : Generalized Anxiety Score  Nervous, Anxious, on Edge 2 2  Control/stop worrying 3 2  Worry too much - different things 3 2  Trouble relaxing 3 2  Restless 2 1  Easily annoyed or irritable 1 0  Afraid - awful might happen 0 0  Total GAD 7 Score 14 9  Anxiety Difficulty Somewhat difficult Somewhat difficult   HPI  Chronic vaginitis Recurrent vaginal discharge, No itching or dysuria or rash. Negative STD screen Sent metrogel x 7days Advised to also use boric acid suppository once a week.  Adjustment disorder with mixed anxiety and depressed mood Decline need for medication Plans to schedule appt with psychologist  Past Medical History:  Diagnosis Date   Diabetes (HCC)    High cholesterol    Hypertension     Stress-induced cardiomyopathy    Past Surgical History:  Procedure Laterality Date   BARIATRIC SURGERY  2014   BREAST BIOPSY Left    lymph node bx benign   HEEL SPUR EXCISION     LEFT HEART CATH AND CORONARY ANGIOGRAPHY N/A 04/11/2022   Procedure: LEFT HEART CATH AND CORONARY ANGIOGRAPHY;  Surgeon: Corky Crafts, MD;  Location: [facility];  Service: Cardiovascular;  Laterality: N/A;   MENISCUS REPAIR     tummy tuck     Social History   Socioeconomic History   Marital status: Single    Spouse name: Not on file   Number of children: Not on file   Years of education: Not on file   Highest education level: Not on file  Occupational History   Not on file  Tobacco Use   Smoking status: Never   Smokeless tobacco: Never  Vaping Use   Vaping Use: Never used  Substance and Sexual Activity   Alcohol use: Yes    Comment: occassionally   Drug use: Never   Sexual activity: Yes  Other Topics Concern   Not on file  Social History Narrative   Not on file   Social Determinants of Health   Financial Resource Strain:  Not on file  Food Insecurity: Not on file  Transportation Needs: Not on file  Physical Activity: Not on file  Stress: Not on file  Social Connections: Not on file  Intimate Partner Violence: Not on file   Family Status  Relation Name Status   Mother  (Not Specified)   MGM  Deceased   Family History  Problem Relation Age of Onset   Breast cancer Mother 13   Heart attack Maternal Grandmother 7   Allergies  Allergen Reactions   Lipitor [Atorvastatin] Itching    Patient Care Team: Mehtab Dolberry, Bonna Gains, NP as PCP - General (Internal Medicine) Jens Som, Madolyn Frieze, MD as PCP - Cardiology (Cardiology) Moises Blood, MD as Referring Physician (Internal Medicine)   Medications: Outpatient Medications Prior to Visit  Medication Sig   fluticasone (FLONASE) 50 MCG/ACT nasal spray Place into both nostrils as needed for allergies or rhinitis.   glucose  blood (ONETOUCH VERIO) test strip Check glucose every other day before breakfast   lisinopril (ZESTRIL) 40 MG tablet Take 1 tablet (40 mg total) by mouth at bedtime.   metoprolol succinate (TOPROL XL) 100 MG 24 hr tablet Take 1 tablet (100 mg total) by mouth daily. Take with or immediately following a meal. Start medication on 5/6   Misc Natural Products (RELIZEN PO) Take 2 tablets by mouth daily.   Multiple Vitamins-Minerals (ONE-A-DAY 50 PLUS PO) Take 1 tablet by mouth at bedtime.   nitroGLYCERIN (NITROSTAT) 0.4 MG SL tablet Place 1 tablet (0.4 mg total) under the tongue every 5 (five) minutes x 3 doses as needed for chest pain.   rosuvastatin (CRESTOR) 40 MG tablet Take 1 tablet (40 mg total) by mouth daily.   Semaglutide, 1 MG/DOSE, (OZEMPIC, 1 MG/DOSE,) 4 MG/3ML SOPN Inject 1 mg into the skin once a week.   spironolactone (ALDACTONE) 25 MG tablet Take 0.5 tablets (12.5 mg total) by mouth daily.   No facility-administered medications prior to visit.   Review of Systems  Constitutional:  Negative for fever.  HENT:  Negative for congestion and sore throat.   Eyes:        Negative for visual changes  Respiratory:  Negative for cough and shortness of breath.   Cardiovascular:  Negative for chest pain, palpitations and leg swelling.  Gastrointestinal:  Negative for blood in stool, constipation and diarrhea.  Genitourinary:  Negative for dysuria, frequency and urgency.  Musculoskeletal:  Negative for myalgias.  Skin:  Negative for rash.  Neurological:  Negative for dizziness and headaches.  Hematological:  Does not bruise/bleed easily.  Psychiatric/Behavioral:  Positive for dysphoric mood and sleep disturbance. Negative for self-injury and suicidal ideas. The patient is not nervous/anxious.        Objective:  BP (!) 126/96 (BP Location: Right Arm, Patient Position: Sitting, Cuff Size: Normal)   Pulse 63   Temp (!) 96.9 F (36.1 C) (Temporal)   Ht 5\' 7"  (1.702 m)   Wt 197 lb 3.2 oz  (89.4 kg)   SpO2 (!) 83%   BMI 30.89 kg/m     BP Readings from Last 3 Encounters:  10/24/22 (!) 126/96  07/25/22 126/76  07/24/22 130/78    Wt Readings from Last 3 Encounters:  10/24/22 197 lb 3.2 oz (89.4 kg)  07/25/22 201 lb 6.4 oz (91.4 kg)  07/24/22 201 lb 12.8 oz (91.5 kg)    Physical Exam Vitals reviewed. Exam conducted with a chaperone present.  Constitutional:      General: She is not in  acute distress. HENT:     Right Ear: External ear normal. There is impacted cerumen.     Left Ear: External ear normal. There is impacted cerumen.     Nose: Nose normal.  Eyes:     General: No scleral icterus.    Extraocular Movements: Extraocular movements intact.     Conjunctiva/sclera: Conjunctivae normal.  Neck:     Thyroid: No thyromegaly.  Cardiovascular:     Rate and Rhythm: Normal rate.     Heart sounds: Normal heart sounds.  Pulmonary:     Effort: Pulmonary effort is normal.     Breath sounds: Normal breath sounds.  Chest:     Chest wall: No tenderness.  Breasts:    Breasts are symmetrical.     Right: Normal.     Left: Normal.  Abdominal:     General: Bowel sounds are normal. There is no distension.     Palpations: Abdomen is soft.     Tenderness: There is no abdominal tenderness.  Genitourinary:    General: Normal vulva.     Exam position: Lithotomy position.     Labia:        Right: Lesion present. No rash, tenderness or injury.        Left: No rash, tenderness, lesion or injury.      Vagina: Vaginal discharge present. No erythema or tenderness.     Cervix: Normal.     Uterus: Normal.      Adnexa: Right adnexa normal.       Comments: Bartholin cyst Musculoskeletal:        General: No tenderness. Normal range of motion.     Cervical back: Normal range of motion and neck supple.     Right lower leg: No edema.     Left lower leg: No edema.  Lymphadenopathy:     Cervical: No cervical adenopathy.     Upper Body:     Right upper body: No supraclavicular,  axillary or pectoral adenopathy.     Left upper body: No supraclavicular, axillary or pectoral adenopathy.     Lower Body: No right inguinal adenopathy. No left inguinal adenopathy.  Skin:    General: Skin is warm and dry.  Neurological:     Mental Status: She is alert and oriented to person, place, and time.  Psychiatric:        Mood and Affect: Mood normal.        Behavior: Behavior normal.        Thought Content: Thought content normal.        Judgment: Judgment normal.     No results found for any visits on 10/24/22.    Assessment & Plan:    Routine Health Maintenance and Physical Exam  Immunization History  Administered Date(s) Administered   Influenza Inj Mdck Quad Pf 09/21/2015   Influenza, Seasonal, Injecte, Preservative Fre 08/17/2010, 09/14/2011, 08/18/2012, 08/23/2013, 09/01/2014   Influenza,inj,Quad PF,6+ Mos 08/30/2020   Influenza,inj,quad, With Preservative 08/09/2016, 09/05/2017, 08/13/2018, 10/12/2019   Influenza-Unspecified 08/17/2010, 09/14/2011, 08/18/2012, 08/23/2013, 09/01/2014, 10/03/2022   Moderna Sars-Covid-2 Vaccination 02/28/2020, 03/27/2020, 12/13/2020   Pfizer Covid-19 Vaccine Bivalent Booster 44yrs & up 10/03/2022   Pneumococcal Polysaccharide-23 11/20/2009   Tdap 11/01/2009, 08/09/2020   Zoster Recombinat (Shingrix) 06/19/2020, 08/30/2020   Health Maintenance  Topic Date Due   OPHTHALMOLOGY EXAM  Never done   PAP SMEAR-Modifier  09/17/2020   COVID-19 Vaccine (5 - Moderna risk series) 11/28/2022   HEMOGLOBIN A1C  01/25/2023   Diabetic kidney evaluation -  GFR measurement  05/09/2023   Diabetic kidney evaluation - Urine ACR  07/26/2023   FOOT EXAM  07/26/2023   MAMMOGRAM  02/21/2024   COLONOSCOPY (Pts 45-3738yrs Insurance coverage will need to be confirmed)  04/19/2029   TETANUS/TDAP  08/09/2030   INFLUENZA VACCINE  Completed   Hepatitis C Screening  Completed   HIV Screening  Completed   Zoster Vaccines- Shingrix  Completed   HPV VACCINES   Aged Out   Discussed health benefits of physical activity, and encouraged her to engage in regular exercise appropriate for her age and condition.  Problem List Items Addressed This Visit       Endocrine   Diabetes (HCC)   Relevant Orders   Hemoglobin A1c   Hyperlipidemia associated with type 2 diabetes mellitus (HCC)   Relevant Orders   Lipid panel     Genitourinary   Chronic vaginitis    Recurrent vaginal discharge, No itching or dysuria or rash. Negative STD screen Sent metrogel x 7days Advised to also use boric acid suppository once a week.      Relevant Medications   metroNIDAZOLE (METROGEL) 0.75 % vaginal gel     Other   Adjustment disorder with mixed anxiety and depressed mood    Decline need for medication Plans to schedule appt with psychologist      Other Visit Diagnoses     Encounter for preventative adult health care exam with abnormal findings    -  Primary   Relevant Orders   Comprehensive metabolic panel   CBC   Encounter for Papanicolaou smear for cervical cancer screening       Relevant Orders   Cytology - PAP      Return in about 3 months (around 01/24/2023) for DM, hyperlipidemia (fasting).     Alysia Pennaharlotte Orey Moure, NP

## 2022-10-24 NOTE — Assessment & Plan Note (Addendum)
Recurrent vaginal discharge, No itching or dysuria or rash. Negative STD screen Sent metrogel x 7days Advised to also use boric acid suppository once a week.

## 2022-10-24 NOTE — Patient Instructions (Addendum)
Go to lab [facility] to use boric acid vaginal insert  [email or web address] or therapy for black girls website Aquilla Solian: [phone]  Schedule appt for ear wax removal. Use debrox solution prior appt.  Schedule appt for diabetic eye exam  Preventive Care 53-53 Years Old, Female Preventive care refers to lifestyle choices and visits with your health care provider that can promote health and wellness. Preventive care visits are also called wellness exams. What can I expect for my preventive care visit? Counseling Your health care provider may ask you questions about your: Medical history, including: Past medical problems. Family medical history. Pregnancy history. Current health, including: Menstrual cycle. Method of birth control. Emotional well-being. Home life and relationship well-being. Sexual activity and sexual health. Lifestyle, including: Alcohol, nicotine or tobacco, and drug use. Access to firearms. Diet, exercise, and sleep habits. Work and work Statistician. Sunscreen use. Safety issues such as seatbelt and bike helmet use. Physical exam Your health care provider will check your: Height and weight. These may be used to calculate your BMI (body mass index). BMI is a measurement that tells if you are at a healthy weight. Waist circumference. This measures the distance around your waistline. This measurement also tells if you are at a healthy weight and may help predict your risk of certain diseases, such as type 2 diabetes and high blood pressure. Heart rate and blood pressure. Body temperature. Skin for abnormal spots. What immunizations do I need?  Vaccines are usually given at various ages, according to a schedule. Your health care provider will recommend vaccines for you based on your age, medical history, and lifestyle or other factors, such as travel or where you work. What tests do I need? Screening Your health care provider may recommend screening tests for  certain conditions. This may include: Lipid and cholesterol levels. Diabetes screening. This is done by checking your blood sugar (glucose) after you have not eaten for a while (fasting). Pelvic exam and Pap test. Hepatitis B test. Hepatitis C test. HIV (human immunodeficiency virus) test. STI (sexually transmitted infection) testing, if you are at risk. Lung cancer screening. Colorectal cancer screening. Mammogram. Talk with your health care provider about when you should start having regular mammograms. This may depend on whether you have a family history of breast cancer. BRCA-related cancer screening. This may be done if you have a family history of breast, ovarian, tubal, or peritoneal cancers. Bone density scan. This is done to screen for osteoporosis. Talk with your health care provider about your test results, treatment options, and if necessary, the need for more tests. Follow these instructions at home: Eating and drinking  Eat a diet that includes fresh fruits and vegetables, whole grains, lean protein, and low-fat dairy products. Take vitamin and mineral supplements as recommended by your health care provider. Do not drink alcohol if: Your health care provider tells you not to drink. You are pregnant, may be pregnant, or are planning to become pregnant. If you drink alcohol: Limit how much you have to 0-1 drink a day. Know how much alcohol is in your drink. In the [place] one drink equals one 12 oz bottle of beer (355 mL), one 5 oz glass of wine (148 mL), or one 1 oz glass of hard liquor (44 mL). Lifestyle Brush your teeth every morning and night with fluoride toothpaste. Floss one time each day. Exercise for at least 30 minutes 5 or more days each week. Do not use any products that contain nicotine or tobacco.  These products include cigarettes, chewing tobacco, and vaping devices, such as e-cigarettes. If you need help quitting, ask your health care provider. Do not use  drugs. If you are sexually active, practice safe sex. Use a condom or other form of protection to prevent STIs. If you do not wish to become pregnant, use a form of birth control. If you plan to become pregnant, see your health care provider for a prepregnancy visit. Take aspirin only as told by your health care provider. Make sure that you understand how much to take and what form to take. Work with your health care provider to find out whether it is safe and beneficial for you to take aspirin daily. Find healthy ways to manage stress, such as: Meditation, yoga, or listening to music. Journaling. Talking to a trusted person. Spending time with friends and family. Minimize exposure to UV radiation to reduce your risk of skin cancer. Safety Always wear your seat belt while driving or riding in a vehicle. Do not drive: If you have been drinking alcohol. Do not ride with someone who has been drinking. When you are tired or distracted. While texting. If you have been using any mind-altering substances or drugs. Wear a helmet and other protective equipment during sports activities. If you have firearms in your house, make sure you follow all gun safety procedures. Seek help if you have been physically or sexually abused. What's next? Visit your health care provider once a year for an annual wellness visit. Ask your health care provider how often you should have your eyes and teeth checked. Stay up to date on all vaccines. This information is not intended to replace advice given to you by your health care provider. Make sure you discuss any questions you have with your health care provider. Document Revised: 05/23/2021 Document Reviewed: 05/23/2021 Elsevier Patient Education  [place].

## 2022-10-24 NOTE — Assessment & Plan Note (Signed)
Decline need for medication Plans to schedule appt with psychologist

## 2022-10-25 LAB — CYTOLOGY - PAP
Adequacy: ABSENT
Comment: NEGATIVE
Diagnosis: NEGATIVE
High risk HPV: NEGATIVE

## 2022-11-13 ENCOUNTER — Ambulatory Visit: Payer: 59 | Admitting: Nurse Practitioner

## 2022-11-19 ENCOUNTER — Other Ambulatory Visit: Payer: Self-pay | Admitting: Nurse Practitioner

## 2022-11-19 ENCOUNTER — Encounter: Payer: Self-pay | Admitting: Nurse Practitioner

## 2022-11-19 DIAGNOSIS — E1165 Type 2 diabetes mellitus with hyperglycemia: Secondary | ICD-10-CM

## 2022-11-19 MED ORDER — OZEMPIC (1 MG/DOSE) 4 MG/3ML ~~LOC~~ SOPN: 1.0000 mg | PEN_INJECTOR | SUBCUTANEOUS | 0 refills | Status: DC

## 2022-11-19 MED ORDER — OZEMPIC (1 MG/DOSE) 4 MG/3ML ~~LOC~~ SOPN: 1.0000 mg | PEN_INJECTOR | SUBCUTANEOUS | 2 refills | Status: DC

## 2022-11-19 NOTE — Addendum Note (Signed)
Addended by: Alysia Penna L on: 11/19/2022 02:24 PM   Modules accepted: Orders

## 2023-01-07 ENCOUNTER — Other Ambulatory Visit: Payer: Self-pay | Admitting: Nurse Practitioner

## 2023-01-07 DIAGNOSIS — Z1231 Encounter for screening mammogram for malignant neoplasm of breast: Secondary | ICD-10-CM

## 2023-01-15 ENCOUNTER — Other Ambulatory Visit: Payer: Self-pay | Admitting: Nurse Practitioner

## 2023-01-24 ENCOUNTER — Encounter: Payer: Self-pay | Admitting: Nurse Practitioner

## 2023-01-24 ENCOUNTER — Ambulatory Visit: Payer: 59 | Admitting: Nurse Practitioner

## 2023-01-24 VITALS — BP 128/80 | HR 86 | Temp 98.2°F | Resp 16 | Ht 67.0 in | Wt 194.8 lb

## 2023-01-24 DIAGNOSIS — M65352 Trigger finger, left little finger: Secondary | ICD-10-CM

## 2023-01-24 DIAGNOSIS — I5032 Chronic diastolic (congestive) heart failure: Secondary | ICD-10-CM | POA: Diagnosis not present

## 2023-01-24 DIAGNOSIS — G44229 Chronic tension-type headache, not intractable: Secondary | ICD-10-CM | POA: Diagnosis not present

## 2023-01-24 DIAGNOSIS — N951 Menopausal and female climacteric states: Secondary | ICD-10-CM

## 2023-01-24 DIAGNOSIS — E1169 Type 2 diabetes mellitus with other specified complication: Secondary | ICD-10-CM | POA: Diagnosis not present

## 2023-01-24 DIAGNOSIS — E1165 Type 2 diabetes mellitus with hyperglycemia: Secondary | ICD-10-CM

## 2023-01-24 DIAGNOSIS — M65342 Trigger finger, left ring finger: Secondary | ICD-10-CM

## 2023-01-24 DIAGNOSIS — E785 Hyperlipidemia, unspecified: Secondary | ICD-10-CM

## 2023-01-24 DIAGNOSIS — M62838 Other muscle spasm: Secondary | ICD-10-CM | POA: Diagnosis not present

## 2023-01-24 LAB — POCT GLYCOSYLATED HEMOGLOBIN (HGB A1C): Hemoglobin A1C: 6.1 % — AB (ref 4.0–5.6)

## 2023-01-24 MED ORDER — VENLAFAXINE HCL ER 37.5 MG PO CP24: 37.5000 mg | ORAL_CAPSULE | Freq: Every day | ORAL | 5 refills | Status: DC

## 2023-01-24 MED ORDER — NAPROXEN 500 MG PO TABS: 500.0000 mg | ORAL_TABLET | Freq: Two times a day (BID) | ORAL | 0 refills | Status: DC | PRN

## 2023-01-24 MED ORDER — CYCLOBENZAPRINE HCL 5 MG PO TABS: 5.0000 mg | ORAL_TABLET | Freq: Every evening | ORAL | 0 refills | Status: DC | PRN

## 2023-01-24 NOTE — Patient Instructions (Signed)
Maintain med doses.  Neck Exercises Ask your health care provider which exercises are safe for you. Do exercises exactly as told by your health care provider and adjust them as directed. It is normal to feel mild stretching, pulling, tightness, or discomfort as you do these exercises. Stop right away if you feel sudden pain or your pain gets worse. Do not begin these exercises until told by your health care provider. Neck exercises can be important for many reasons. They can improve strength and maintain flexibility in your neck, which will help your upper back and prevent neck pain. Stretching exercises Rotation neck stretching  Sit in a chair or stand up. Place your feet flat on the floor, shoulder-width apart. Slowly turn your head (rotate) to the right until a slight stretch is felt. Turn it all the way to the right so you can look over your right shoulder. Do not tilt or tip your head. Hold this position for 10-30 seconds. Slowly turn your head (rotate) to the left until a slight stretch is felt. Turn it all the way to the left so you can look over your left shoulder. Do not tilt or tip your head. Hold this position for 10-30 seconds. Repeat __________ times. Complete this exercise __________ times a day. Neck retraction  Sit in a sturdy chair or stand up. Look straight ahead. Do not bend your neck. Use your fingers to push your chin backward (retraction). Do not bend your neck for this movement. Continue to face straight ahead. If you are doing the exercise properly, you will feel a slight sensation in your throat and a stretch at the back of your neck. Hold the stretch for 1-2 seconds. Repeat __________ times. Complete this exercise __________ times a day. Strengthening exercises Neck press  Lie on your back on a firm bed or on the floor with a pillow under your head. Use your neck muscles to push your head down on the pillow and straighten your spine. Hold the position as well as  you can. Keep your head facing up (in a neutral position) and your chin tucked. Slowly count to 5 while holding this position. Repeat __________ times. Complete this exercise __________ times a day. Isometrics These are exercises in which you strengthen the muscles in your neck while keeping your neck still (isometrics). Sit in a supportive chair and place your hand on your forehead. Keep your head and face facing straight ahead. Do not flex or extend your neck while doing isometrics. Push forward with your head and neck while pushing back with your hand. Hold for 10 seconds. Do the sequence again, this time putting your hand against the back of your head. Use your head and neck to push backward against the hand pressure. Finally, do the same exercise on either side of your head, pushing sideways against the pressure of your hand. Repeat __________ times. Complete this exercise __________ times a day. Prone head lifts  Lie face-down (prone position), resting on your elbows so that your chest and upper back are raised. Start with your head facing downward, near your chest. Position your chin either on or near your chest. Slowly lift your head upward. Lift until you are looking straight ahead. Then continue lifting your head as far back as you can comfortably stretch. Hold your head up for 5 seconds. Then slowly lower it to your starting position. Repeat __________ times. Complete this exercise __________ times a day. Supine head lifts  Lie on your back (supine  position), bending your knees to point to the ceiling and keeping your feet flat on the floor. Lift your head slowly off the floor, raising your chin toward your chest. Hold for 5 seconds. Repeat __________ times. Complete this exercise __________ times a day. Scapular retraction  Stand with your arms at your sides. Look straight ahead. Slowly pull both shoulders (scapulae) backward and downward (retraction) until you feel a stretch  between your shoulder blades in your upper back. Hold for 10-30 seconds. Relax and repeat. Repeat __________ times. Complete this exercise __________ times a day. Contact a health care provider if: Your neck pain or discomfort gets worse when you do an exercise. Your neck pain or discomfort does not improve within 2 hours after you exercise. If you have any of these problems, stop exercising right away. Do not do the exercises again unless your health care provider says that you can. Get help right away if: You develop sudden, severe neck pain. If this happens, stop exercising right away. Do not do the exercises again unless your health care provider says that you can. This information is not intended to replace advice given to you by your health care provider. Make sure you discuss any questions you have with your health care provider. Document Revised: 05/22/2021 Document Reviewed: 05/22/2021 Elsevier Patient Education  [place].

## 2023-01-24 NOTE — Assessment & Plan Note (Addendum)
No LE edema BP at goal Current use of spironolactone, lisinopril and metoprolol Under the care of cardiology. Last echo 07/2022: Left ventricular ejection fraction, by estimation, is 60 to 65%. The left ventricle has normal function. The left ventricle has no regional wall motion abnormalities. Left ventricular diastolic parameters are consistent with Grade I diastolic dysfunction (impaired relaxation). Right ventricular systolic function is normal. The right ventricular size is normal. Left atrial size was mild to moderately dilated. The mitral valve is normal in structure. No evidence of mitral valve regurgitation. No evidence of mitral stenosis. The aortic valve is normal in structure. Aortic valve regurgitation is not visualized. No aortic stenosis is present. The inferior vena cava is normal in size with greater than 50% respiratory variability, suggesting right atrial pressure of 67mHg.   BP Readings from Last 3 Encounters:  01/24/23 128/80  10/24/22 (!) 126/96  07/25/22 126/76

## 2023-01-24 NOTE — Assessment & Plan Note (Signed)
Chronic and recurrent Associated with MCP joint pain and swelling,  no erythema, no joint effusion Some improvement with use of compression gloves at hs Entered referral to ortho-hand specialist

## 2023-01-24 NOTE — Assessment & Plan Note (Addendum)
No paresthesia, no weakness, no dizziness No longer takes ASA. Associated with right cervical muscle and trapezius muscle spasm. 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  Advised to start naproxen 34m BID prn and flexeril 510mat hs Provided home exercises Consider referral to sports medicine for trigger injection/dry needling if no improvement

## 2023-01-24 NOTE — Progress Notes (Signed)
Established Patient Visit  Patient: Alicia Booth   DOB: 1971/06/17   52 y.o. Female  MRN: [id number] Visit Date: 01/24/2023  Subjective:    Chief Complaint  Patient presents with   Medical Management of Chronic Issues    Dm and chol.  Pt Is fasting    HPI Chronic diastolic congestive heart failure (HCC) No LE edema BP at goal Current use of spironolactone, lisinopril and metoprolol Under the care of cardiology. Last echo 07/2022: Left ventricular ejection fraction, by estimation, is 60 to 65%. The left ventricle has normal function. The left ventricle has no regional wall motion abnormalities. Left ventricular diastolic parameters are consistent with Grade I diastolic dysfunction (impaired relaxation). Right ventricular systolic function is normal. The right ventricular size is normal. Left atrial size was mild to moderately dilated. The mitral valve is normal in structure. No evidence of mitral valve regurgitation. No evidence of mitral stenosis. The aortic valve is normal in structure. Aortic valve regurgitation is not visualized. No aortic stenosis is present. The inferior vena cava is normal in size with greater than 50% respiratory variability, suggesting right atrial pressure of 24mHg.   BP Readings from Last 3 Encounters:  01/24/23 128/80  10/24/22 (!) 126/96  07/25/22 126/76     Diabetes ([place] Controlled Dm with ozempic 113mRepeat hgbA1c: 6.1% Maintain med dose F/up in 54m754monthTrigger finger, left little finger Chronic and recurrent Associated with MCP joint pain and swelling,  no erythema, no joint effusion Some improvement with use of compression gloves at hs Entered referral to ortho-hand specialist  Perimenopausal symptom Worsening hot flashes, worse at night Up to date with mammogram and PAP Has menstrual cycle every 28-30days. Minimal improvement with OTC supplement Agreed to to start effexor 37.5mg61mily  Chronic tension-type headache, not  intractable No paresthesia, no weakness, no dizziness No longer takes ASA. Associated with right cervical muscle and trapezius muscle spasm. 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  Advised to start naproxen 50mg48m prn and flexeril 5mg a354ms Provided home exercises Consider referral to sports medicine for trigger injection/dry needling if no improvement  BP Readings from Last 3 Encounters:  01/24/23 128/80  10/24/22 (!) 126/96  07/25/22 126/76    Wt Readings from Last 3 Encounters:  01/24/23 194 lb 12.8 oz (88.4 kg)  10/24/22 197 lb 3.2 oz (89.4 kg)  07/25/22 201 lb 6.4 oz (91.4 kg)    Reviewed medical, surgical, and social history today  Medications: Outpatient Medications Prior to Visit  Medication Sig   fluticasone (FLONASE) 50 MCG/ACT nasal spray Place into both nostrils as needed for allergies or rhinitis.   glucose blood (ONETOUCH VERIO) test strip Check glucose every other day before breakfast   lisinopril (ZESTRIL) 40 MG tablet TAKE ONE TABLET BY MOUTH AT BEDTIME   metoprolol succinate (TOPROL XL) 100 MG 24 hr tablet Take 1 tablet (100 mg total) by mouth daily. Take with or immediately following a meal. Start medication on 5/6   Misc Natural Products (RELIZEN PO) Take 2 tablets by mouth daily.   Multiple Vitamins-Minerals (ONE-A-DAY 50 PLUS PO) Take 1 tablet by mouth at bedtime.   nitroGLYCERIN (NITROSTAT) 0.4 MG SL tablet Place 1 tablet (0.4 mg total) under the tongue every 5 (five) minutes x 3 doses as needed for  chest pain.   rosuvastatin (CRESTOR) 40 MG tablet Take 1 tablet (40 mg total) by mouth daily.   Semaglutide, 1 MG/DOSE, (OZEMPIC, 1 MG/DOSE,) 4 MG/3ML SOPN Inject 1 mg into the skin once a week.   spironolactone (ALDACTONE) 25 MG tablet Take 0.5 tablets (12.5 mg total) by mouth daily.   [DISCONTINUED] metroNIDAZOLE (METROGEL) 0.75 % vaginal gel  Place 1 Applicatorful vaginally at bedtime. (Patient not taking: Reported on 01/24/2023)   No facility-administered medications prior to visit.   Reviewed past medical and social history.   ROS per HPI above CBC    Component Value Date/Time   WBC 9.0 10/24/2022 1111   RBC 5.00 10/24/2022 1111   HGB 14.1 10/24/2022 1111   HCT 43.1 10/24/2022 1111   PLT 255.0 10/24/2022 1111   MCV 86.2 10/24/2022 1111   MCH 28.0 04/11/2022 1016   MCHC 32.7 10/24/2022 1111   RDW 14.4 10/24/2022 1111   LYMPHSABS 2.2 12/29/2021 1016   MONOABS 0.8 12/29/2021 1016   EOSABS 0.2 12/29/2021 1016   BASOSABS 0.1 12/29/2021 1016    CMP     Component Value Date/Time   NA 139 10/24/2022 1111   NA 141 05/08/2022 0827   K 4.0 10/24/2022 1111   CL 103 10/24/2022 1111   CO2 31 10/24/2022 1111   GLUCOSE 98 10/24/2022 1111   BUN 9 10/24/2022 1111   BUN 10 05/08/2022 0827   CREATININE 0.68 10/24/2022 1111   CALCIUM 9.3 10/24/2022 1111   PROT 7.4 10/24/2022 1111   ALBUMIN 4.3 10/24/2022 1111   AST 16 10/24/2022 1111   ALT 14 10/24/2022 1111   ALKPHOS 53 10/24/2022 1111   BILITOT 0.6 10/24/2022 1111   GFRNONAA >60 04/12/2022 0222       Objective:  BP 128/80   Pulse 86   Temp 98.2 F (36.8 C) (Temporal)   Resp 16   Ht 5' 7"$  (1.702 m)   Wt 194 lb 12.8 oz (88.4 kg)   SpO2 96%   BMI 30.51 kg/m      Physical Exam Cardiovascular:     Rate and Rhythm: Normal rate and regular rhythm.     Pulses: Normal pulses.     Heart sounds: Normal heart sounds.  Pulmonary:     Effort: Pulmonary effort is normal.     Breath sounds: Normal breath sounds.  Musculoskeletal:        General: Normal range of motion.     Right shoulder: Normal.     Cervical back: Normal range of motion. Spasms and tenderness present. No rigidity or torticollis. Pain with movement present. Normal range of motion.     Right lower leg: No edema.     Left lower leg: No edema.  Neurological:     Mental Status: She is alert and  oriented to person, place, and time.     Results for orders placed or performed in visit on 01/24/23  POCT glycosylated hemoglobin (Hb A1C)  Result Value Ref Range   Hemoglobin A1C 6.1 (A) 4.0 - 5.6 %   HbA1c POC (<> result, manual entry)     HbA1c, POC (prediabetic range)     HbA1c, POC (controlled diabetic range)    HM DIABETES EYE EXAM  Result Value Ref Range   HM Diabetic Eye Exam No Retinopathy No Retinopathy      Assessment & Plan:    Problem List Items Addressed This Visit       Cardiovascular and Mediastinum   Chronic diastolic congestive  heart failure (HCC)    No LE edema BP at goal Current use of spironolactone, lisinopril and metoprolol Under the care of cardiology. Last echo 07/2022: Left ventricular ejection fraction, by estimation, is 60 to 65%. The left ventricle has normal function. The left ventricle has no regional wall motion abnormalities. Left ventricular diastolic parameters are consistent with Grade I diastolic dysfunction (impaired relaxation). Right ventricular systolic function is normal. The right ventricular size is normal. Left atrial size was mild to moderately dilated. The mitral valve is normal in structure. No evidence of mitral valve regurgitation. No evidence of mitral stenosis. The aortic valve is normal in structure. Aortic valve regurgitation is not visualized. No aortic stenosis is present. The inferior vena cava is normal in size with greater than 50% respiratory variability, suggesting right atrial pressure of 27mHg.   BP Readings from Last 3 Encounters:  01/24/23 128/80  10/24/22 (!) 126/96  07/25/22 126/76           Endocrine   Diabetes ([place]    Controlled Dm with ozempic 145mRepeat hgbA1c: 6.1% Maintain med dose F/up in 8m13month    Relevant Orders   POCT glycosylated hemoglobin (Hb A1C) (Completed)     Musculoskeletal and Integument   Trigger finger, left little finger    Chronic and recurrent Associated with MCP  joint pain and swelling,  no erythema, no joint effusion Some improvement with use of compression gloves at hs Entered referral to ortho-hand specialist        Other   Chronic tension-type headache, not intractable    No paresthesia, no weakness, no dizziness No longer takes ASA. Associated with right cervical muscle and trapezius muscle spasm. 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  Advised to start naproxen 37m77mD prn and flexeril 5mg 69mhs Provided home exercises Consider referral to sports medicine for trigger injection/dry needling if no improvement      Relevant Medications   cyclobenzaprine (FLEXERIL) 5 MG tablet   naproxen (NAPROSYN) 500 MG tablet   venlafaxine XR (EFFEXOR XR) 37.5 MG 24 hr capsule   Perimenopausal symptom    Worsening hot flashes, worse at night Up to date with mammogram and PAP Has menstrual cycle every 28-30days. Minimal improvement with OTC supplement Agreed to to start effexor 37.5mg d7my      Relevant Medications   venlafaxine XR (EFFEXOR XR) 37.5 MG 24 hr capsule   Other Visit Diagnoses     Cervical paraspinal muscle spasm       Relevant Medications   cyclobenzaprine (FLEXERIL) 5 MG tablet   naproxen (NAPROSYN) 500 MG tablet   Trigger ring finger of left hand       Relevant Orders   Ambulatory referral to Orthopedics      Return in about 6 months (around 07/25/2023) for DM, HTN, hyperlipidemia (fasting, check TSh).     CharloWilfred Lacy

## 2023-01-24 NOTE — Assessment & Plan Note (Addendum)
Worsening hot flashes, worse at night Up to date with mammogram and PAP Has menstrual cycle every 28-30days. Minimal improvement with OTC supplement Agreed to to start effexor 37.53m daily

## 2023-01-24 NOTE — Assessment & Plan Note (Signed)
Controlled Dm with ozempic 57m Repeat hgbA1c: 6.1% Maintain med dose F/up in 656month

## 2023-01-30 ENCOUNTER — Ambulatory Visit: Payer: 59 | Admitting: Orthopaedic Surgery

## 2023-01-30 ENCOUNTER — Encounter: Payer: Self-pay | Admitting: Orthopaedic Surgery

## 2023-01-30 DIAGNOSIS — M65342 Trigger finger, left ring finger: Secondary | ICD-10-CM

## 2023-01-30 DIAGNOSIS — R202 Paresthesia of skin: Secondary | ICD-10-CM | POA: Diagnosis not present

## 2023-01-30 NOTE — Progress Notes (Signed)
Office Visit Note   Patient: Alicia Booth           Date of Birth: [birth year]           MRN: [id number] Visit Date: 01/30/2023              Requested by: Flossie Buffy, NP [place],  [place] [address] PCP: Flossie Buffy, NP   Assessment & Plan: Visit Diagnoses:  1. Trigger finger, left ring finger   2. Paresthesia of both hands     Plan: Impression is left ring trigger finger in addition to bilateral hand paresthesias with probable underlying bilateral carpal tunnel syndrome both equally as bad.  In regards to the trigger finger, we have discussed cortisone injection and night splinting.  In regards to the hand paresthesias, recommended wrist splints to wear at night in addition to referral for nerve conduction study/EMG.  She is agreeable with this plan.  She will follow-up with [place] once the nerve conduction study has been completed and at that point we will likely proceed with trigger finger injection and night splint.  She will call with any concerns or questions in the meantime.  Follow-Up Instructions: Return for after NCS/EMG.   Orders:  Orders Placed This Encounter  Procedures   Ambulatory referral to Physical Medicine Rehab   No orders of the defined types were placed in this encounter.     Procedures: No procedures performed   Clinical Data: No additional findings.   Subjective: Chief Complaint  Patient presents with   Left Hand - Numbness   Right Hand - Numbness    HPI patient is a very pleasant 52 year old right-hand-dominant female who comes in today with pain and triggering to the left ring finger in addition to paresthesias to both hands.  In regards to the trigger finger, she has had this for over 3 years.  No previous history of trigger finger that she can recall.  She does have a history of diabetes.  Symptoms are worse in the morning and throughout the night.  She also complains of paresthesias to both hands throughout  the night.  She often has to hang her arm over the side of the bed to help alleviate her symptoms.  She has worn compression gloves which help a little.  She has never been diagnosed with carpal tunnel syndrome.  She does have a history of right-sided tension headaches with tension into what sounds like her right traps.  Review of Systems as detailed in HPI.  All others reviewed and are negative.   Objective: Vital Signs: There were no vitals taken for this visit.  Physical Exam well-developed well-nourished female no acute distress.  Alert and oriented x 3.  Ortho Exam left hand exam reveals moderate pain in the palpable nodule at the A1 pulley of the ring finger.  She does have reproducible triggering.  Negative Phalen and negative Tinel at the wrist and elbow.  Fingers are warm well-perfused.  She is neurovascular intact distally.  Specialty Comments:  No specialty comments available.  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  Adjustment disorder with mixed anxiety and depressed mood 04/24/2022   Perimenopausal symptom 04/24/2022   NSTEMI (non-ST elevated myocardial infarction) ([place]) 04/11/2022   Myopia with astigmatism and presbyopia, bilateral 03/19/2018   Obstructive sleep apnea (adult) (pediatric) 10/13/2012   Diabetes ([place]) 11/16/2009   Hypertension 11/16/2009   Past Medical History:  Diagnosis Date   Diabetes ([place])    High cholesterol    Hypertension    Stress-induced cardiomyopathy     Family History  Problem Relation Age of Onset   Breast cancer Mother 50    Heart attack Maternal Grandmother 62    Past Surgical History:  Procedure Laterality Date   BARIATRIC SURGERY  2014   BREAST BIOPSY Left    lymph node bx benign   HEEL SPUR EXCISION     LEFT HEART CATH AND CORONARY ANGIOGRAPHY N/A 04/11/2022   Procedure: LEFT HEART CATH AND CORONARY ANGIOGRAPHY;  Surgeon: Jettie Booze, MD;  Location: [facility];  Service: Cardiovascular;  Laterality: N/A;   MENISCUS REPAIR     tummy tuck     Social History   Occupational History   Not on file  Tobacco Use   Smoking status: Never   Smokeless tobacco: Never  Vaping Use   Vaping Use: Never used  Substance and Sexual Activity   Alcohol use: Yes    Comment: occassionally   Drug use: Never   Sexual activity: Yes

## 2023-02-12 ENCOUNTER — Encounter: Payer: Self-pay | Admitting: Nurse Practitioner

## 2023-02-13 ENCOUNTER — Other Ambulatory Visit: Payer: Self-pay

## 2023-02-13 DIAGNOSIS — E1165 Type 2 diabetes mellitus with hyperglycemia: Secondary | ICD-10-CM

## 2023-02-13 MED ORDER — OZEMPIC (1 MG/DOSE) 4 MG/3ML ~~LOC~~ SOPN: 1.0000 mg | PEN_INJECTOR | SUBCUTANEOUS | 2 refills | Status: DC

## 2023-02-19 ENCOUNTER — Ambulatory Visit (INDEPENDENT_AMBULATORY_CARE_PROVIDER_SITE_OTHER): Payer: 59 | Admitting: Physical Medicine and Rehabilitation

## 2023-02-19 DIAGNOSIS — R202 Paresthesia of skin: Secondary | ICD-10-CM | POA: Diagnosis not present

## 2023-02-19 DIAGNOSIS — M7918 Myalgia, other site: Secondary | ICD-10-CM

## 2023-02-19 DIAGNOSIS — M79642 Pain in left hand: Secondary | ICD-10-CM

## 2023-02-19 DIAGNOSIS — M65342 Trigger finger, left ring finger: Secondary | ICD-10-CM

## 2023-02-19 DIAGNOSIS — M79641 Pain in right hand: Secondary | ICD-10-CM

## 2023-02-19 DIAGNOSIS — M542 Cervicalgia: Secondary | ICD-10-CM | POA: Diagnosis not present

## 2023-02-19 NOTE — Progress Notes (Signed)
Functional Pain Scale - descriptive words and definitions  Uncomfortable (3)  Pain is present but can complete all ADL's/sleep is slightly affected and passive distraction only gives marginal relief. Mild range order  Average Pain 5-6  Right handed. Bilateral hand numbness, mainly in the pinky fingers. Pain in ring finger on left hand, triggers at times

## 2023-02-21 NOTE — Procedures (Signed)
EMG & NCV Findings: Evaluation of the left median motor and the right median motor nerves showed prolonged distal onset latency (L4.8, R4.6 ms) and decreased conduction velocity (Elbow-Wrist, L48, R47 m/s).  The right ulnar motor nerve showed decreased conduction velocity (B Elbow-Wrist, 51 m/s).  The left median (across palm) sensory nerve showed no response (Palm) and prolonged distal peak latency (4.6 ms).  The right median (across palm) sensory nerve showed prolonged distal peak latency (Wrist, 4.0 ms).  All remaining nerves (as indicated in the following tables) were within normal limits.  All left vs. right side differences were within normal limits.    All examined muscles (as indicated in the following table) showed no evidence of electrical instability.    Impression: The above electrodiagnostic study is ABNORMAL and reveals evidence of a moderate bilateral median nerve entrapment at the wrist (carpal tunnel syndrome) affecting sensory and motor components.   There is no significant electrodiagnostic evidence of any other focal nerve entrapment, brachial plexopathy or cervical radiculopathy.   Recommendations: 1.  Follow-up with referring physician. 2.  Continue current management of symptoms. 3.  Continue use of resting splint at night-time and as needed during the day. 4.  Suggest surgical evaluation.  ___________________________ Laurence Spates FAAPMR Board Certified, American Board of Physical Medicine and Rehabilitation    Nerve Conduction Studies Anti Sensory Summary Table   Stim Site NR Peak (ms) Norm Peak (ms) P-T Amp (V) Norm P-T Amp Site1 Site2 Delta-P (ms) Dist (cm) Vel (m/s) Norm Vel (m/s)  Left Median Acr Palm Anti Sensory (2nd Digit)  31C  Wrist    *4.6 <3.6 25.3 >10 Wrist Palm  0.0    Palm *NR  <2.0          Right Median Acr Palm Anti Sensory (2nd Digit)  29.3C  Wrist    *4.0 <3.6 35.1 >10 Wrist Palm 2.2 0.0    Palm    1.8 <2.0 19.6         Left Radial Anti  Sensory (Base 1st Digit)  30.9C  Wrist    2.2 <3.1 58.8  Wrist Base 1st Digit 2.2 0.0    Right Radial Anti Sensory (Base 1st Digit)  29.8C  Wrist    2.0 <3.1 34.0  Wrist Base 1st Digit 2.0 0.0    Left Ulnar Anti Sensory (5th Digit)  31.1C  Wrist    3.5 <3.7 28.4 >15.0 Wrist 5th Digit 3.5 14.0 40 >38  Right Ulnar Anti Sensory (5th Digit)  29.9C  Wrist    3.3 <3.7 20.4 >15.0 Wrist 5th Digit 3.3 14.0 42 >38   Motor Summary Table   Stim Site NR Onset (ms) Norm Onset (ms) O-P Amp (mV) Norm O-P Amp Site1 Site2 Delta-0 (ms) Dist (cm) Vel (m/s) Norm Vel (m/s)  Left Median Motor (Abd Poll Brev)  31.2C  Wrist    *4.8 <4.2 6.3 >5 Elbow Wrist 4.8 23.0 *48 >50  Elbow    9.6  5.3         Right Median Motor (Abd Poll Brev)  30.3C  Wrist    *4.6 <4.2 6.7 >5 Elbow Wrist 4.5 21.0 *47 >50  Elbow    9.1  4.6         Left Ulnar Motor (Abd Dig Min)  31.3C  Wrist    3.3 <4.2 7.6 >3 B Elbow Wrist 3.8 20.0 53 >53  B Elbow    7.1  3.7  A Elbow B Elbow 1.5 10.0 67 >53  A Elbow    8.6  5.2         Right Ulnar Motor (Abd Dig Min)  30.4C  Wrist    2.9 <4.2 9.4 >3 B Elbow Wrist 3.9 20.0 *51 >53  B Elbow    6.8  8.3  A Elbow B Elbow 1.4 10.0 71 >53  A Elbow    8.2  9.0          EMG   Side Muscle Nerve Root Ins Act Fibs Psw Amp Dur Poly Recrt Int Fraser Din Comment  Left Abd Poll Brev Median C8-T1 Nml Nml Nml Nml Nml 0 Nml Nml   Left 1stDorInt Ulnar C8-T1 Nml Nml Nml Nml Nml 0 Nml Nml   Left PronatorTeres Median C6-7 Nml Nml Nml Nml Nml 0 Nml Nml   Left Biceps Musculocut C5-6 Nml Nml Nml Nml Nml 0 Nml Nml   Left Deltoid Axillary C5-6 Nml Nml Nml Nml Nml 0 Nml Nml     Nerve Conduction Studies Anti Sensory Left/Right Comparison   Stim Site L Lat (ms) R Lat (ms) L-R Lat (ms) L Amp (V) R Amp (V) L-R Amp (%) Site1 Site2 L Vel (m/s) R Vel (m/s) L-R Vel (m/s)  Median Acr Palm Anti Sensory (2nd Digit)  31C  Wrist *4.6 *4.0 0.6 25.3 35.1 27.9 Wrist Palm     Palm  1.8   19.6        Radial Anti Sensory (Base 1st  Digit)  30.9C  Wrist 2.2 2.0 0.2 58.8 34.0 42.2 Wrist Base 1st Digit     Ulnar Anti Sensory (5th Digit)  31.1C  Wrist 3.5 3.3 0.2 28.4 20.4 28.2 Wrist 5th Digit 40 42 2   Motor Left/Right Comparison   Stim Site L Lat (ms) R Lat (ms) L-R Lat (ms) L Amp (mV) R Amp (mV) L-R Amp (%) Site1 Site2 L Vel (m/s) R Vel (m/s) L-R Vel (m/s)  Median Motor (Abd Poll Brev)  31.2C  Wrist *4.8 *4.6 0.2 6.3 6.7 6.0 Elbow Wrist *48 *47 1  Elbow 9.6 9.1 0.5 5.3 4.6 13.2       Ulnar Motor (Abd Dig Min)  31.3C  Wrist 3.3 2.9 0.4 7.6 9.4 19.1 B Elbow Wrist 53 *51 2  B Elbow 7.1 6.8 0.3 3.7 8.3 55.4 A Elbow B Elbow 67 71 4  A Elbow 8.6 8.2 0.4 5.2 9.0 42.2          Waveforms:

## 2023-02-24 ENCOUNTER — Ambulatory Visit
Admission: RE | Admit: 2023-02-24 | Discharge: 2023-02-24 | Disposition: A | Discharge status: Home / Self Care | Payer: 59 | Source: Ambulatory Visit | Attending: Nurse Practitioner | Admitting: Nurse Practitioner

## 2023-02-24 DIAGNOSIS — Z1231 Encounter for screening mammogram for malignant neoplasm of breast: Secondary | ICD-10-CM

## 2023-02-25 NOTE — Progress Notes (Signed)
Alicia Booth - 52 y.o. female MRN [id number]  Date of birth: [birth year]  Office Visit Note: Visit Date: 02/19/2023 PCP: Flossie Buffy, NP Referred by: Leandrew Koyanagi, MD  Subjective: Chief Complaint  Patient presents with   Right Hand - Numbness   Left Hand - Numbness   HPI: Alicia Booth is a 52 y.o. female who comes in today at the request of Dr. Eduard Roux for evaluation and management of chronic, worsening and severe pain, numbness and tingling in the Bilateral upper extremities.  Patient is Right hand dominant.  She reports approximately 3 years or so of worsening hand pain with triggering and catching of the left ring finger.  In terms of the hand pain she gets numbness and tingling in both hands but it is predominantly the fifth digits bilaterally equally.  She reports worsening at night with nocturnal complaints and a positive flick sign but also some in the morning.  She does report hanging her arm over the side of the bed to get some relief.  She has not specifically worn splints but has had some compression gloves but this really has not helped that much.  She does have a history of neck pain with tension headaches and potentially trigger point issues.  She does get some referral down the left arm.  She has not had any imaging of the cervical spine.  No prior surgery of the spine or arms or hands.  She is a type II diabetic with last hemoglobin A1c of 6.1 this is been on a downward trend for several months.  She denies any real symptoms in the feet or no history of polyneuropathy.  She has not had prior electrodiagnostic studies.  She reports her average pain 5 and 6.  She pain function scale can still do activities daily living but sleep is somewhat affected.  She has tried all manner of other conservative care at this point.       Review of Systems  Musculoskeletal:  Positive for joint pain and neck pain.  Neurological:  Positive for tingling.  All other systems reviewed  and are negative.  Otherwise per HPI.  Assessment & Plan: Visit Diagnoses:    ICD-10-CM   1. Paresthesia of skin  R20.2 NCV with EMG (electromyography)    2. Bilateral hand pain  M79.641    M79.642     3. Trigger ring finger of left hand  M65.342     4. Cervicalgia  M54.2     5. Myofascial pain syndrome  M79.18        Plan: Impression: Clinically she makes an excellent case for carpal tunnel syndrome except for the fact that her symptoms seem to be more of the ulnar digits and fifth digit.  She also has triggering and pain in the hands likely some arthritic changes and musculoskeletal.  She clearly has trigger points in the trapezius levator scapula area this could also cause referred pain down the arm.  No cervical MRI.  Electrodiagnostic study performed today.  The above electrodiagnostic study is ABNORMAL and reveals evidence of a moderate bilateral median nerve entrapment at the wrist (carpal tunnel syndrome) affecting sensory and motor components.   There is no significant electrodiagnostic evidence of any other focal nerve entrapment, brachial plexopathy or cervical radiculopathy.   Recommendations: 1.  Follow-up with referring physician.  Careful clinical correlation with symptoms. 2.  Continue current management of symptoms.  Suggest cervical MRI if felt to be cervical radiculopathy  on the left but I do feel like this is more myofascial pain syndrome. 3.  Continue use of resting splint at night-time and as needed during the day. 4.  Suggest surgical evaluation.  Meds & Orders: No orders of the defined types were placed in this encounter.   Orders Placed This Encounter  Procedures   NCV with EMG (electromyography)    Follow-up: Return for  Eduard Roux, MD.   Procedures: No procedures performed  EMG & NCV Findings: Evaluation of the left median motor and the right median motor nerves showed prolonged distal onset latency (L4.8, R4.6 ms) and decreased conduction velocity  (Elbow-Wrist, L48, R47 m/s).  The right ulnar motor nerve showed decreased conduction velocity (B Elbow-Wrist, 51 m/s).  The left median (across palm) sensory nerve showed no response (Palm) and prolonged distal peak latency (4.6 ms).  The right median (across palm) sensory nerve showed prolonged distal peak latency (Wrist, 4.0 ms).  All remaining nerves (as indicated in the following tables) were within normal limits.  All left vs. right side differences were within normal limits.    All examined muscles (as indicated in the following table) showed no evidence of electrical instability.    Impression: The above electrodiagnostic study is ABNORMAL and reveals evidence of a moderate bilateral median nerve entrapment at the wrist (carpal tunnel syndrome) affecting sensory and motor components.   There is no significant electrodiagnostic evidence of any other focal nerve entrapment, brachial plexopathy or cervical radiculopathy.   Recommendations: 1.  Follow-up with referring physician. 2.  Continue current management of symptoms. 3.  Continue use of resting splint at night-time and as needed during the day. 4.  Suggest surgical evaluation.  ___________________________ Laurence Spates FAAPMR Board Certified, American Board of Physical Medicine and Rehabilitation    Nerve Conduction Studies Anti Sensory Summary Table   Stim Site NR Peak (ms) Norm Peak (ms) P-T Amp (V) Norm P-T Amp Site1 Site2 Delta-P (ms) Dist (cm) Vel (m/s) Norm Vel (m/s)  Left Median Acr Palm Anti Sensory (2nd Digit)  31C  Wrist    *4.6 <3.6 25.3 >10 Wrist Palm  0.0    Palm *NR  <2.0          Right Median Acr Palm Anti Sensory (2nd Digit)  29.3C  Wrist    *4.0 <3.6 35.1 >10 Wrist Palm 2.2 0.0    Palm    1.8 <2.0 19.6         Left Radial Anti Sensory (Base 1st Digit)  30.9C  Wrist    2.2 <3.1 58.8  Wrist Base 1st Digit 2.2 0.0    Right Radial Anti Sensory (Base 1st Digit)  29.8C  Wrist    2.0 <3.1 34.0  Wrist Base 1st  Digit 2.0 0.0    Left Ulnar Anti Sensory (5th Digit)  31.1C  Wrist    3.5 <3.7 28.4 >15.0 Wrist 5th Digit 3.5 14.0 40 >38  Right Ulnar Anti Sensory (5th Digit)  29.9C  Wrist    3.3 <3.7 20.4 >15.0 Wrist 5th Digit 3.3 14.0 42 >38   Motor Summary Table   Stim Site NR Onset (ms) Norm Onset (ms) O-P Amp (mV) Norm O-P Amp Site1 Site2 Delta-0 (ms) Dist (cm) Vel (m/s) Norm Vel (m/s)  Left Median Motor (Abd Poll Brev)  31.2C  Wrist    *4.8 <4.2 6.3 >5 Elbow Wrist 4.8 23.0 *48 >50  Elbow    9.6  5.3         Right  Median Motor (Abd Poll Brev)  30.3C  Wrist    *4.6 <4.2 6.7 >5 Elbow Wrist 4.5 21.0 *47 >50  Elbow    9.1  4.6         Left Ulnar Motor (Abd Dig Min)  31.3C  Wrist    3.3 <4.2 7.6 >3 B Elbow Wrist 3.8 20.0 53 >53  B Elbow    7.1  3.7  A Elbow B Elbow 1.5 10.0 67 >53  A Elbow    8.6  5.2         Right Ulnar Motor (Abd Dig Min)  30.4C  Wrist    2.9 <4.2 9.4 >3 B Elbow Wrist 3.9 20.0 *51 >53  B Elbow    6.8  8.3  A Elbow B Elbow 1.4 10.0 71 >53  A Elbow    8.2  9.0          EMG   Side Muscle Nerve Root Ins Act Fibs Psw Amp Dur Poly Recrt Int Fraser Din Comment  Left Abd Poll Brev Median C8-T1 Nml Nml Nml Nml Nml 0 Nml Nml   Left 1stDorInt Ulnar C8-T1 Nml Nml Nml Nml Nml 0 Nml Nml   Left PronatorTeres Median C6-7 Nml Nml Nml Nml Nml 0 Nml Nml   Left Biceps Musculocut C5-6 Nml Nml Nml Nml Nml 0 Nml Nml   Left Deltoid Axillary C5-6 Nml Nml Nml Nml Nml 0 Nml Nml     Nerve Conduction Studies Anti Sensory Left/Right Comparison   Stim Site L Lat (ms) R Lat (ms) L-R Lat (ms) L Amp (V) R Amp (V) L-R Amp (%) Site1 Site2 L Vel (m/s) R Vel (m/s) L-R Vel (m/s)  Median Acr Palm Anti Sensory (2nd Digit)  31C  Wrist *4.6 *4.0 0.6 25.3 35.1 27.9 Wrist Palm     Palm  1.8   19.6        Radial Anti Sensory (Base 1st Digit)  30.9C  Wrist 2.2 2.0 0.2 58.8 34.0 42.2 Wrist Base 1st Digit     Ulnar Anti Sensory (5th Digit)  31.1C  Wrist 3.5 3.3 0.2 28.4 20.4 28.2 Wrist 5th Digit 40 42 2   Motor  Left/Right Comparison   Stim Site L Lat (ms) R Lat (ms) L-R Lat (ms) L Amp (mV) R Amp (mV) L-R Amp (%) Site1 Site2 L Vel (m/s) R Vel (m/s) L-R Vel (m/s)  Median Motor (Abd Poll Brev)  31.2C  Wrist *4.8 *4.6 0.2 6.3 6.7 6.0 Elbow Wrist *48 *47 1  Elbow 9.6 9.1 0.5 5.3 4.6 13.2       Ulnar Motor (Abd Dig Min)  31.3C  Wrist 3.3 2.9 0.4 7.6 9.4 19.1 B Elbow Wrist 53 *51 2  B Elbow 7.1 6.8 0.3 3.7 8.3 55.4 A Elbow B Elbow 67 71 4  A Elbow 8.6 8.2 0.4 5.2 9.0 42.2          Waveforms:                      Clinical History: No specialty comments available.   She reports that she has never smoked. She has never used smokeless tobacco.  Recent Labs    07/25/22 0915 10/24/22 1111 01/24/23 0912  HGBA1C 6.8* 6.1 6.1*    Objective:  VS:  HT:    WT:   BMI:     BP:   HR: bpm  TEMP: ( )  RESP:  Physical Exam Vitals and nursing note reviewed.  Constitutional:      General: She is not in acute distress.    Appearance: Normal appearance. She is well-developed. She is not ill-appearing.  HENT:     Head: Normocephalic and atraumatic.  Eyes:     Conjunctiva/sclera: Conjunctivae normal.     Pupils: Pupils are equal, round, and reactive to light.  Neck:     Comments: Positive trigger points. Cardiovascular:     Rate and Rhythm: Normal rate.     Pulses: Normal pulses.  Pulmonary:     Effort: Pulmonary effort is normal.  Musculoskeletal:        General: Tenderness present. No swelling or deformity.     Cervical back: Tenderness present.     Right lower leg: No edema.     Left lower leg: No edema.     Comments: Inspection reveals no atrophy of the bilateral APB or FDI or hand intrinsics. There is no swelling, color changes, allodynia or dystrophic changes. There is 5 out of 5 strength in the bilateral wrist extension, finger abduction and long finger flexion. There is intact sensation to light touch in all dermatomal and peripheral nerve distributions. There is a negative  Froment's test bilaterally. There is a negative Tinel's test at the bilateral wrist and elbow. There is a clinically positive Phalen's test bilaterally. There is a negative Hoffmann's test bilaterally.  Skin:    General: Skin is warm and dry.     Findings: No erythema or rash.  Neurological:     General: No focal deficit present.     Mental Status: She is alert and oriented to person, place, and time.     Sensory: No sensory deficit.     Motor: No weakness or abnormal muscle tone.     Coordination: Coordination normal.     Gait: Gait normal.  Psychiatric:        Mood and Affect: Mood normal.        Behavior: Behavior normal.     Ortho Exam  Imaging: No results found.  Past Medical/Family/Surgical/Social History: Medications & Allergies reviewed per EMR, new medications updated. Patient Active Problem List   Diagnosis Date Noted   Chronic diastolic congestive heart failure ([place]) 01/24/2023   Chronic vaginitis 10/24/2022   Hyperlipidemia associated with type 2 diabetes mellitus ([place]) 10/24/2022   Chronic tension-type headache, not intractable 05/23/2022   Trigger finger, left little finger 05/23/2022   Takotsubo cardiomyopathy 04/24/2022   S/P gastric sleeve procedure 04/24/2022   Adjustment disorder with mixed anxiety and depressed mood 04/24/2022   Perimenopausal symptom 04/24/2022   NSTEMI (non-ST elevated myocardial infarction) ([facility] [facility]) 04/11/2022   Myopia with astigmatism and presbyopia, bilateral 03/19/2018   Obstructive sleep apnea (adult) (pediatric) 10/13/2012   Diabetes ([place]) 11/16/2009   Hypertension 11/16/2009   Past Medical History:  Diagnosis Date   Diabetes ([place])    High cholesterol    Hypertension    Stress-induced cardiomyopathy    Family History  Problem Relation Age of Onset   Breast cancer Mother 38   Heart attack Maternal Grandmother 62   Past Surgical History:  Procedure Laterality Date   BARIATRIC SURGERY  2014   BREAST BIOPSY Left    lymph  node bx benign   HEEL SPUR EXCISION     LEFT HEART CATH AND CORONARY ANGIOGRAPHY N/A 04/11/2022   Procedure: LEFT HEART CATH AND CORONARY ANGIOGRAPHY;  Surgeon: Jettie Booze, MD;  Location: [facility];  Service: Cardiovascular;  Laterality: N/A;   MENISCUS REPAIR  tummy tuck     Social History   Occupational History   Not on file  Tobacco Use   Smoking status: Never   Smokeless tobacco: Never  Vaping Use   Vaping Use: Never used  Substance and Sexual Activity   Alcohol use: Yes    Comment: occassionally   Drug use: Never   Sexual activity: Yes

## 2023-02-27 ENCOUNTER — Encounter: Payer: Self-pay | Admitting: Orthopaedic Surgery

## 2023-02-27 ENCOUNTER — Ambulatory Visit: Payer: 59 | Admitting: Orthopaedic Surgery

## 2023-02-27 DIAGNOSIS — M65342 Trigger finger, left ring finger: Secondary | ICD-10-CM | POA: Diagnosis not present

## 2023-02-27 DIAGNOSIS — G5602 Carpal tunnel syndrome, left upper limb: Secondary | ICD-10-CM | POA: Diagnosis not present

## 2023-02-27 NOTE — Progress Notes (Signed)
Office Visit Note   Patient: Alicia Booth           Date of Birth: 04/04/1971           MRN: [id number] Visit Date: 02/27/2023              Requested by: Flossie Buffy, NP [place],  [place] [address] PCP: Flossie Buffy, NP   Assessment & Plan: Visit Diagnoses:  1. Left carpal tunnel syndrome   2. Trigger ring finger of left hand     Plan: Impression is moderate left carpal tunnel syndrome and persistent left ring trigger finger.  Treatment options and disease processes explained.  Based on her options she has elected to undergo left carpal tunnel release and left trigger finger release of the ring finger.  Risk benefits prognosis reviewed with the patient.  Jackelyn Poling will call the patient to confirm surgical time.  Follow-Up Instructions: No follow-ups on file.   Orders:  No orders of the defined types were placed in this encounter.  No orders of the defined types were placed in this encounter.     Procedures: No procedures performed   Clinical Data: No additional findings.   Subjective: Chief Complaint  Patient presents with   Left Hand - Pain    EMG review   Right Hand - Pain    EMG review    HPI  Alicia Booth returns today for follow-up of nerve conduction studies.  Bilateral moderate carpal tunnel syndrome was found.  She continues to have problems with trigger finger of the left ring finger.  Review of Systems   Objective: Vital Signs: There were no vitals taken for this visit.  Physical Exam  Ortho Exam  Examination of bilateral hands is unchanged.  Left ring fingers consistent with locking and trigger finger.  Tenderness of the A1 pulley.  Specialty Comments:  No specialty comments available.  Imaging: No results found.   PMFS History: Patient Active Problem List   Diagnosis Date Noted   Chronic diastolic congestive heart failure ([facility]) 01/24/2023   Chronic vaginitis 10/24/2022   Hyperlipidemia associated with  type 2 diabetes mellitus ([place] [facility]) 10/24/2022   Chronic tension-type headache, not intractable 05/23/2022   Trigger finger, left little finger 05/23/2022   Takotsubo cardiomyopathy 04/24/2022   S/P gastric sleeve procedure 04/24/2022   Adjustment disorder with mixed anxiety and depressed mood 04/24/2022   Perimenopausal symptom 04/24/2022   NSTEMI (non-ST elevated myocardial infarction) ([place] [facility]) 04/11/2022   Myopia with astigmatism and presbyopia, bilateral 03/19/2018   Obstructive sleep apnea (adult) (pediatric) 10/13/2012   Diabetes ([facility]) 11/16/2009   Hypertension 11/16/2009   Past Medical History:  Diagnosis Date   Diabetes ([place])    High cholesterol    Hypertension    Stress-induced cardiomyopathy     Family History  Problem Relation Age of Onset   Breast cancer Mother 58   Heart attack Maternal Grandmother 62    Past Surgical History:  Procedure Laterality Date   BARIATRIC SURGERY  2014   BREAST BIOPSY Left    lymph node bx benign   HEEL SPUR EXCISION     LEFT HEART CATH AND CORONARY ANGIOGRAPHY N/A 04/11/2022   Procedure: LEFT HEART CATH AND CORONARY ANGIOGRAPHY;  Surgeon: Jettie Booze, MD;  Location: [facility];  Service: Cardiovascular;  Laterality: N/A;   MENISCUS REPAIR     tummy tuck     Social History   Occupational History   Not on file  Tobacco Use   Smoking status: Never   Smokeless tobacco: Never  Vaping Use   Vaping Use: Never used  Substance and Sexual Activity   Alcohol use: Yes    Comment: occassionally   Drug use: Never   Sexual activity: Yes

## 2023-03-11 ENCOUNTER — Other Ambulatory Visit: Payer: Self-pay | Admitting: Physician Assistant

## 2023-03-11 MED ORDER — HYDROCODONE-ACETAMINOPHEN 5-325 MG PO TABS: 2.0000 | ORAL_TABLET | Freq: Three times a day (TID) | ORAL | 0 refills | Status: DC | PRN

## 2023-03-11 MED ORDER — ONDANSETRON HCL 4 MG PO TABS: 4.0000 mg | ORAL_TABLET | Freq: Every day | ORAL | 1 refills | Status: DC | PRN

## 2023-03-13 DIAGNOSIS — G5602 Carpal tunnel syndrome, left upper limb: Secondary | ICD-10-CM | POA: Diagnosis not present

## 2023-03-13 DIAGNOSIS — M65342 Trigger finger, left ring finger: Secondary | ICD-10-CM | POA: Diagnosis not present

## 2023-03-21 ENCOUNTER — Encounter: Payer: 59 | Admitting: Orthopaedic Surgery

## 2023-03-25 ENCOUNTER — Ambulatory Visit (INDEPENDENT_AMBULATORY_CARE_PROVIDER_SITE_OTHER): Payer: 59 | Admitting: Physician Assistant

## 2023-03-25 DIAGNOSIS — M65342 Trigger finger, left ring finger: Secondary | ICD-10-CM

## 2023-03-25 DIAGNOSIS — G5602 Carpal tunnel syndrome, left upper limb: Secondary | ICD-10-CM

## 2023-03-25 MED ORDER — TRAMADOL HCL 50 MG PO TABS: 50.0000 mg | ORAL_TABLET | Freq: Four times a day (QID) | ORAL | 2 refills | Status: DC | PRN

## 2023-03-25 NOTE — Progress Notes (Signed)
   Post-Op Visit Note   Patient: Alicia Booth           Date of Birth: [birth year]           MRN: [id number] Visit Date: 03/25/2023 PCP: Anne Ng, NP   Assessment & Plan:  Chief Complaint:  Chief Complaint  Patient presents with   Left Hand - Routine Post Op   Visit Diagnoses:  1. Left carpal tunnel syndrome   2. Trigger ring finger of left hand     Plan: Patient is a pleasant 51 year old female who comes in today 12 days status post left carpal tunnel release and left ring finger trigger finger release, date of surgery 03/13/2023.  She has been doing okay.  She has been taking Motrin/Tylenol for pain.  Examination of her left hand reveals well-healed surgical incisions with nylon sutures in place.  No evidence of infection or cellulitis.  Fingers warm well-perfused.  Today, sutures were removed and Steri-Strips applied.  I would like to start her in hand therapy as she is having trouble fully extending her ring finger.  Referral has been made.  Have sent in tramadol to take as needed.  She will follow-up with [place] in 3 weeks for recheck.  No heavy lifting or submerging her hand underwater until she is 4 weeks postop.  Call with concerns or questions.  Follow-Up Instructions: Return in about 3 weeks (around 04/15/2023).   Orders:  No orders of the defined types were placed in this encounter.  No orders of the defined types were placed in this encounter.   Imaging: No new imaging  PMFS History: Patient Active Problem List   Diagnosis Date Noted   Chronic diastolic congestive heart failure 01/24/2023   Chronic vaginitis 10/24/2022   Hyperlipidemia associated with type 2 diabetes mellitus 10/24/2022   Chronic tension-type headache, not intractable 05/23/2022   Trigger finger, left little finger 05/23/2022   Takotsubo cardiomyopathy 04/24/2022   S/P gastric sleeve procedure 04/24/2022   Adjustment disorder with mixed anxiety and depressed mood 04/24/2022   Perimenopausal  symptom 04/24/2022   NSTEMI (non-ST elevated myocardial infarction) 04/11/2022   Myopia with astigmatism and presbyopia, bilateral 03/19/2018   Obstructive sleep apnea (adult) (pediatric) 10/13/2012   Diabetes 11/16/2009   Hypertension 11/16/2009   Past Medical History:  Diagnosis Date   Diabetes ([facility])    High cholesterol    Hypertension    Stress-induced cardiomyopathy     Family History  Problem Relation Age of Onset   Breast cancer Mother 37   Heart attack Maternal Grandmother 95    Past Surgical History:  Procedure Laterality Date   BARIATRIC SURGERY  2014   BREAST BIOPSY Left    lymph node bx benign   HEEL SPUR EXCISION     LEFT HEART CATH AND CORONARY ANGIOGRAPHY N/A 04/11/2022   Procedure: LEFT HEART CATH AND CORONARY ANGIOGRAPHY;  Surgeon: Corky Crafts, MD;  Location: [facility];  Service: Cardiovascular;  Laterality: N/A;   MENISCUS REPAIR     tummy tuck     Social History   Occupational History   Not on file  Tobacco Use   Smoking status: Never   Smokeless tobacco: Never  Vaping Use   Vaping Use: Never used  Substance and Sexual Activity   Alcohol use: Yes    Comment: occassionally   Drug use: Never   Sexual activity: Yes

## 2023-04-03 ENCOUNTER — Other Ambulatory Visit: Payer: Self-pay | Admitting: Nurse Practitioner

## 2023-04-03 ENCOUNTER — Other Ambulatory Visit: Payer: Self-pay | Admitting: Cardiology

## 2023-04-03 ENCOUNTER — Telehealth: Payer: Self-pay

## 2023-04-03 NOTE — Telephone Encounter (Signed)
Patient called wanting to know if she should be concerned about her left pinky finger.  Stated that she has a small blood clot that hurts and has a purple color to it.  Patient had left carpal tunnel release and left ring finger trigger finger release on 03/13/2023.  CB# [phone].  Please advise.  Thank you

## 2023-04-03 NOTE — Telephone Encounter (Signed)
Have her send [place] a picture of her hand.  Thanks.

## 2023-04-04 ENCOUNTER — Encounter: Payer: Self-pay | Admitting: Orthopaedic Surgery

## 2023-04-04 NOTE — Telephone Encounter (Signed)
Called patient. She will send a picture through MyChart.

## 2023-04-07 ENCOUNTER — Ambulatory Visit: Payer: 59 | Attending: Physician Assistant | Admitting: Occupational Therapy

## 2023-04-07 DIAGNOSIS — M65342 Trigger finger, left ring finger: Secondary | ICD-10-CM | POA: Insufficient documentation

## 2023-04-07 DIAGNOSIS — M25642 Stiffness of left hand, not elsewhere classified: Secondary | ICD-10-CM

## 2023-04-07 DIAGNOSIS — M6281 Muscle weakness (generalized): Secondary | ICD-10-CM | POA: Diagnosis present

## 2023-04-07 DIAGNOSIS — G5602 Carpal tunnel syndrome, left upper limb: Secondary | ICD-10-CM | POA: Diagnosis not present

## 2023-04-07 DIAGNOSIS — M79642 Pain in left hand: Secondary | ICD-10-CM | POA: Diagnosis not present

## 2023-04-07 NOTE — Patient Instructions (Signed)
  Flexor Tendon Gliding (Active Hook Fist)   With fingers and knuckles straight, bend middle and tip joints. Do not bend large knuckles. Repeat _10-15___ times. Do _4-6___ sessions per day.  MP Flexion (Active)   With back of hand on table, bend large knuckles as far as they will go, keeping small joints straight. Repeat _10-15___ times. Do __4-6__ sessions per day. Activity: Reach into a narrow container.*      Finger Flexion / Extension   With palm up, bend fingers of left hand toward palm, making a  fist. Straighten fingers, opening fist. Repeat sequence _10-15___ times per session. Do _4-6__ sessions per day. Hand Variation: Palm down   Copyright  VHI. All rights reserved.    

## 2023-04-07 NOTE — Therapy (Incomplete)
OUTPATIENT OCCUPATIONAL THERAPY ORTHO EVALUATION  Patient Name: Alicia Booth MRN: [id number] DOB:06-Nov-1971, 52 y.o., female Today's Date: 04/08/2023  PCP: Dr. Elease Etienne REFERRING PROVIDER: Jari Sportsman PA-C, Dr. Roda Shutters  END OF SESSION:  OT End of Session - 04/08/23 0809     Visit Number 1    Number of Visits 17    Date for OT Re-Evaluation 06/10/23    Authorization Type aetna    Authorization - Visit Number 1    Authorization - Number of Visits 60    OT Start Time 1535    OT Stop Time 1623    OT Time Calculation (min) 48 min    Activity Tolerance Patient tolerated treatment well;Patient limited by pain    Behavior During Therapy WFL for tasks assessed/performed             Past Medical History:  Diagnosis Date   Diabetes (HCC)    High cholesterol    Hypertension    Stress-induced cardiomyopathy    Past Surgical History:  Procedure Laterality Date   BARIATRIC SURGERY  2014   BREAST BIOPSY Left    lymph node bx benign   HEEL SPUR EXCISION     LEFT HEART CATH AND CORONARY ANGIOGRAPHY N/A 04/11/2022   Procedure: LEFT HEART CATH AND CORONARY ANGIOGRAPHY;  Surgeon: Corky Crafts, MD;  Location: [facility];  Service: Cardiovascular;  Laterality: N/A;   MENISCUS REPAIR     tummy tuck     Patient Active Problem List   Diagnosis Date Noted   Chronic diastolic congestive heart failure (HCC) 01/24/2023   Chronic vaginitis 10/24/2022   Hyperlipidemia associated with type 2 diabetes mellitus (HCC) 10/24/2022   Chronic tension-type headache, not intractable 05/23/2022   Trigger finger, left little finger 05/23/2022   Takotsubo cardiomyopathy 04/24/2022   S/P gastric sleeve procedure 04/24/2022   Adjustment disorder with mixed anxiety and depressed mood 04/24/2022   Perimenopausal symptom 04/24/2022   NSTEMI (non-ST elevated myocardial infarction) (HCC) 04/11/2022   Myopia with astigmatism and presbyopia, bilateral 03/19/2018   Obstructive sleep apnea (adult)  (pediatric) 10/13/2012   Diabetes (HCC) 11/16/2009   Hypertension 11/16/2009    ONSET DATE: 03/13/23  REFERRING DIAG:  G56.02 (ICD-10-CM) - Left carpal tunnel syndrome  M65.342 (ICD-10-CM) - Trigger ring finger of left hand    THERAPY DIAG:  Pain in left hand - Plan: Ot plan of care cert/re-cert  Stiffness of left hand, not elsewhere classified - Plan: Ot plan of care cert/re-cert  Muscle weakness (generalized) - Plan: Ot plan of care cert/re-cert  Rationale for Evaluation and Treatment: Rehabilitation  SUBJECTIVE:   SUBJECTIVE STATEMENT: Pt reports pain in her hand Pt accompanied by: self  PERTINENT HISTORY: status post left carpal tunnel release and left ring finger trigger finger release, date of surgery 03/13/2023. PMH HTN, DM, CHF  PRECAUTIONS: Other: no heavy lifting or submerging hand until 4 weeks post op  WEIGHT BEARING RESTRICTIONS: Yes no heavy lifting   PAIN:  Are you having pain? Yes: NPRS scale: 4/10 Pain location: hand Pain description: aching Aggravating factors: straightening finger Relieving factors: meds  FALLS: Has patient fallen in last 6 months? No  LIVING ENVIRONMENT: Lives with: lives alone   PLOF: Independent  PATIENT GOALS: improve ROM and pain  NEXT MD VISIT: 04/17/23  OBJECTIVE:   HAND DOMINANCE: Right  ADLs: Overall ADLs: mod I with dominant RUE Transfers/ambulation related to ADLs:independent   FUNCTIONAL OUTCOME MEASURES: Quick Dash: 52.5%  UPPER EXTREMITY ROM:  LUE grossly 85 % composite finger flexion, 90% extension, limited for ring finger, pt opposes all digits  (Blank rows = not tested)  Active ROM Right eval Left eval  Thumb MCP (0-60)    Thumb IP (0-80)    Thumb Radial abd/add (0-55)     Thumb Palmar abd/add (0-45)     Thumb Opposition to Small Finger     Index MCP (0-90)     Index PIP (0-100)     Index DIP (0-70)      Long MCP (0-90)      Long PIP (0-100)      Long DIP (0-70)      Ring MCP (0-90) 80      Ring PIP (0-100)  27flex/ -25 ext    Ring DIP (0-70)      Little MCP (0-90)      Little PIP (0-100)      Little DIP (0-70)      (Blank rows = not tested) 85% composite finger flexion Opposes all digits HAND FUNCTION: Grip strength: Right: NT lbs; Left: NT lbs- grip not tested due to pain and precautions  COORDINATION: Impaired due to pain and stiffness  SENSATION: WFL  EDEMA: moderate edema in L palm incision sites, Pt with 2 small open stiich sites at lower palm incision site. Top incision site at base of ring finger is fully closed  COGNITION: Overall cognitive status: Within functional limits for tasks assessed   OBSERVATIONS: Pt demonstrates significant pain with ROM of ring finger   TODAY'S TREATMENT:                                                                                                                              DATE: 04/07/23-US , 0.8w/cm 2, 20% to ring finger  palm and top incision site which is closed. Therapist worked around lower incision site as it has small open areas. No adverse reactions. Pt reports decreased stiffness after [place]. Pt was instructed in tendon gliding exercise. Tenso grip applied over hand for light compression and finger stockinette over ring finger.    PATIENT EDUCATION: Education details: role of OT, potential goals, tendon gliding exercises Person educated: Patient Education method: Explanation, Demonstration, Verbal cues, and Handouts Education comprehension: verbalized understanding, returned demonstration, and verbal cues required  HOME EXERCISE PROGRAM: 04/07/23-tendon gliding  GOALS: Goals reviewed with patient? Yes  SHORT TERM GOALS: Target date: 05/06/23  I with initial HEP  Goal status: INITIAL  2.  I with scar massage, edema control techniques   Goal status: INITIAL  3.  Pt will demonstrate extensor lag of no mor than -5 for left ring finger. Baseline: -25 Goal status: INITIAL  4.  Pt will demonstrate full  composite finger flexion for increased ease with ADLs. Baseline: 85% Goal status: INITIAL    LONG TERM GOALS: Target date: 06/10/23  I with updated HEP.   Goal status: INITIAL  2.  Pt will improve Quick Sharilyn Sites  score to 45% or better. Baseline: 52.5% disability Goal status: INITIAL  3.  Pt will demonstrate grip strength of at least 25 lbs for increased functional use.  Goal status: INITIAL  4.  Pt will resume use of LUE as a non dominant assist for ADLs/IADLS with pain no greater than 2/10. Baseline: not currently using consistently, pain 4/10 Goal status: INITIAL    ASSESSMENT:  CLINICAL IMPRESSION: Patient is a 52 y.o. female who was seen today for occupational therapy evaluation for  G56.02 (ICD-10-CM) - Left carpal tunnel syndrome  M65.342 (ICD-10-CM) - Trigger ring finger of left hand  Pt s/p release on 03/13/23 by Dr. Roda Shutters.. Pt can benefit from skilled occupational therapy to maximize functional use of LUE for ADLs/IADLs.  PERFORMANCE DEFICITS: in functional skills including ADLs, IADLs, coordination, dexterity, edema, ROM, strength, pain, fascial restrictions, flexibility, Fine motor control, Gross motor control, endurance, decreased knowledge of precautions, decreased knowledge of use of DME, skin integrity, and UE functional use,  , and psychosocial skills including coping strategies, environmental adaptation, habits, interpersonal interactions, and routines and behaviors.   IMPAIRMENTS: are limiting patient from ADLs, IADLs, rest and sleep, education, work, play, leisure, and social participation.   COMORBIDITIES: may have co-morbidities  that affects occupational performance. Patient will benefit from skilled OT to address above impairments and improve overall function.  MODIFICATION OR ASSISTANCE TO COMPLETE EVALUATION: No modification of tasks or assist necessary to complete an evaluation.  OT OCCUPATIONAL PROFILE AND HISTORY: Detailed assessment: Review of records and  additional review of physical, cognitive, psychosocial history related to current functional performance.  CLINICAL DECISION MAKING: LOW - limited treatment options, no task modification necessary  REHAB POTENTIAL: Good  EVALUATION COMPLEXITY: Low      PLAN:  OT FREQUENCY: 2x/week  OT DURATION: 8 weeksplus eval- anticipate d/c after 4-6 weeks  PLANNED INTERVENTIONS: self care/ADL training, therapeutic exercise, therapeutic activity, neuromuscular re-education, manual therapy, scar mobilization, manual lymph drainage, passive range of motion, balance training, splinting, electrical stimulation, ultrasound, paraffin, moist heat, cryotherapy, contrast bath, patient/family education, energy conservation, coping strategies training, DME and/or AE instructions, and Re-evaluation  RECOMMENDED OTHER SERVICES: n/a  CONSULTED AND AGREED WITH PLAN OF CARE: Patient  PLAN FOR NEXT SESSION: progress HEP, [place], scar massage   Floyce Bujak, OT 04/08/2023, 8:35 AM

## 2023-04-10 ENCOUNTER — Ambulatory Visit: Payer: 59 | Attending: Nurse Practitioner | Admitting: Occupational Therapy

## 2023-04-10 ENCOUNTER — Encounter: Payer: Self-pay | Admitting: Occupational Therapy

## 2023-04-10 DIAGNOSIS — M79642 Pain in left hand: Secondary | ICD-10-CM

## 2023-04-10 DIAGNOSIS — M6281 Muscle weakness (generalized): Secondary | ICD-10-CM | POA: Insufficient documentation

## 2023-04-10 DIAGNOSIS — M25642 Stiffness of left hand, not elsewhere classified: Secondary | ICD-10-CM | POA: Insufficient documentation

## 2023-04-10 NOTE — Patient Instructions (Signed)
AROM: Wrist Extension   .  With ___left_ palm down, bend wrist up. Repeat __15__ times per set.  Do __3-4_ sessions per day.  Composite Extension (Passive Flexor Stretch)    Sitting with elbows on table and palms together, slowly lower wrists toward table until stretch is felt. Be sure to keep palms together throughout stretch. Hold ____ seconds. Relax. Repeat _10  times. Do _3-4___ sessions per day.  Copyright  VHI. All rights reserved.    PIP Extension (Passive    Use thumb of other hand on top of joint and two fingers under- neath on either side to straighten middle joint of ___ring___ finger. Hold __5-10__ seconds. Repeat ___10  times. Do __3-4__ sessions per day.  Copyright  VHI. All rights reserved.

## 2023-04-10 NOTE — Therapy (Signed)
OUTPATIENT OCCUPATIONAL THERAPY ORTHO treatment  Patient Name: Alicia Booth MRN: [id number] DOB:[birth year], 52 y.o., female Today's Date: 04/10/2023  PCP: Dr. Elease Etienne REFERRING PROVIDER: Jari Sportsman PA-C, Dr. Roda Shutters  END OF SESSION:  OT End of Session - 04/10/23 1225     Visit Number 2    Number of Visits 17    Date for OT Re-Evaluation 06/10/23    Authorization Type aetna    Authorization - Visit Number 2    Authorization - Number of Visits 60    OT Start Time 1230    OT Stop Time 1315    OT Time Calculation (min) 45 min    Activity Tolerance Patient tolerated treatment well;Patient limited by pain    Behavior During Therapy WFL for tasks assessed/performed             Past Medical History:  Diagnosis Date   Diabetes (HCC)    High cholesterol    Hypertension    Stress-induced cardiomyopathy    Past Surgical History:  Procedure Laterality Date   BARIATRIC SURGERY  2014   BREAST BIOPSY Left    lymph node bx benign   HEEL SPUR EXCISION     LEFT HEART CATH AND CORONARY ANGIOGRAPHY N/A 04/11/2022   Procedure: LEFT HEART CATH AND CORONARY ANGIOGRAPHY;  Surgeon: Corky Crafts, MD;  Location: [facility];  Service: Cardiovascular;  Laterality: N/A;   MENISCUS REPAIR     tummy tuck     Patient Active Problem List   Diagnosis Date Noted   Chronic diastolic congestive heart failure (HCC) 01/24/2023   Chronic vaginitis 10/24/2022   Hyperlipidemia associated with type 2 diabetes mellitus (HCC) 10/24/2022   Chronic tension-type headache, not intractable 05/23/2022   Trigger finger, left little finger 05/23/2022   Takotsubo cardiomyopathy 04/24/2022   S/P gastric sleeve procedure 04/24/2022   Adjustment disorder with mixed anxiety and depressed mood 04/24/2022   Perimenopausal symptom 04/24/2022   NSTEMI (non-ST elevated myocardial infarction) (HCC) 04/11/2022   Myopia with astigmatism and presbyopia, bilateral 03/19/2018   Obstructive sleep apnea (adult)  (pediatric) 10/13/2012   Diabetes (HCC) 11/16/2009   Hypertension 11/16/2009    ONSET DATE: 03/13/23  REFERRING DIAG:  G56.02 (ICD-10-CM) - Left carpal tunnel syndrome  M65.342 (ICD-10-CM) - Trigger ring finger of left hand    THERAPY DIAG:  Pain in left hand  Stiffness of left hand, not elsewhere classified  Muscle weakness (generalized)  Rationale for Evaluation and Treatment: Rehabilitation  SUBJECTIVE:   SUBJECTIVE STATEMENT: Pt reports  significant pain overnight Pt accompanied by: self  PERTINENT HISTORY: status post left carpal tunnel release and left ring finger trigger finger release, date of surgery 03/13/2023. PMH HTN, DM, CHF  PRECAUTIONS: Other: no heavy lifting or submerging hand until 4 weeks post op  WEIGHT BEARING RESTRICTIONS: Yes no heavy lifting   PAIN:  Are you having pain? Yes: NPRS scale: 4/10 Pain location: hand Pain description: aching Aggravating factors: straightening finger Relieving factors: meds  FALLS: Has patient fallen in last 6 months? No  LIVING ENVIRONMENT: Lives with: lives alone   PLOF: Independent  PATIENT GOALS: improve ROM and pain  NEXT MD VISIT: 04/17/23  OBJECTIVE:   HAND DOMINANCE: Right  ADLs: Overall ADLs: mod I with dominant RUE Transfers/ambulation related to ADLs:independent   FUNCTIONAL OUTCOME MEASURES: Quick Dash: 52.5%  UPPER EXTREMITY ROM:   LUE grossly 85 % composite finger flexion, 90% extension, limited for ring finger, pt opposes all digits  (Blank rows =  not tested)  Active ROM Right eval Left eval  Thumb MCP (0-60)    Thumb IP (0-80)    Thumb Radial abd/add (0-55)     Thumb Palmar abd/add (0-45)     Thumb Opposition to Small Finger     Index MCP (0-90)     Index PIP (0-100)     Index DIP (0-70)      Long MCP (0-90)      Long PIP (0-100)      Long DIP (0-70)      Ring MCP (0-90) 80     Ring PIP (0-100)  92flex/ -25 ext    Ring DIP (0-70)      Little MCP (0-90)      Little PIP  (0-100)      Little DIP (0-70)      (Blank rows = not tested) 85% composite finger flexion Opposes all digits HAND FUNCTION: Grip strength: Right: NT lbs; Left: NT lbs- grip not tested due to pain and precautions  COORDINATION: Impaired due to pain and stiffness  SENSATION: WFL  EDEMA: moderate edema in L palm incision sites, Pt with 2 small open stiich sites at lower palm incision site. Top incision site at base of ring finger is fully closed  COGNITION: Overall cognitive status: Within functional limits for tasks assessed   OBSERVATIONS: Pt demonstrates significant pain with ROM of ring finger   TODAY'S TREATMENT:                                                                                                                              DATE: 04/10/23-US , 0.8w/cm 2, 20% to ring palm and incision sites as  incision sites are closed. Tendon gliding exercises, PIP blocking exercises, DIP blocking, P/ROM to PIP in extension and active rist flexion/ extension, passive wrist extension, min v.c and demonstration  Scar massage to palmar incision sites, and ring and pt was instructed scar massage for home.  04/07/23-US , 0.8w/cm 2, 20% to ring finger,   palm and top incision site which is closed. Therapist worked around lower incision site as it has small open areas. No adverse reactions. Pt reports decreased stiffness after [place]. Pt was instructed in tendon gliding exercise. Tenso grip applied over hand for light compression and finger stockinette over ring finger.    PATIENT EDUCATION: Education details: scar massage, tendon gliding exercises, updated HEP -see pt instruction. Pt was instructed to wear brace for comfort, and at night. Person educated: Patient Education method: Explanation, Demonstration, Verbal cues, and Handouts Education comprehension: verbalized understanding, returned demonstration, and verbal cues required  HOME EXERCISE PROGRAM: 04/07/23-tendon  gliding  GOALS: Goals reviewed with patient? Yes  SHORT TERM GOALS: Target date: 05/06/23  I with initial HEP  Goal status: INITIAL  2.  I with scar massage, edema control techniques   Goal status: INITIAL  3.  Pt will demonstrate extensor lag of no mor than -5 for left ring  finger. Baseline: -25 Goal status: INITIAL  4.  Pt will demonstrate full composite finger flexion for increased ease with ADLs. Baseline: 85% Goal status: INITIAL    LONG TERM GOALS: Target date: 06/10/23  I with updated HEP.   Goal status: INITIAL  2.  Pt will improve Quick Dash score to 45% or better. Baseline: 52.5% disability Goal status: INITIAL  3.  Pt will demonstrate grip strength of at least 25 lbs for increased functional use.  Goal status: INITIAL  4.  Pt will resume use of LUE as a non dominant assist for ADLs/IADLS with pain no greater than 2/10. Baseline: not currently using consistently, pain 4/10 Goal status: INITIAL    ASSESSMENT:  CLINICAL IMPRESSION: Pt is progressing towards goals with improving ROM.  PERFORMANCE DEFICITS: in functional skills including ADLs, IADLs, coordination, dexterity, edema, ROM, strength, pain, fascial restrictions, flexibility, Fine motor control, Gross motor control, endurance, decreased knowledge of precautions, decreased knowledge of use of DME, skin integrity, and UE functional use,  , and psychosocial skills including coping strategies, environmental adaptation, habits, interpersonal interactions, and routines and behaviors.   IMPAIRMENTS: are limiting patient from ADLs, IADLs, rest and sleep, education, work, play, leisure, and social participation.   COMORBIDITIES: may have co-morbidities  that affects occupational performance. Patient will benefit from skilled OT to address above impairments and improve overall function.  MODIFICATION OR ASSISTANCE TO COMPLETE EVALUATION: No modification of tasks or assist necessary to complete an  evaluation.  OT OCCUPATIONAL PROFILE AND HISTORY: Detailed assessment: Review of records and additional review of physical, cognitive, psychosocial history related to current functional performance.  CLINICAL DECISION MAKING: LOW - limited treatment options, no task modification necessary  REHAB POTENTIAL: Good  EVALUATION COMPLEXITY: Low      PLAN:  OT FREQUENCY: 2x/week  OT DURATION: 8 weeksplus eval- anticipate d/c after 4-6 weeks  PLANNED INTERVENTIONS: self care/ADL training, therapeutic exercise, therapeutic activity, neuromuscular re-education, manual therapy, scar mobilization, manual lymph drainage, passive range of motion, balance training, splinting, electrical stimulation, ultrasound, paraffin, moist heat, cryotherapy, contrast bath, patient/family education, energy conservation, coping strategies training, DME and/or AE instructions, and Re-evaluation  RECOMMENDED OTHER SERVICES: n/a  CONSULTED AND AGREED WITH PLAN OF CARE: Patient  PLAN FOR NEXT SESSION: progress HEP, [place], scar massage   Samiha Denapoli, OT 04/10/2023, 12:26 PM

## 2023-04-17 ENCOUNTER — Ambulatory Visit (INDEPENDENT_AMBULATORY_CARE_PROVIDER_SITE_OTHER): Payer: 59 | Admitting: Physician Assistant

## 2023-04-17 DIAGNOSIS — M65342 Trigger finger, left ring finger: Secondary | ICD-10-CM

## 2023-04-17 DIAGNOSIS — Z9889 Other specified postprocedural states: Secondary | ICD-10-CM

## 2023-04-17 MED ORDER — NAPROXEN 500 MG PO TABS: ORAL_TABLET | ORAL | 2 refills | Status: DC

## 2023-04-17 NOTE — Progress Notes (Signed)
Post-Op Visit Note   Patient: Alicia Booth           Date of Birth: 1971-12-09           MRN: [id number] Visit Date: 04/17/2023 PCP: Anne Ng, NP   Assessment & Plan:  Chief Complaint:  Chief Complaint  Patient presents with   Left Hand - Routine Post Op   Left Ring Finger - Routine Post Op   Visit Diagnoses:  1. S/P carpal tunnel release   2. Trigger finger, left ring finger     Plan: Patient is a pleasant 52 year old female who comes in today 4 weeks status post left carpal tunnel release and left ring trigger finger release, date of surgery 03/13/2023.  Her pain has improved but she still has persistent swelling to the A1 pulley of the ring finger.  This is somewhat painful for which she occasionally takes tramadol.  She has been in hand therapy where she is making progress.  She denies any paresthesias.  Examination of the left hand reveals well-healed surgical scars without complication.  Fingers are warm well-perfused.  She does have moderate swelling and mild tenderness to the A1 pulley of the long finger.  At this point, she will continue with hand therapy.  I have sent in Naprosyn.  She will follow-up with [place] in 4 weeks for recheck.  Call with concerns or questions in the meantime.  Follow-Up Instructions: Return if symptoms worsen or fail to improve.   Orders:  No orders of the defined types were placed in this encounter.  Meds ordered this encounter  Medications   naproxen (NAPROSYN) 500 MG tablet    Sig: Take one time bid x 2 weeks and then prn    Dispense:  60 tablet    Refill:  2    Imaging: No new imaging  PMFS History: Patient Active Problem List   Diagnosis Date Noted   Chronic diastolic congestive heart failure (HCC) 01/24/2023   Chronic vaginitis 10/24/2022   Hyperlipidemia associated with type 2 diabetes mellitus (HCC) 10/24/2022   Chronic tension-type headache, not intractable 05/23/2022   Trigger finger, left little finger 05/23/2022    Takotsubo cardiomyopathy 04/24/2022   S/P gastric sleeve procedure 04/24/2022   Adjustment disorder with mixed anxiety and depressed mood 04/24/2022   Perimenopausal symptom 04/24/2022   NSTEMI (non-ST elevated myocardial infarction) (HCC) 04/11/2022   Myopia with astigmatism and presbyopia, bilateral 03/19/2018   Obstructive sleep apnea (adult) (pediatric) 10/13/2012   Diabetes (HCC) 11/16/2009   Hypertension 11/16/2009   Past Medical History:  Diagnosis Date   Diabetes (HCC)    High cholesterol    Hypertension    Stress-induced cardiomyopathy     Family History  Problem Relation Age of Onset   Breast cancer Mother 57   Heart attack Maternal Grandmother 78    Past Surgical History:  Procedure Laterality Date   BARIATRIC SURGERY  2014   BREAST BIOPSY Left    lymph node bx benign   HEEL SPUR EXCISION     LEFT HEART CATH AND CORONARY ANGIOGRAPHY N/A 04/11/2022   Procedure: LEFT HEART CATH AND CORONARY ANGIOGRAPHY;  Surgeon: Corky Crafts, MD;  Location: [facility];  Service: Cardiovascular;  Laterality: N/A;   MENISCUS REPAIR     tummy tuck     Social History   Occupational History   Not on file  Tobacco Use   Smoking status: Never   Smokeless tobacco: Never  Vaping Use  Vaping Use: Never used  Substance and Sexual Activity   Alcohol use: Yes    Comment: occassionally   Drug use: Never   Sexual activity: Yes

## 2023-04-21 ENCOUNTER — Ambulatory Visit: Payer: 59 | Admitting: Occupational Therapy

## 2023-04-21 DIAGNOSIS — M79642 Pain in left hand: Secondary | ICD-10-CM

## 2023-04-21 DIAGNOSIS — M6281 Muscle weakness (generalized): Secondary | ICD-10-CM

## 2023-04-21 DIAGNOSIS — M25642 Stiffness of left hand, not elsewhere classified: Secondary | ICD-10-CM

## 2023-04-21 NOTE — Therapy (Addendum)
OUTPATIENT OCCUPATIONAL THERAPY ORTHO treatment  Patient Name: Alicia Booth MRN: [id number] DOB:July 23, 1971, 52 y.o., female Today's Date: 04/21/2023  PCP: Dr. Elease Etienne REFERRING PROVIDER: Jari Sportsman PA-C, Dr. Roda Shutters  END OF SESSION:  OT End of Session - 04/21/23 1614     Visit Number 3    Number of Visits 17    Date for OT Re-Evaluation 06/10/23    Authorization Type aetna    Authorization - Visit Number 3    Authorization - Number of Visits 60    OT Start Time 1400    OT Stop Time 1445    OT Time Calculation (min) 45 min              Past Medical History:  Diagnosis Date   Diabetes (HCC)    High cholesterol    Hypertension    Stress-induced cardiomyopathy    Past Surgical History:  Procedure Laterality Date   BARIATRIC SURGERY  2014   BREAST BIOPSY Left    lymph node bx benign   HEEL SPUR EXCISION     LEFT HEART CATH AND CORONARY ANGIOGRAPHY N/A 04/11/2022   Procedure: LEFT HEART CATH AND CORONARY ANGIOGRAPHY;  Surgeon: Corky Crafts, MD;  Location: [facility];  Service: Cardiovascular;  Laterality: N/A;   MENISCUS REPAIR     tummy tuck     Patient Active Problem List   Diagnosis Date Noted   Chronic diastolic congestive heart failure (HCC) 01/24/2023   Chronic vaginitis 10/24/2022   Hyperlipidemia associated with type 2 diabetes mellitus (HCC) 10/24/2022   Chronic tension-type headache, not intractable 05/23/2022   Trigger finger, left little finger 05/23/2022   Takotsubo cardiomyopathy 04/24/2022   S/P gastric sleeve procedure 04/24/2022   Adjustment disorder with mixed anxiety and depressed mood 04/24/2022   Perimenopausal symptom 04/24/2022   NSTEMI (non-ST elevated myocardial infarction) (HCC) 04/11/2022   Myopia with astigmatism and presbyopia, bilateral 03/19/2018   Obstructive sleep apnea (adult) (pediatric) 10/13/2012   Diabetes (HCC) 11/16/2009   Hypertension 11/16/2009    ONSET DATE: 03/13/23  REFERRING DIAG:  G56.02 (ICD-10-CM) -  Left carpal tunnel syndrome  M65.342 (ICD-10-CM) - Trigger ring finger of left hand    THERAPY DIAG:  Pain in left hand  Stiffness of left hand, not elsewhere classified  Muscle weakness (generalized)  Rationale for Evaluation and Treatment: Rehabilitation  SUBJECTIVE:   SUBJECTIVE STATEMENT: Pt reports  she has had pain and swelling, MD is starting her on naproxen Pt accompanied by: self  PERTINENT HISTORY: status post left carpal tunnel release and left ring finger trigger finger release, date of surgery 03/13/2023. PMH HTN, DM, CHF  PRECAUTIONS: Other: no heavy lifting or submerging hand until 4 weeks post op  WEIGHT BEARING RESTRICTIONS: Yes no heavy lifting   PAIN:  Are you having pain? Yes: NPRS scale: 4/10 Pain location: hand Pain description: aching Aggravating factors: straightening finger Relieving factors: meds  FALLS: Has patient fallen in last 6 months? No  LIVING ENVIRONMENT: Lives with: lives alone   PLOF: Independent  PATIENT GOALS: improve ROM and pain  NEXT MD VISIT: 04/17/23  OBJECTIVE:   HAND DOMINANCE: Right  ADLs: Overall ADLs: mod I with dominant RUE Transfers/ambulation related to ADLs:independent   FUNCTIONAL OUTCOME MEASURES: Quick Dash: 52.5%  UPPER EXTREMITY ROM:   LUE grossly 85 % composite finger flexion, 90% extension, limited for ring finger, pt opposes all digits  (Blank rows = not tested)  Active ROM Right eval Left eval  Thumb MCP (  0-60)    Thumb IP (0-80)    Thumb Radial abd/add (0-55)     Thumb Palmar abd/add (0-45)     Thumb Opposition to Small Finger     Index MCP (0-90)     Index PIP (0-100)     Index DIP (0-70)      Long MCP (0-90)      Long PIP (0-100)      Long DIP (0-70)      Ring MCP (0-90) 80     Ring PIP (0-100)  72flex/ -25 ext    Ring DIP (0-70)      Little MCP (0-90)      Little PIP (0-100)      Little DIP (0-70)      (Blank rows = not tested) 85% composite finger flexion Opposes all  digits HAND FUNCTION: Grip strength: Right: NT lbs; Left: NT lbs- grip not tested due to pain and precautions  COORDINATION: Impaired due to pain and stiffness  SENSATION: WFL  EDEMA: moderate edema in L palm incision sites, Pt with 2 small open stiich sites at lower palm incision site. Top incision site at base of ring finger is fully closed  COGNITION: Overall cognitive status: Within functional limits for tasks assessed   OBSERVATIONS: Pt demonstrates significant pain with ROM of ring finger   TODAY'S TREATMENT:                                                                                                                              DATE: 04/21/23- Pt with continues edema at palmar incision sites [place] , 0.8w/cm 2, 20% to ring palm and incision sites as  incision sites are closed. Paraffin x 8 mins to left hand for pain and stiffness. Tendon gliding exercises, PIP blocking exercises,, P/ROM to PIP in extension and active wrist flexion/ extension, passive wrist extension prayer position, min v.c and demonstration  Cupping to  palmar incision for scar mobilization  Gel pads applied under compression glove at incision sites which are now closed  04/10/23-US , 0.8w/cm 2, 20% to ring palm and incision sites as  incision sites are closed. Tendon gliding exercises, PIP blocking exercises, DIP blocking, P/ROM to PIP in extension and active wrist flexion/ extension, passive wrist extension, min v.c and demonstration  Scar massage to palmar incision sites, and ring and pt was instructed scar massage for home.  04/07/23-US , 0.8w/cm 2, 20% to ring finger,   palm and top incision site which is closed. Therapist worked around lower incision site as it has small open areas. No adverse reactions. Pt reports decreased stiffness after [place]. Pt was instructed in tendon gliding exercise. Tenso grip applied over hand for light compression and finger stockinette over ring finger.    PATIENT  EDUCATION: Education details: Pt was instructed to exercise several times a day instead of 1 long exercises session. Pt was instructed if she uses heat to follow with exercise. Pt was  told to continue scar massage to incision sites and she can perform cupping gently if swelling is better.Pt was instructed to wear brace for comfort at night. Person educated: Patient Education method: Explanation, Demonstration, Verbal cues, Education comprehension: verbalized understanding, returned demonstration, and verbal cues required  HOME EXERCISE PROGRAM: 04/07/23-tendon gliding  GOALS: Goals reviewed with patient? Yes  SHORT TERM GOALS: Target date: 05/06/23  I with initial HEP  Goal status: INITIAL  2.  I with scar massage, edema control techniques   Goal status: INITIAL  3.  Pt will demonstrate extensor lag of no mor than -5 for left ring finger. Baseline: -25 Goal status: INITIAL  4.  Pt will demonstrate full composite finger flexion for increased ease with ADLs. Baseline: 85% Goal status: INITIAL    LONG TERM GOALS: Target date: 06/10/23  I with updated HEP.   Goal status: INITIAL  2.  Pt will improve Quick Dash score to 45% or better. Baseline: 52.5% disability Goal status: INITIAL  3.  Pt will demonstrate grip strength of at least 25 lbs for increased functional use.  Goal status: INITIAL  4.  Pt will resume use of LUE as a non dominant assist for ADLs/IADLS with pain no greater than 2/10. Baseline: not currently using consistently, pain 4/10 Goal status: INITIAL    ASSESSMENT:  CLINICAL IMPRESSION: Pt is progressing towards goals with improving ROM and pain was significantly improved at end of session today.  PERFORMANCE DEFICITS: in functional skills including ADLs, IADLs, coordination, dexterity, edema, ROM, strength, pain, fascial restrictions, flexibility, Fine motor control, Gross motor control, endurance, decreased knowledge of precautions, decreased knowledge  of use of DME, skin integrity, and UE functional use,  , and psychosocial skills including coping strategies, environmental adaptation, habits, interpersonal interactions, and routines and behaviors.   IMPAIRMENTS: are limiting patient from ADLs, IADLs, rest and sleep, education, work, play, leisure, and social participation.   COMORBIDITIES: may have co-morbidities  that affects occupational performance. Patient will benefit from skilled OT to address above impairments and improve overall function.  MODIFICATION OR ASSISTANCE TO COMPLETE EVALUATION: No modification of tasks or assist necessary to complete an evaluation.  OT OCCUPATIONAL PROFILE AND HISTORY: Detailed assessment: Review of records and additional review of physical, cognitive, psychosocial history related to current functional performance.  CLINICAL DECISION MAKING: LOW - limited treatment options, no task modification necessary  REHAB POTENTIAL: Good  EVALUATION COMPLEXITY: Low      PLAN:  OT FREQUENCY: 2x/week  OT DURATION: 8 weeksplus eval- anticipate d/c after 4-6 weeks  PLANNED INTERVENTIONS: self care/ADL training, therapeutic exercise, therapeutic activity, neuromuscular re-education, manual therapy, scar mobilization, manual lymph drainage, passive range of motion, balance training, splinting, electrical stimulation, ultrasound, paraffin, moist heat, cryotherapy, contrast bath, patient/family education, energy conservation, coping strategies training, DME and/or AE instructions, and Re-evaluation  RECOMMENDED OTHER SERVICES: n/a  CONSULTED AND AGREED WITH PLAN OF CARE: Patient  PLAN FOR NEXT SESSION: progress HEP, [place], scar massage   Marleen Moret, OT 04/21/2023, 4:24 PM

## 2023-04-23 ENCOUNTER — Encounter: Payer: Self-pay | Admitting: Occupational Therapy

## 2023-04-23 ENCOUNTER — Ambulatory Visit: Payer: 59 | Admitting: Occupational Therapy

## 2023-04-23 DIAGNOSIS — M25642 Stiffness of left hand, not elsewhere classified: Secondary | ICD-10-CM

## 2023-04-23 DIAGNOSIS — M79642 Pain in left hand: Secondary | ICD-10-CM

## 2023-04-23 DIAGNOSIS — M6281 Muscle weakness (generalized): Secondary | ICD-10-CM

## 2023-04-23 NOTE — Therapy (Signed)
OUTPATIENT OCCUPATIONAL THERAPY ORTHO treatment  Patient Name: Alicia Booth MRN: [id number] DOB:07-12-1971, 52 y.o., female Today's Date: 04/23/2023  PCP: Dr. Elease Etienne REFERRING PROVIDER: Jari Sportsman PA-C, Dr. Roda Shutters  END OF SESSION:  OT End of Session - 04/23/23 1401     Visit Number 4    Number of Visits 17    Date for OT Re-Evaluation 06/10/23    Authorization Type aetna    Authorization - Visit Number 4    Authorization - Number of Visits 60    OT Start Time 1358    OT Stop Time 1440    OT Time Calculation (min) 42 min    Activity Tolerance Patient tolerated treatment well;Patient limited by pain    Behavior During Therapy WFL for tasks assessed/performed               Past Medical History:  Diagnosis Date   Diabetes (HCC)    High cholesterol    Hypertension    Stress-induced cardiomyopathy    Past Surgical History:  Procedure Laterality Date   BARIATRIC SURGERY  2014   BREAST BIOPSY Left    lymph node bx benign   HEEL SPUR EXCISION     LEFT HEART CATH AND CORONARY ANGIOGRAPHY N/A 04/11/2022   Procedure: LEFT HEART CATH AND CORONARY ANGIOGRAPHY;  Surgeon: Corky Crafts, MD;  Location: [facility];  Service: Cardiovascular;  Laterality: N/A;   MENISCUS REPAIR     tummy tuck     Patient Active Problem List   Diagnosis Date Noted   Chronic diastolic congestive heart failure (HCC) 01/24/2023   Chronic vaginitis 10/24/2022   Hyperlipidemia associated with type 2 diabetes mellitus (HCC) 10/24/2022   Chronic tension-type headache, not intractable 05/23/2022   Trigger finger, left little finger 05/23/2022   Takotsubo cardiomyopathy 04/24/2022   S/P gastric sleeve procedure 04/24/2022   Adjustment disorder with mixed anxiety and depressed mood 04/24/2022   Perimenopausal symptom 04/24/2022   NSTEMI (non-ST elevated myocardial infarction) (HCC) 04/11/2022   Myopia with astigmatism and presbyopia, bilateral 03/19/2018   Obstructive sleep apnea (adult)  (pediatric) 10/13/2012   Diabetes (HCC) 11/16/2009   Hypertension 11/16/2009    ONSET DATE: 03/13/23  REFERRING DIAG:  G56.02 (ICD-10-CM) - Left carpal tunnel syndrome  M65.342 (ICD-10-CM) - Trigger ring finger of left hand    THERAPY DIAG:  Pain in left hand  Stiffness of left hand, not elsewhere classified  Muscle weakness (generalized)  Rationale for Evaluation and Treatment: Rehabilitation  SUBJECTIVE:   SUBJECTIVE STATEMENT: Pt reports Monday's treatment really helped Pt accompanied by: self  PERTINENT HISTORY: status post left carpal tunnel release and left ring finger trigger finger release, date of surgery 03/13/2023. PMH HTN, DM, CHF  PRECAUTIONS: Other: no heavy lifting or submerging hand until 4 weeks post op  WEIGHT BEARING RESTRICTIONS: Yes no heavy lifting   PAIN:  Are you having pain? Yes: NPRS scale: 2/10 Pain location: hand Pain description: aching Aggravating factors: straightening finger Relieving factors: meds  FALLS: Has patient fallen in last 6 months? No  LIVING ENVIRONMENT: Lives with: lives alone   PLOF: Independent  PATIENT GOALS: improve ROM and pain  NEXT MD VISIT: JUNE 8  OBJECTIVE:   HAND DOMINANCE: Right  ADLs: Overall ADLs: mod I with dominant RUE Transfers/ambulation related to ADLs:independent   FUNCTIONAL OUTCOME MEASURES: Quick Dash: 52.5%  UPPER EXTREMITY ROM:   LUE grossly 85 % composite finger flexion, 90% extension, limited for ring finger, pt opposes all digits  (  Blank rows = not tested)  Active ROM Right eval Left eval  Thumb MCP (0-60)    Thumb IP (0-80)    Thumb Radial abd/add (0-55)     Thumb Palmar abd/add (0-45)     Thumb Opposition to Small Finger     Index MCP (0-90)     Index PIP (0-100)     Index DIP (0-70)      Long MCP (0-90)      Long PIP (0-100)      Long DIP (0-70)      Ring MCP (0-90) 80     Ring PIP (0-100)  71flex/ -25 ext    Ring DIP (0-70)      Little MCP (0-90)      Little  PIP (0-100)      Little DIP (0-70)      (Blank rows = not tested) 85% composite finger flexion Opposes all digits HAND FUNCTION: Grip strength: Right: NT lbs; Left: NT lbs- grip not tested due to pain and precautions  COORDINATION: Impaired due to pain and stiffness  SENSATION: WFL  EDEMA: moderate edema in L palm incision sites, Pt with 2 small open stiich sites at lower palm incision site. Top incision site at base of ring finger is fully closed  COGNITION: Overall cognitive status: Within functional limits for tasks assessed   OBSERVATIONS: Pt demonstrates significant pain with ROM of ring finger   TODAY'S TREATMENT:                                                                                                                              DATE: 04/23/23 [place] , 0.8w/cm 2, 20% to ring finger palm and incision sites as  incision sites are closed. Paraffin x 8 mins to left hand for pain and stiffness. Tendon gliding exercises, PIP blocking exercises,active wrist flexion/ extension, passive wrist extension- prayer position, and passive wrist flexion, reverse blocking min v.c and demonstration  Flipping playing cards, flicking playing cards, dealing playing cards for improved functional use of LUE  04/21/23- Pt with continues edema at palmar incision sites [place] , 0.8w/cm 2, 20% to ring palm and incision sites as  incision sites are closed. Paraffin x 8 mins to left hand for pain and stiffness. Tendon gliding exercises, PIP blocking exercises,, P/ROM to PIP in extension and active wrist flexion/ extension, passive wrist extension prayer position, min v.c and demonstration  Cupping to  palmar incision for scar mobilization  Gel pads applied under compression glove at incision sites which are now closed  04/10/23-US , 0.8w/cm 2, 20% to ring palm and incision sites as  incision sites are closed. Tendon gliding exercises, PIP blocking exercises, DIP blocking, P/ROM to PIP in  extension and active wrist flexion/ extension, passive wrist extension, min v.c and demonstration  Scar massage to palmar incision sites, and ring and pt was instructed scar massage for home.  04/07/23-US , 0.8w/cm 2, 20% to ring finger,  palm and top incision site which is closed. Therapist worked around lower incision site as it has small open areas. No adverse reactions. Pt reports decreased stiffness after [place]. Pt was instructed in tendon gliding exercise. Tenso grip applied over hand for light compression and finger stockinette over ring finger.    PATIENT EDUCATION: Education details: see above Person educated: Patient Education method: Solicitor, Verbal cues, Education comprehension: verbalized understanding, returned demonstration, and verbal cues required  HOME EXERCISE PROGRAM: 04/07/23-tendon gliding  GOALS: Goals reviewed with patient? Yes  SHORT TERM GOALS: Target date: 05/06/23  I with initial HEP  Goal status:met, 04/23/23  2.  I with scar massage, edema control techniques   Goal status: met, 04/23/23  3.  Pt will demonstrate extensor lag of no mor than -5 for left ring finger. Baseline: -25 Goal status: -5 met 04/22/14  4.  Pt will demonstrate full composite finger flexion for increased ease with ADLs. Baseline: 85% Goal status:  ongoing, 95% 04/23/23    LONG TERM GOALS: Target date: 06/10/23  I with updated HEP.   Goal status: INITIAL  2.  Pt will improve Quick Dash score to 45% or better. Baseline: 52.5% disability Goal status: INITIAL  3.  Pt will demonstrate grip strength of at least 25 lbs for increased functional use.  Goal status: INITIAL  4.  Pt will resume use of LUE as a non dominant assist for ADLs/IADLS with pain no greater than 2/10. Baseline: not currently using consistently, pain 4/10 Goal status: INITIAL    ASSESSMENT:  CLINICAL IMPRESSION: Pt is progressing towards goals with improving A/ROM and light  functional use.  PERFORMANCE DEFICITS: in functional skills including ADLs, IADLs, coordination, dexterity, edema, ROM, strength, pain, fascial restrictions, flexibility, Fine motor control, Gross motor control, endurance, decreased knowledge of precautions, decreased knowledge of use of DME, skin integrity, and UE functional use,  , and psychosocial skills including coping strategies, environmental adaptation, habits, interpersonal interactions, and routines and behaviors.   IMPAIRMENTS: are limiting patient from ADLs, IADLs, rest and sleep, education, work, play, leisure, and social participation.   COMORBIDITIES: may have co-morbidities  that affects occupational performance. Patient will benefit from skilled OT to address above impairments and improve overall function.  MODIFICATION OR ASSISTANCE TO COMPLETE EVALUATION: No modification of tasks or assist necessary to complete an evaluation.  OT OCCUPATIONAL PROFILE AND HISTORY: Detailed assessment: Review of records and additional review of physical, cognitive, psychosocial history related to current functional performance.  CLINICAL DECISION MAKING: LOW - limited treatment options, no task modification necessary  REHAB POTENTIAL: Good  EVALUATION COMPLEXITY: Low      PLAN:  OT FREQUENCY: 2x/week  OT DURATION: 8 weeksplus eval- anticipate d/c after 4-6 weeks  PLANNED INTERVENTIONS: self care/ADL training, therapeutic exercise, therapeutic activity, neuromuscular re-education, manual therapy, scar mobilization, manual lymph drainage, passive range of motion, balance training, splinting, electrical stimulation, ultrasound, paraffin, moist heat, cryotherapy, contrast bath, patient/family education, energy conservation, coping strategies training, DME and/or AE instructions, and Re-evaluation  RECOMMENDED OTHER SERVICES: n/a  CONSULTED AND AGREED WITH PLAN OF CARE: Patient  PLAN FOR NEXT SESSION: progress HEP, [place], scar  massage   Angela Vazguez, OT 04/23/2023, 2:04 PM

## 2023-04-28 ENCOUNTER — Ambulatory Visit: Payer: 59 | Admitting: Occupational Therapy

## 2023-04-28 ENCOUNTER — Encounter: Payer: Self-pay | Admitting: Occupational Therapy

## 2023-04-28 DIAGNOSIS — M25642 Stiffness of left hand, not elsewhere classified: Secondary | ICD-10-CM

## 2023-04-28 DIAGNOSIS — M79642 Pain in left hand: Secondary | ICD-10-CM

## 2023-04-28 DIAGNOSIS — M6281 Muscle weakness (generalized): Secondary | ICD-10-CM

## 2023-04-28 NOTE — Patient Instructions (Signed)
Contrast Bath  Prepare the baths.  Cold = 55-65 degrees     Hot = 105-110 degrees  Starting with the hot, dip hand or foot all the way into the water and hold there for selected duration.  Preferably 3 minutes.  After selected duration is up, dip hand or foot into the cold for 1/3 duration of the hot. (3 minutes hot, 1 minute cold)  Alternate back and forth for the times indicated for no more than a total of 20 minutes ending with hot.   

## 2023-04-28 NOTE — Therapy (Signed)
OUTPATIENT OCCUPATIONAL THERAPY ORTHO treatment  Patient Name: Alicia Booth MRN: [id number] DOB:04-30-1971, 52 y.o., female Today's Date: 04/28/2023  PCP: Dr. Elease Etienne REFERRING PROVIDER: Jari Sportsman PA-C, Dr. Roda Shutters  END OF SESSION:  OT End of Session - 04/28/23 1407     Visit Number 5    Number of Visits 17    Date for OT Re-Evaluation 06/10/23    Authorization Type aetna    OT Start Time 1404    OT Stop Time 1445    OT Time Calculation (min) 41 min    Activity Tolerance Patient tolerated treatment well;Patient limited by pain    Behavior During Therapy WFL for tasks assessed/performed                Past Medical History:  Diagnosis Date   Diabetes (HCC)    High cholesterol    Hypertension    Stress-induced cardiomyopathy    Past Surgical History:  Procedure Laterality Date   BARIATRIC SURGERY  2014   BREAST BIOPSY Left    lymph node bx benign   HEEL SPUR EXCISION     LEFT HEART CATH AND CORONARY ANGIOGRAPHY N/A 04/11/2022   Procedure: LEFT HEART CATH AND CORONARY ANGIOGRAPHY;  Surgeon: Corky Crafts, MD;  Location: [facility];  Service: Cardiovascular;  Laterality: N/A;   MENISCUS REPAIR     tummy tuck     Patient Active Problem List   Diagnosis Date Noted   Chronic diastolic congestive heart failure (HCC) 01/24/2023   Chronic vaginitis 10/24/2022   Hyperlipidemia associated with type 2 diabetes mellitus (HCC) 10/24/2022   Chronic tension-type headache, not intractable 05/23/2022   Trigger finger, left little finger 05/23/2022   Takotsubo cardiomyopathy 04/24/2022   S/P gastric sleeve procedure 04/24/2022   Adjustment disorder with mixed anxiety and depressed mood 04/24/2022   Perimenopausal symptom 04/24/2022   NSTEMI (non-ST elevated myocardial infarction) (HCC) 04/11/2022   Myopia with astigmatism and presbyopia, bilateral 03/19/2018   Obstructive sleep apnea (adult) (pediatric) 10/13/2012   Diabetes (HCC) 11/16/2009   Hypertension  11/16/2009    ONSET DATE: 03/13/23  REFERRING DIAG:  G56.02 (ICD-10-CM) - Left carpal tunnel syndrome  M65.342 (ICD-10-CM) - Trigger ring finger of left hand    THERAPY DIAG:  Pain in left hand  Stiffness of left hand, not elsewhere classified  Muscle weakness (generalized)  Rationale for Evaluation and Treatment: Rehabilitation  SUBJECTIVE:   SUBJECTIVE STATEMENT: Pt reports her hand is sore from using it this weekend Pt accompanied by: self  PERTINENT HISTORY: status post left carpal tunnel release and left ring finger trigger finger release, date of surgery 03/13/2023. PMH HTN, DM, CHF  PRECAUTIONS: Other: no heavy lifting or submerging hand until 4 weeks post op  WEIGHT BEARING RESTRICTIONS: Yes no heavy lifting   PAIN:  Are you having pain? Yes: NPRS scale: 2/10 Pain location: hand, wrist  Pain description: aching Aggravating factors: straightening finger Relieving factors: meds  FALLS: Has patient fallen in last 6 months? No  LIVING ENVIRONMENT: Lives with: lives alone   PLOF: Independent  PATIENT GOALS: improve ROM and pain  NEXT MD VISIT: JUNE 8  OBJECTIVE:   HAND DOMINANCE: Right  ADLs: Overall ADLs: mod I with dominant RUE Transfers/ambulation related to ADLs:independent   FUNCTIONAL OUTCOME MEASURES: Quick Dash: 52.5%  UPPER EXTREMITY ROM:   LUE grossly 85 % composite finger flexion, 90% extension, limited for ring finger, pt opposes all digits  (Blank rows = not tested)  Active ROM Right  eval Left eval  Thumb MCP (0-60)    Thumb IP (0-80)    Thumb Radial abd/add (0-55)     Thumb Palmar abd/add (0-45)     Thumb Opposition to Small Finger     Index MCP (0-90)     Index PIP (0-100)     Index DIP (0-70)      Long MCP (0-90)      Long PIP (0-100)      Long DIP (0-70)      Ring MCP (0-90) 80     Ring PIP (0-100)  73flex/ -25 ext    Ring DIP (0-70)      Little MCP (0-90)      Little PIP (0-100)      Little DIP (0-70)      (Blank  rows = not tested) 85% composite finger flexion Opposes all digits HAND FUNCTION: Grip strength: Right: NT lbs; Left: NT lbs- grip not tested due to pain and precautions  COORDINATION: Impaired due to pain and stiffness  SENSATION: WFL  EDEMA: moderate edema in L palm incision sites, Pt with 2 small open stiich sites at lower palm incision site. Top incision site at base of ring finger is fully closed  COGNITION: Overall cognitive status: Within functional limits for tasks assessed   OBSERVATIONS: Pt demonstrates significant pain with ROM of ring finger   TODAY'S TREATMENT:                                                                                                                              DATE: 04/28/23-US , 0.8w/cm 2, 20% to ring finger palm and incision sites as  incision sites are closed. Paraffin x 8 mins to left hand for pain and stiffness, no adverse reactions Tendon gliding exercises, PIP  and DIP blocking exercises,active wrist flexion/ extension, passive wrist extension- prayer position, and passive wrist flexion, reverse blocking min v.c Pen rolling exercise, twirling pen between fingers and rotating stress balls in L palm for increased LUE functional use, min difficulty/ v.c   04/23/23 [place] , 0.8w/cm 2, 20% to ring finger palm and incision sites as  incision sites are closed. Paraffin x 8 mins to left hand for pain and stiffness. Tendon gliding exercises, PIP blocking exercises,active wrist flexion/ extension, passive wrist extension- prayer position, and passive wrist flexion, reverse blocking min v.c and demonstration  Flipping playing cards, flicking playing cards, dealing playing cards for improved functional use of LUE  04/21/23- Pt with continues edema at palmar incision sites [place] , 0.8w/cm 2, 20% to ring palm and incision sites as  incision sites are closed. Paraffin x 8 mins to left hand for pain and stiffness. Tendon gliding exercises, PIP  blocking exercises,, P/ROM to PIP in extension and active wrist flexion/ extension, passive wrist extension prayer position, min v.c and demonstration  Cupping to  palmar incision for scar mobilization  Gel pads applied under compression glove at incision sites which are  now closed      PATIENT EDUCATION: Education details: pen rolling, twirl pen, rotating stress balls, contrast bath- verbal instruction and handout Person educated: Patient Education method: Explanation, Demonstration, Verbal cues,handout Education comprehension: verbalized understanding, returned demonstration, and verbal cues required  HOME EXERCISE PROGRAM: 04/07/23-tendon gliding  GOALS: Goals reviewed with patient? Yes  SHORT TERM GOALS: Target date: 05/06/23  I with initial HEP  Goal status:met, 04/23/23  2.  I with scar massage, edema control techniques   Goal status: met, 04/23/23  3.  Pt will demonstrate extensor lag of no mor than -5 for left ring finger. Baseline: -25 Goal status: -5 met 04/22/14  4.  Pt will demonstrate full composite finger flexion for increased ease with ADLs. Baseline: 85% Goal status:  ongoing, 95% 04/23/23    LONG TERM GOALS: Target date: 06/10/23  I with updated HEP.   Goal status: INITIAL  2.  Pt will improve Quick Dash score to 45% or better. Baseline: 52.5% disability Goal status: INITIAL  3.  Pt will demonstrate grip strength of at least 25 lbs for increased functional use.  Goal status: INITIAL  4.  Pt will resume use of LUE as a non dominant assist for ADLs/IADLS with pain no greater than 2/10. Baseline: not currently using consistently, pain 4/10 Goal status: INITIAL    ASSESSMENT:  CLINICAL IMPRESSION: Pt is progressing towards goals. She has increased pain today after overuisng her LUE on the weekend. Pt felt better following [place] and paraffin.  PERFORMANCE DEFICITS: in functional skills including ADLs, IADLs, coordination, dexterity, edema, ROM,  strength, pain, fascial restrictions, flexibility, Fine motor control, Gross motor control, endurance, decreased knowledge of precautions, decreased knowledge of use of DME, skin integrity, and UE functional use,  , and psychosocial skills including coping strategies, environmental adaptation, habits, interpersonal interactions, and routines and behaviors.   IMPAIRMENTS: are limiting patient from ADLs, IADLs, rest and sleep, education, work, play, leisure, and social participation.   COMORBIDITIES: may have co-morbidities  that affects occupational performance. Patient will benefit from skilled OT to address above impairments and improve overall function.  MODIFICATION OR ASSISTANCE TO COMPLETE EVALUATION: No modification of tasks or assist necessary to complete an evaluation.  OT OCCUPATIONAL PROFILE AND HISTORY: Detailed assessment: Review of records and additional review of physical, cognitive, psychosocial history related to current functional performance.  CLINICAL DECISION MAKING: LOW - limited treatment options, no task modification necessary  REHAB POTENTIAL: Good  EVALUATION COMPLEXITY: Low      PLAN:  OT FREQUENCY: 2x/week  OT DURATION: 8 weeksplus eval- anticipate d/c after 4-6 weeks  PLANNED INTERVENTIONS: self care/ADL training, therapeutic exercise, therapeutic activity, neuromuscular re-education, manual therapy, scar mobilization, manual lymph drainage, passive range of motion, balance training, splinting, electrical stimulation, ultrasound, paraffin, moist heat, cryotherapy, contrast bath, patient/family education, energy conservation, coping strategies training, DME and/or AE instructions, and Re-evaluation  RECOMMENDED OTHER SERVICES: n/a  CONSULTED AND AGREED WITH PLAN OF CARE: Patient  PLAN FOR NEXT SESSION: progress HEP, [place], scar massage   Marlon Vonruden, OT 04/28/2023, 2:09 PM

## 2023-04-30 ENCOUNTER — Ambulatory Visit: Payer: 59 | Admitting: Occupational Therapy

## 2023-04-30 DIAGNOSIS — M25642 Stiffness of left hand, not elsewhere classified: Secondary | ICD-10-CM

## 2023-04-30 DIAGNOSIS — M6281 Muscle weakness (generalized): Secondary | ICD-10-CM

## 2023-04-30 DIAGNOSIS — M79642 Pain in left hand: Secondary | ICD-10-CM | POA: Diagnosis not present

## 2023-04-30 NOTE — Therapy (Signed)
OUTPATIENT OCCUPATIONAL THERAPY ORTHO treatment  Patient Name: Alicia Booth MRN: [id number] DOB:1971/09/28, 52 y.o., female Today's Date: 04/30/2023  PCP: Dr. Elease Etienne REFERRING PROVIDER: Jari Sportsman PA-C, Dr. Roda Shutters  END OF SESSION:  OT End of Session - 04/30/23 1410     Visit Number 6    Number of Visits 17    Date for OT Re-Evaluation 06/10/23    Authorization Type aetna    Authorization - Visit Number 6    Authorization - Number of Visits 60    OT Start Time 1401    OT Stop Time 1445    OT Time Calculation (min) 44 min    Activity Tolerance Patient tolerated treatment well;Patient limited by pain    Behavior During Therapy WFL for tasks assessed/performed                Past Medical History:  Diagnosis Date   Diabetes (HCC)    High cholesterol    Hypertension    Stress-induced cardiomyopathy    Past Surgical History:  Procedure Laterality Date   BARIATRIC SURGERY  2014   BREAST BIOPSY Left    lymph node bx benign   HEEL SPUR EXCISION     LEFT HEART CATH AND CORONARY ANGIOGRAPHY N/A 04/11/2022   Procedure: LEFT HEART CATH AND CORONARY ANGIOGRAPHY;  Surgeon: Corky Crafts, MD;  Location: [facility];  Service: Cardiovascular;  Laterality: N/A;   MENISCUS REPAIR     tummy tuck     Patient Active Problem List   Diagnosis Date Noted   Chronic diastolic congestive heart failure (HCC) 01/24/2023   Chronic vaginitis 10/24/2022   Hyperlipidemia associated with type 2 diabetes mellitus (HCC) 10/24/2022   Chronic tension-type headache, not intractable 05/23/2022   Trigger finger, left little finger 05/23/2022   Takotsubo cardiomyopathy 04/24/2022   S/P gastric sleeve procedure 04/24/2022   Adjustment disorder with mixed anxiety and depressed mood 04/24/2022   Perimenopausal symptom 04/24/2022   NSTEMI (non-ST elevated myocardial infarction) (HCC) 04/11/2022   Myopia with astigmatism and presbyopia, bilateral 03/19/2018   Obstructive sleep apnea (adult)  (pediatric) 10/13/2012   Diabetes (HCC) 11/16/2009   Hypertension 11/16/2009    ONSET DATE: 03/13/23  REFERRING DIAG:  G56.02 (ICD-10-CM) - Left carpal tunnel syndrome  M65.342 (ICD-10-CM) - Trigger ring finger of left hand    THERAPY DIAG:  Pain in left hand  Stiffness of left hand, not elsewhere classified  Muscle weakness (generalized)  Rationale for Evaluation and Treatment: Rehabilitation  SUBJECTIVE:   SUBJECTIVE STATEMENT: Pt reports her hand is better than Monday Pt accompanied by: self  PERTINENT HISTORY: status post left carpal tunnel release and left ring finger trigger finger release, date of surgery 03/13/2023. PMH HTN, DM, CHF  PRECAUTIONS: Other: no heavy lifting or submerging hand until 4 weeks post op  WEIGHT BEARING RESTRICTIONS: Yes no heavy lifting   PAIN:  Are you having pain? Yes: NPRS scale: 1-2/10 Pain location: hand, wrist  Pain description: aching Aggravating factors: straightening finger Relieving factors: meds  FALLS: Has patient fallen in last 6 months? No  LIVING ENVIRONMENT: Lives with: lives alone   PLOF: Independent  PATIENT GOALS: improve ROM and pain  NEXT MD VISIT: JUNE 8  OBJECTIVE:   HAND DOMINANCE: Right  ADLs: Overall ADLs: mod I with dominant RUE Transfers/ambulation related to ADLs:independent   FUNCTIONAL OUTCOME MEASURES: Quick Dash: 52.5%  UPPER EXTREMITY ROM:   LUE grossly 85 % composite finger flexion, 90% extension, limited for ring finger,  pt opposes all digits  (Blank rows = not tested)  Active ROM Right eval Left eval  Thumb MCP (0-60)    Thumb IP (0-80)    Thumb Radial abd/add (0-55)     Thumb Palmar abd/add (0-45)     Thumb Opposition to Small Finger     Index MCP (0-90)     Index PIP (0-100)     Index DIP (0-70)      Long MCP (0-90)      Long PIP (0-100)      Long DIP (0-70)      Ring MCP (0-90) 80     Ring PIP (0-100)  23flex/ -25 ext    Ring DIP (0-70)      Little MCP (0-90)       Little PIP (0-100)      Little DIP (0-70)      (Blank rows = not tested) 85% composite finger flexion Opposes all digits HAND FUNCTION: Grip strength: Right: NT lbs; Left: NT lbs- grip not tested due to pain and precautions  COORDINATION: Impaired due to pain and stiffness  SENSATION: WFL  EDEMA: moderate edema in L palm incision sites, Pt with 2 small open stiich sites at lower palm incision site. Top incision site at base of ring finger is fully closed  COGNITION: Overall cognitive status: Within functional limits for tasks assessed   OBSERVATIONS: Pt demonstrates significant pain with ROM of ring finger   TODAY'S TREATMENT:                                                                                                                              DATE: 04/30/23- Pt with mild edema present in palm at incision sites and wrist. Paraffin x 10 mins to left hand for pain and stiffness, no adverse reactions [place] , 0.8w/cm 2, 20% to ring finger palm and incision sites as  incision sites are closed.  Tendon gliding exercises, active wrist flexion/ extension, passive wrist extension- prayer position Edema improved after [place] and exercises. Pt was instructed in yellow theraputty HEP for grip and pinch, min v.c 10-20 reps each. Pt was instructed to discontinue if her pain or swelling increases.  04/28/23-US , 0.8w/cm 2, 20% to ring finger palm and incision sites as  incision sites are closed. Paraffin x 8 mins to left hand for pain and stiffness, no adverse reactions Tendon gliding exercises, PIP  and DIP blocking exercises,active wrist flexion/ extension, passive wrist extension- prayer position, and passive wrist flexion, reverse blocking min v.c Pen rolling exercise, twirling pen between fingers and rotating stress balls in L palm for increased LUE functional use, min difficulty/ v.c   04/23/23 [place] , 0.8w/cm 2, 20% to ring finger palm and incision sites as  incision sites are  closed. Paraffin x 8 mins to left hand for pain and stiffness. Tendon gliding exercises, PIP blocking exercises,active wrist flexion/ extension, passive wrist extension- prayer position, and passive wrist flexion,  reverse blocking min v.c and demonstration  Flipping playing cards, flicking playing cards, dealing playing cards for improved functional use of LUE  04/21/23- Pt with continues edema at palmar incision sites [place] , 0.8w/cm 2, 20% to ring palm and incision sites as  incision sites are closed. Paraffin x 8 mins to left hand for pain and stiffness. Tendon gliding exercises, PIP blocking exercises,, P/ROM to PIP in extension and active wrist flexion/ extension, passive wrist extension prayer position, min v.c and demonstration  Cupping to  palmar incision for scar mobilization  Gel pads applied under compression glove at incision sites which are now closed      PATIENT EDUCATION: Education details: yellow putty exercises Person educated: Patient Education method: Programmer, multimedia, Demonstration, Verbal cues,handout Education comprehension: verbalized understanding, returned demonstration, and verbal cues required  HOME EXERCISE PROGRAM: 04/07/23-tendon gliding Pt has blocking exercises, A/ROM and P/ROM exercises  GOALS: Goals reviewed with patient? Yes  SHORT TERM GOALS: Target date: 05/06/23  I with initial HEP  Goal status:met, 04/23/23  2.  I with scar massage, edema control techniques   Goal status: met, 04/23/23  3.  Pt will demonstrate extensor lag of no mor than -5 for left ring finger. Baseline: -25 Goal status: -5 met 04/22/14  4.  Pt will demonstrate full composite finger flexion for increased ease with ADLs. Baseline: 85% Goal status:  ongoing 97% 04/30/23   LONG TERM GOALS: Target date: 06/10/23  I with updated HEP.   Goal status: INITIAL  2.  Pt will improve Quick Dash score to 45% or better. Baseline: 52.5% disability Goal status: INITIAL  3.  Pt  will demonstrate grip strength of at least 25 lbs for increased functional use.  Goal status: INITIAL  4.  Pt will resume use of LUE as a non dominant assist for ADLs/IADLS with pain no greater than 2/10. Baseline: not currently using consistently, pain 4/10 Goal status: INITIAL    ASSESSMENT:  CLINICAL IMPRESSION: Pt is progressing towards goals. Pt's pain is better today. She has less pain and edema at end of session. She demonstrates understanding understanding of yellow putty HEP.  PERFORMANCE DEFICITS: in functional skills including ADLs, IADLs, coordination, dexterity, edema, ROM, strength, pain, fascial restrictions, flexibility, Fine motor control, Gross motor control, endurance, decreased knowledge of precautions, decreased knowledge of use of DME, skin integrity, and UE functional use,  , and psychosocial skills including coping strategies, environmental adaptation, habits, interpersonal interactions, and routines and behaviors.   IMPAIRMENTS: are limiting patient from ADLs, IADLs, rest and sleep, education, work, play, leisure, and social participation.   COMORBIDITIES: may have co-morbidities  that affects occupational performance. Patient will benefit from skilled OT to address above impairments and improve overall function.  MODIFICATION OR ASSISTANCE TO COMPLETE EVALUATION: No modification of tasks or assist necessary to complete an evaluation.  OT OCCUPATIONAL PROFILE AND HISTORY: Detailed assessment: Review of records and additional review of physical, cognitive, psychosocial history related to current functional performance.  CLINICAL DECISION MAKING: LOW - limited treatment options, no task modification necessary  REHAB POTENTIAL: Good  EVALUATION COMPLEXITY: Low      PLAN:  OT FREQUENCY: 2x/week  OT DURATION: 8 weeksplus eval- anticipate d/c after 4-6 weeks  PLANNED INTERVENTIONS: self care/ADL training, therapeutic exercise, therapeutic activity,  neuromuscular re-education, manual therapy, scar mobilization, manual lymph drainage, passive range of motion, balance training, splinting, electrical stimulation, ultrasound, paraffin, moist heat, cryotherapy, contrast bath, patient/family education, energy conservation, coping strategies training, DME and/or AE instructions, and Re-evaluation  RECOMMENDED OTHER SERVICES: n/a  CONSULTED AND AGREED WITH PLAN OF CARE: Patient  PLAN FOR NEXT SESSION: Paraffin, [place], continue A/ROM, blocking exercises, review putty,   scar massage   Rashay Barnette, OT 04/30/2023, 2:35 PM

## 2023-04-30 NOTE — Patient Instructions (Signed)
1. Grip Strengthening (Resistive Putty)   Squeeze putty using thumb and all fingers. Repeat _10-20___ times. Do __1_ sessions per day.   2. Roll putty into tube on table and pinch between each finger and thumb, can do ring and small x 10 reps. Do 1 sessions per day     Copyright  VHI. All rights reserved.

## 2023-05-06 NOTE — Therapy (Signed)
OUTPATIENT OCCUPATIONAL THERAPY ORTHO treatment  Patient Name: Alicia Booth MRN: [id number] DOB:Apr 03, 1971, 52 y.o., female Today's Date: 05/07/2023  PCP: Dr. Elease Etienne REFERRING PROVIDER: Jari Sportsman PA-C, Dr. Roda Shutters  END OF SESSION:  OT End of Session - 05/07/23 0853     Visit Number 7    Number of Visits 17    Date for OT Re-Evaluation 06/10/23    Authorization Type aetna    Authorization - Number of Visits 60    OT Start Time 0848    OT Stop Time 0937    OT Time Calculation (min) 49 min    Activity Tolerance Patient tolerated treatment well;Patient limited by pain    Behavior During Therapy [facility] for tasks assessed/performed                 Past Medical History:  Diagnosis Date   Diabetes (HCC)    High cholesterol    Hypertension    Stress-induced cardiomyopathy    Past Surgical History:  Procedure Laterality Date   BARIATRIC SURGERY  2014   BREAST BIOPSY Left    lymph node bx benign   HEEL SPUR EXCISION     LEFT HEART CATH AND CORONARY ANGIOGRAPHY N/A 04/11/2022   Procedure: LEFT HEART CATH AND CORONARY ANGIOGRAPHY;  Surgeon: Corky Crafts, MD;  Location: [facility];  Service: Cardiovascular;  Laterality: N/A;   MENISCUS REPAIR     tummy tuck     Patient Active Problem List   Diagnosis Date Noted   Chronic diastolic congestive heart failure (HCC) 01/24/2023   Chronic vaginitis 10/24/2022   Hyperlipidemia associated with type 2 diabetes mellitus (HCC) 10/24/2022   Chronic tension-type headache, not intractable 05/23/2022   Trigger finger, left little finger 05/23/2022   Takotsubo cardiomyopathy 04/24/2022   S/P gastric sleeve procedure 04/24/2022   Adjustment disorder with mixed anxiety and depressed mood 04/24/2022   Perimenopausal symptom 04/24/2022   NSTEMI (non-ST elevated myocardial infarction) (HCC) 04/11/2022   Myopia with astigmatism and presbyopia, bilateral 03/19/2018   Obstructive sleep apnea (adult) (pediatric) 10/13/2012   Diabetes  (HCC) 11/16/2009   Hypertension 11/16/2009    ONSET DATE: 03/13/23  REFERRING DIAG:  G56.02 (ICD-10-CM) - Left carpal tunnel syndrome  M65.342 (ICD-10-CM) - Trigger ring finger of left hand    THERAPY DIAG:  Pain in left hand  Stiffness of left hand, not elsewhere classified  Muscle weakness (generalized)  Rationale for Evaluation and Treatment: Rehabilitation  SUBJECTIVE:   SUBJECTIVE STATEMENT: My wrist has been swelling more and pain I guess as nerve heals?  Pt reports 4th finger flex at "98%" Pt accompanied by: self  PERTINENT HISTORY: status post left carpal tunnel release and left ring finger trigger finger release, date of surgery 03/13/2023. PMH HTN, DM, CHF  PRECAUTIONS: Other: no heavy lifting or submerging hand until 4 weeks post op  WEIGHT BEARING RESTRICTIONS: Yes no heavy lifting   PAIN:  Are you having pain? Yes: NPRS scale: 2/10 Pain location: hand, wrist  Pain description: aching Aggravating factors: straightening finger Relieving factors: meds  FALLS: Has patient fallen in last 6 months? No  LIVING ENVIRONMENT: Lives with: lives alone   PLOF: Independent  PATIENT GOALS: improve ROM and pain  NEXT MD VISIT: JUNE 8  OBJECTIVE:   HAND DOMINANCE: Right  ADLs: Overall ADLs: mod I with dominant RUE Transfers/ambulation related to ADLs:independent   FUNCTIONAL OUTCOME MEASURES: Quick Dash: 52.5%  UPPER EXTREMITY ROM:   LUE grossly 85 % composite finger flexion,  90% extension, limited for ring finger, pt opposes all digits  (Blank rows = not tested)  Active ROM Right eval Left eval  Thumb MCP (0-60)    Thumb IP (0-80)    Thumb Radial abd/add (0-55)     Thumb Palmar abd/add (0-45)     Thumb Opposition to Small Finger     Index MCP (0-90)     Index PIP (0-100)     Index DIP (0-70)      Long MCP (0-90)      Long PIP (0-100)      Long DIP (0-70)      Ring MCP (0-90) 80     Ring PIP (0-100)  67flex/ -25 ext    Ring DIP (0-70)       Little MCP (0-90)      Little PIP (0-100)      Little DIP (0-70)      (Blank rows = not tested) 85% composite finger flexion Opposes all digits HAND FUNCTION: Grip strength: Right: NT lbs; Left: NT lbs- grip not tested due to pain and precautions  COORDINATION: Impaired due to pain and stiffness  SENSATION: WFL  EDEMA: moderate edema in L palm incision sites, Pt with 2 small open stiich sites at lower palm incision site. Top incision site at base of ring finger is fully closed  COGNITION: Overall cognitive status: Within functional limits for tasks assessed   OBSERVATIONS: Pt demonstrates significant pain with ROM of ring finger   TODAY'S TREATMENT:                                                                                                                                05/07/23-  Pt with mild edema present in palm at incision sites and wrist. Paraffin x 10 mins to left hand for pain and stiffness, no adverse reactions Ultrasound x10 min , 0.8w/cm 2, 20% min to ring finger palm and incision sites as  incision sites are closed with no adverse reactions for edema and scar tissue.  Tendon gliding exercises, thumb opposition/flex to base of 5th digit, IP flex/ext with MP blocked, passive wrist extension- prayer position and self PROM wrist flex. Yellow theraputty for grip and pinch, min v.c 10-20 reps each.  Edema improved after [place] and exercises.  Recommended that pt take 2 morning breaks and 2 afternoon breaks while working to perform tendon glides and wrist stretches and use ice pack at lunch and at end of work day due to edema and stiffness     04/30/23- Pt with mild edema present in palm at incision sites and wrist. Paraffin x 10 mins to left hand for pain and stiffness, no adverse reactions [place] , 0.8w/cm 2, 20% to ring finger palm and incision sites as  incision sites are closed.  Tendon gliding exercises, active wrist flexion/ extension, passive wrist extension-  prayer position Edema improved after [place] and exercises. Pt was instructed in yellow theraputty  HEP for grip and pinch, min v.c 10-20 reps each. Pt was instructed to discontinue if her pain or swelling increases.  04/28/23-US , 0.8w/cm 2, 20% to ring finger palm and incision sites as  incision sites are closed. Paraffin x 8 mins to left hand for pain and stiffness, no adverse reactions Tendon gliding exercises, PIP  and DIP blocking exercises,active wrist flexion/ extension, passive wrist extension- prayer position, and passive wrist flexion, reverse blocking min v.c Pen rolling exercise, twirling pen between fingers and rotating stress balls in L palm for increased LUE functional use, min difficulty/ v.c   04/23/23 [place] , 0.8w/cm 2, 20% to ring finger palm and incision sites as  incision sites are closed. Paraffin x 8 mins to left hand for pain and stiffness. Tendon gliding exercises, PIP blocking exercises,active wrist flexion/ extension, passive wrist extension- prayer position, and passive wrist flexion, reverse blocking min v.c and demonstration  Flipping playing cards, flicking playing cards, dealing playing cards for improved functional use of LUE  04/21/23- Pt with continues edema at palmar incision sites [place] , 0.8w/cm 2, 20% to ring palm and incision sites as  incision sites are closed. Paraffin x 8 mins to left hand for pain and stiffness. Tendon gliding exercises, PIP blocking exercises,, P/ROM to PIP in extension and active wrist flexion/ extension, passive wrist extension prayer position, min v.c and demonstration  Cupping to  palmar incision for scar mobilization  Gel pads applied under compression glove at incision sites which are now closed      PATIENT EDUCATION: Education details: reviewed yellow putty exercises.  Recommended that pt take 2 morning breaks and 2 afternoon breaks while working to perform tendon glides and wrist stretches and use ice pack at lunch and at  end of work day due to edema and stiffness  Person educated: Patient Education method: Programmer, multimedia, Facilities manager, Verbal cues Education comprehension: verbalized understanding and returned demonstration  HOME EXERCISE PROGRAM: 04/07/23-tendon gliding Pt has blocking exercises, A/ROM and P/ROM exercises  GOALS: Goals reviewed with patient? Yes  SHORT TERM GOALS: Target date: 05/06/23  I with initial HEP  Goal status:met, 04/23/23  2.  I with scar massage, edema control techniques   Goal status: met, 04/23/23  3.  Pt will demonstrate extensor lag of no mor than -5 for left ring finger. Baseline: -25 Goal status: -5 met 04/22/14  4.  Pt will demonstrate full composite finger flexion for increased ease with ADLs. Baseline: 85% Goal status:  ongoing 97% 04/30/23   LONG TERM GOALS: Target date: 06/10/23  I with updated HEP.   Goal status: INITIAL  2.  Pt will improve Quick Dash score to 45% or better. Baseline: 52.5% disability Goal status: INITIAL  3.  Pt will demonstrate grip strength of at least 25 lbs for increased functional use.  Goal status: INITIAL  4.  Pt will resume use of LUE as a non dominant assist for ADLs/IADLS with pain no greater than 2/10. Baseline: not currently using consistently, pain 4/10 Goal status: INITIAL    ASSESSMENT:  CLINICAL IMPRESSION: Pt is progressing towards goals. She has less pain and edema at end of session and reports slowly improving ROM, but 4th digit stiffness worsens throughout the day.  PERFORMANCE DEFICITS: in functional skills including ADLs, IADLs, coordination, dexterity, edema, ROM, strength, pain, fascial restrictions, flexibility, Fine motor control, Gross motor control, endurance, decreased knowledge of precautions, decreased knowledge of use of DME, skin integrity, and UE functional use,  , and psychosocial skills including  coping strategies, environmental adaptation, habits, interpersonal interactions, and routines and  behaviors.   IMPAIRMENTS: are limiting patient from ADLs, IADLs, rest and sleep, education, work, play, leisure, and social participation.   COMORBIDITIES: may have co-morbidities  that affects occupational performance. Patient will benefit from skilled OT to address above impairments and improve overall function.  MODIFICATION OR ASSISTANCE TO COMPLETE EVALUATION: No modification of tasks or assist necessary to complete an evaluation.  OT OCCUPATIONAL PROFILE AND HISTORY: Detailed assessment: Review of records and additional review of physical, cognitive, psychosocial history related to current functional performance.  CLINICAL DECISION MAKING: LOW - limited treatment options, no task modification necessary  REHAB POTENTIAL: Good  EVALUATION COMPLEXITY: Low      PLAN:  OT FREQUENCY: 2x/week  OT DURATION: 8 weeksplus eval- anticipate d/c after 4-6 weeks  PLANNED INTERVENTIONS: self care/ADL training, therapeutic exercise, therapeutic activity, neuromuscular re-education, manual therapy, scar mobilization, manual lymph drainage, passive range of motion, balance training, splinting, electrical stimulation, ultrasound, paraffin, moist heat, cryotherapy, contrast bath, patient/family education, energy conservation, coping strategies training, DME and/or AE instructions, and Re-evaluation  RECOMMENDED OTHER SERVICES: n/a  CONSULTED AND AGREED WITH PLAN OF CARE: Patient  PLAN FOR NEXT SESSION:  Paraffin, [place], continue A/ROM, blocking exercises, scar massage, continue with light strengthening   Kacee Koren, OTR/L 05/07/2023, 10:00 AM

## 2023-05-07 ENCOUNTER — Encounter: Payer: Self-pay | Admitting: Occupational Therapy

## 2023-05-07 ENCOUNTER — Ambulatory Visit: Payer: 59 | Admitting: Occupational Therapy

## 2023-05-07 DIAGNOSIS — M6281 Muscle weakness (generalized): Secondary | ICD-10-CM

## 2023-05-07 DIAGNOSIS — M79642 Pain in left hand: Secondary | ICD-10-CM

## 2023-05-07 DIAGNOSIS — M25642 Stiffness of left hand, not elsewhere classified: Secondary | ICD-10-CM

## 2023-05-12 ENCOUNTER — Ambulatory Visit: Payer: 59 | Attending: Nurse Practitioner | Admitting: Occupational Therapy

## 2023-05-12 DIAGNOSIS — M25642 Stiffness of left hand, not elsewhere classified: Secondary | ICD-10-CM

## 2023-05-12 DIAGNOSIS — M79642 Pain in left hand: Secondary | ICD-10-CM | POA: Diagnosis present

## 2023-05-12 DIAGNOSIS — M6281 Muscle weakness (generalized): Secondary | ICD-10-CM

## 2023-05-12 IMAGING — MG MM DIGITAL SCREENING BILAT W/ TOMO AND CAD
8 series · 8 of 24 positions shown · non-contrast
Comparison: Previous exam(s).

CLINICAL DATA: Screening.

EXAM:
DIGITAL SCREENING BILATERAL MAMMOGRAM WITH TOMOSYNTHESIS AND CAD
TECHNIQUE: Bilateral screening digital craniocaudal and mediolateral oblique
mammograms were obtained. Bilateral screening digital breast
tomosynthesis was performed. The images were evaluated with
computer-aided detection.

[L CC synth-2D]
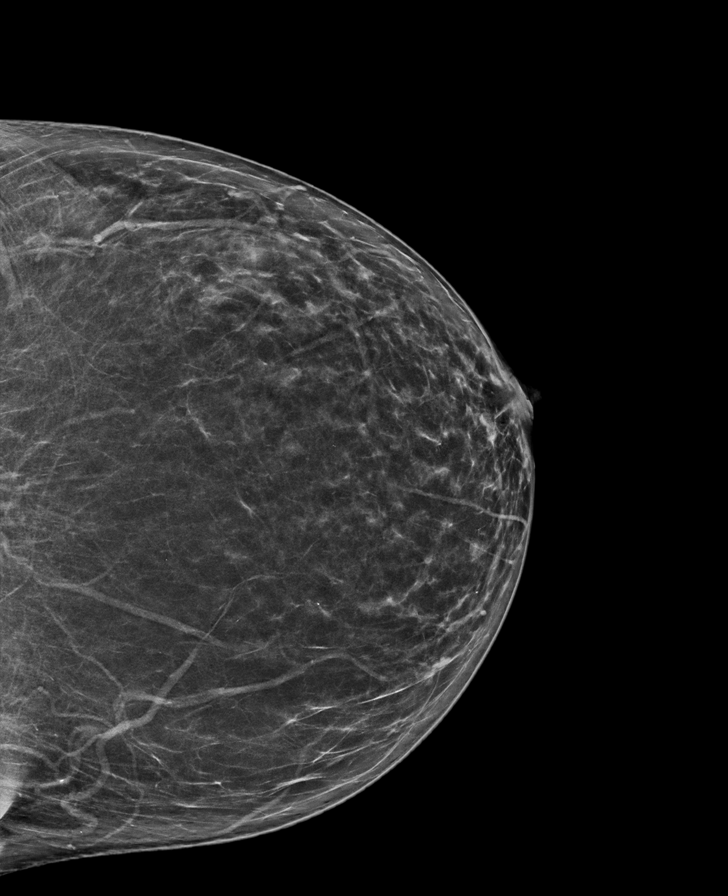

[R MLO synth-2D]
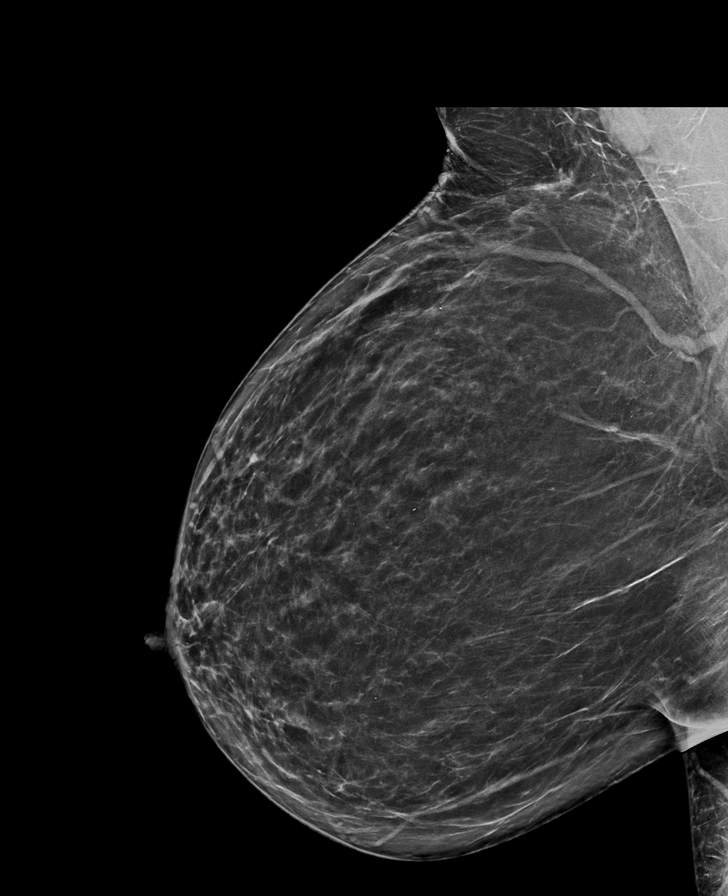

[L MLO synth-2D]
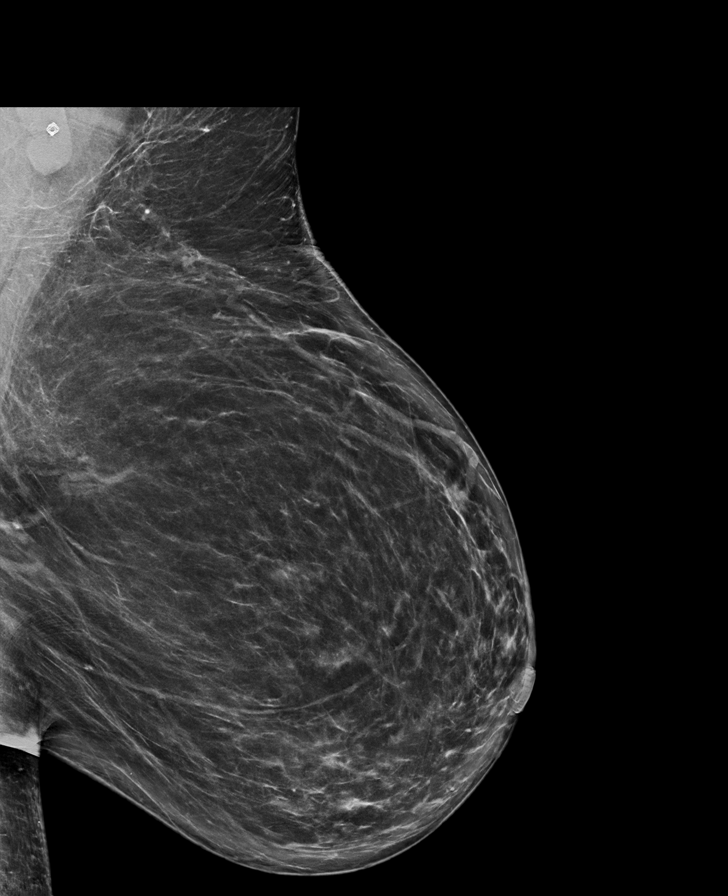

[R CC synth-2D]
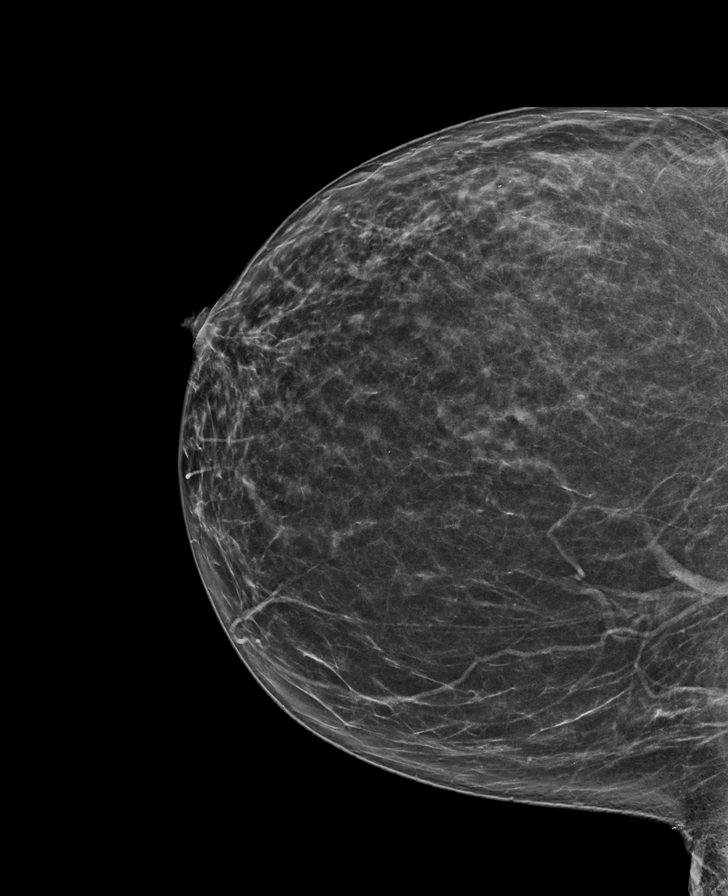

[R MLO tomo · tomo slice 41/82.0]
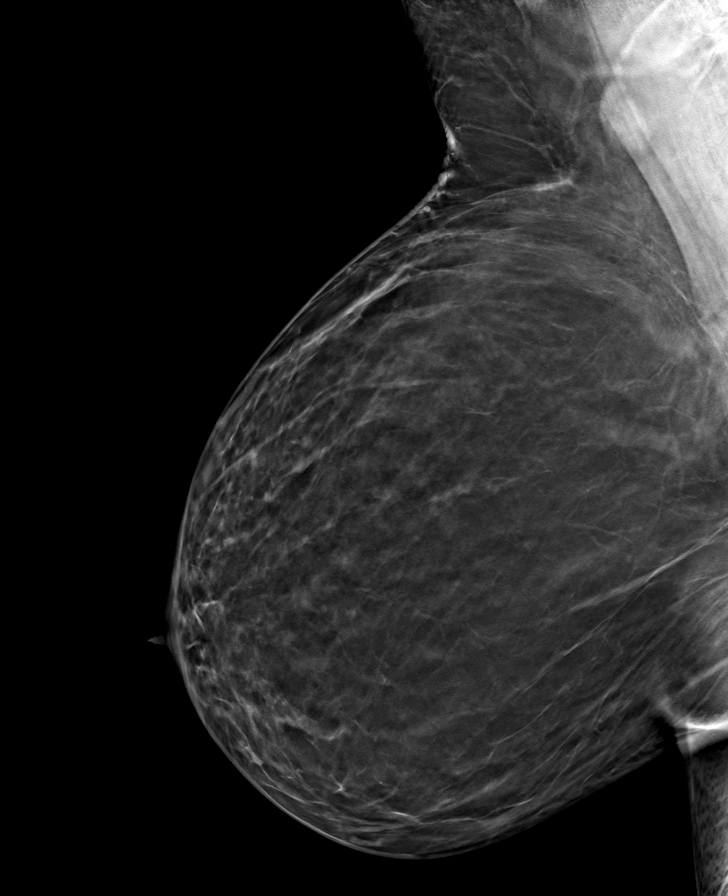

[L MLO tomo · tomo slice 45/90.0]
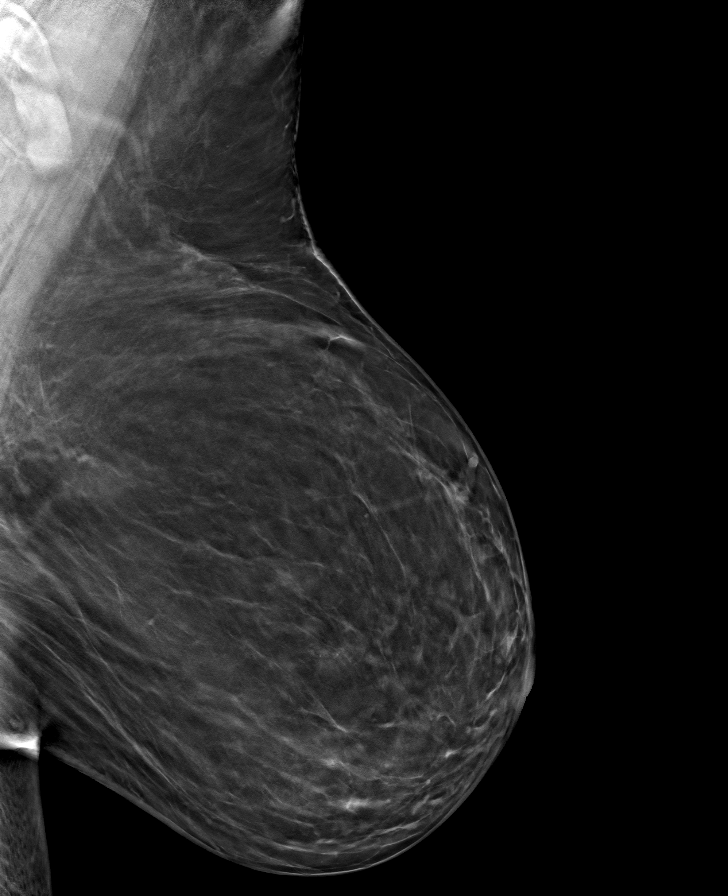

[R CC tomo · tomo slice 35/70.0]
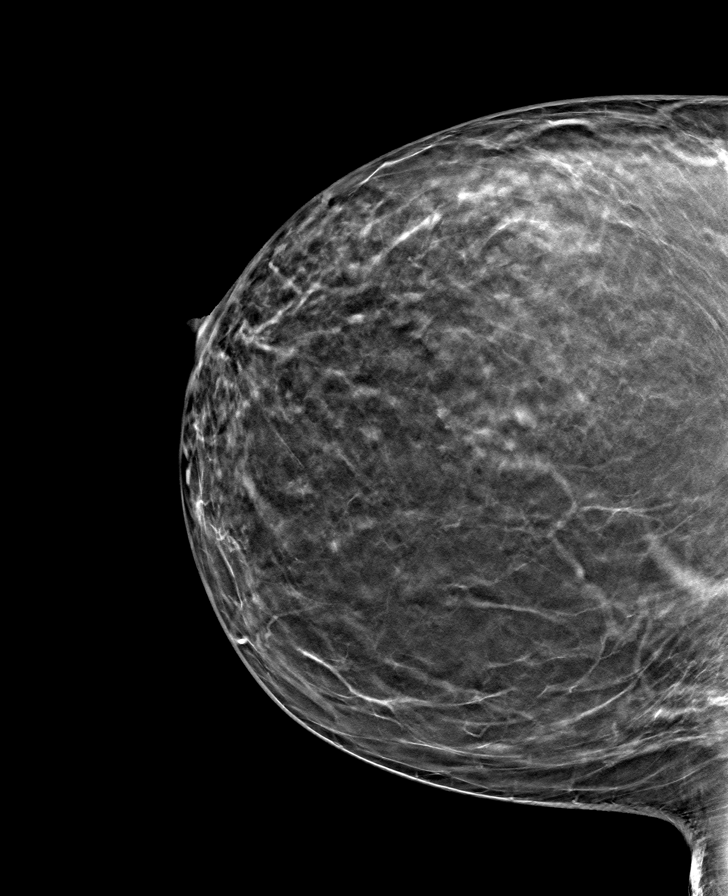

[L CC tomo · tomo slice 37/72.0]
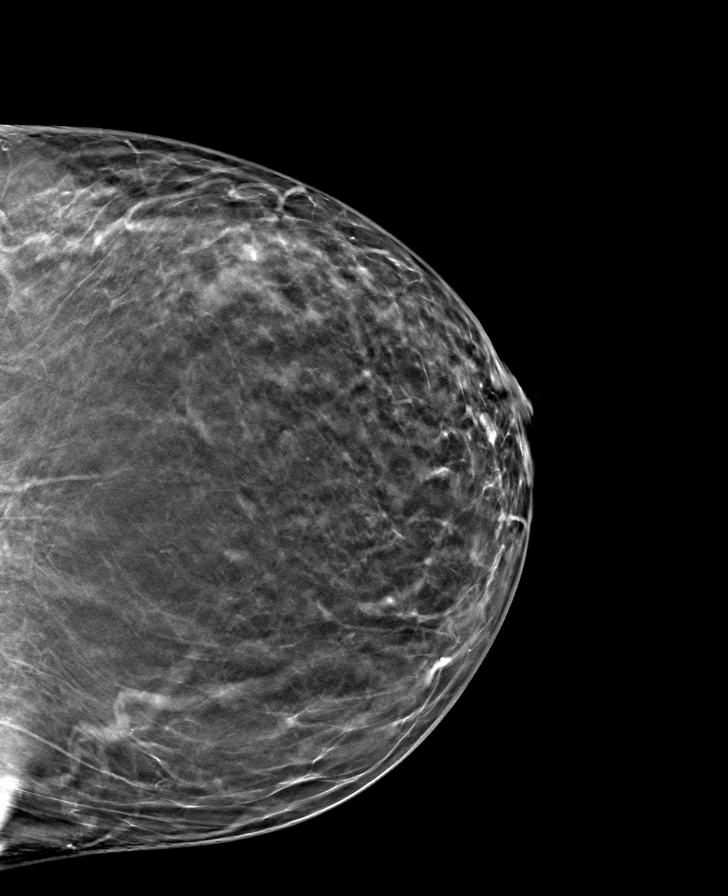

[8 of 24 positions shown; findings below may reference images not displayed]

ACR Breast Density Category b: There are scattered areas of
fibroglandular density.
FINDINGS: There are no findings suspicious for malignancy.
IMPRESSION: No mammographic evidence of malignancy. A result letter of this
screening mammogram will be mailed directly to the patient.

RECOMMENDATION:
Screening mammogram in one year. (Code:51-O-LD2)

BI-RADS CATEGORY  1: Negative.

## 2023-05-12 NOTE — Therapy (Signed)
OUTPATIENT OCCUPATIONAL THERAPY ORTHO treatment  Patient Name: FALAK FRITSCH MRN: [id number] DOB:Dec 27, 1970, 52 y.o., female Today's Date: 05/12/2023  PCP: Dr. Elease Etienne REFERRING PROVIDER: Jari Sportsman PA-C, Dr. Roda Shutters  END OF SESSION:  OT End of Session - 05/12/23 1538     Visit Number 8    Number of Visits 17    Date for OT Re-Evaluation 06/10/23    Authorization Type aetna    Authorization - Visit Number 8    Authorization - Number of Visits 60    OT Start Time 1535    OT Stop Time 1615    OT Time Calculation (min) 40 min    Activity Tolerance Patient tolerated treatment well;Patient limited by pain    Behavior During Therapy WFL for tasks assessed/performed                  Past Medical History:  Diagnosis Date   Diabetes (HCC)    High cholesterol    Hypertension    Stress-induced cardiomyopathy    Past Surgical History:  Procedure Laterality Date   BARIATRIC SURGERY  2014   BREAST BIOPSY Left    lymph node bx benign   HEEL SPUR EXCISION     LEFT HEART CATH AND CORONARY ANGIOGRAPHY N/A 04/11/2022   Procedure: LEFT HEART CATH AND CORONARY ANGIOGRAPHY;  Surgeon: Corky Crafts, MD;  Location: [facility];  Service: Cardiovascular;  Laterality: N/A;   MENISCUS REPAIR     tummy tuck     Patient Active Problem List   Diagnosis Date Noted   Chronic diastolic congestive heart failure (HCC) 01/24/2023   Chronic vaginitis 10/24/2022   Hyperlipidemia associated with type 2 diabetes mellitus (HCC) 10/24/2022   Chronic tension-type headache, not intractable 05/23/2022   Trigger finger, left little finger 05/23/2022   Takotsubo cardiomyopathy 04/24/2022   S/P gastric sleeve procedure 04/24/2022   Adjustment disorder with mixed anxiety and depressed mood 04/24/2022   Perimenopausal symptom 04/24/2022   NSTEMI (non-ST elevated myocardial infarction) (HCC) 04/11/2022   Myopia with astigmatism and presbyopia, bilateral 03/19/2018   Obstructive sleep apnea  (adult) (pediatric) 10/13/2012   Diabetes (HCC) 11/16/2009   Hypertension 11/16/2009    ONSET DATE: 03/13/23  REFERRING DIAG:  G56.02 (ICD-10-CM) - Left carpal tunnel syndrome  M65.342 (ICD-10-CM) - Trigger ring finger of left hand    THERAPY DIAG:  Pain in left hand  Stiffness of left hand, not elsewhere classified  Muscle weakness (generalized)  Rationale for Evaluation and Treatment: Rehabilitation  SUBJECTIVE:   SUBJECTIVE STATEMENT: Pt reports pain is 2/10, she has been exercising Pt accompanied by: self  PERTINENT HISTORY: status post left carpal tunnel release and left ring finger trigger finger release, date of surgery 03/13/2023. PMH HTN, DM, CHF  PRECAUTIONS: Other: no heavy lifting or submerging hand until 4 weeks post op  WEIGHT BEARING RESTRICTIONS: Yes no heavy lifting   PAIN:  Are you having pain? Yes: NPRS scale: 2/10 Pain location: hand, wrist  Pain description: aching Aggravating factors: straightening finger Relieving factors: meds  FALLS: Has patient fallen in last 6 months? No  LIVING ENVIRONMENT: Lives with: lives alone   PLOF: Independent  PATIENT GOALS: improve ROM and pain  NEXT MD VISIT: JUNE 8  OBJECTIVE:   HAND DOMINANCE: Right  ADLs: Overall ADLs: mod I with dominant RUE Transfers/ambulation related to ADLs:independent   FUNCTIONAL OUTCOME MEASURES: Quick Dash: 52.5%  UPPER EXTREMITY ROM:   LUE grossly 85 % composite finger flexion, 90% extension, limited  for ring finger, pt opposes all digits  (Blank rows = not tested)  Active ROM Right eval Left eval  Thumb MCP (0-60)    Thumb IP (0-80)    Thumb Radial abd/add (0-55)     Thumb Palmar abd/add (0-45)     Thumb Opposition to Small Finger     Index MCP (0-90)     Index PIP (0-100)     Index DIP (0-70)      Long MCP (0-90)      Long PIP (0-100)      Long DIP (0-70)      Ring MCP (0-90) 80     Ring PIP (0-100)  72flex/ -25 ext    Ring DIP (0-70)      Little MCP  (0-90)      Little PIP (0-100)      Little DIP (0-70)      (Blank rows = not tested) 85% composite finger flexion Opposes all digits HAND FUNCTION: Grip strength: Right: NT lbs; Left: NT lbs- grip not tested due to pain and precautions  COORDINATION: Impaired due to pain and stiffness  SENSATION: WFL  EDEMA: moderate edema in L palm incision sites, Pt with 2 small open stiich sites at lower palm incision site. Top incision site at base of ring finger is fully closed  COGNITION: Overall cognitive status: Within functional limits for tasks assessed   OBSERVATIONS: Pt demonstrates significant pain with ROM of ring finger   TODAY'S TREATMENT:   05/12/23   Pt with mild edema present in palm at incision sites and wrist. Paraffin x 10 mins to left hand for pain and stiffness, no adverse reactions Ultrasound x10 min , 0.8w/cm 2, 20% min to ring finger palm and incision sites as  incision sites are closed with no adverse reactions for edema and scar tissue.  Tendon gliding exercises, thumb opposition/flex to base of 5th digit, IP flex/ext with MP blocked, passive wrist extension- prayer position and  Pt reports taking more rest breaks   05/07/23-  Pt with mild edema present in palm at incision sites and wrist. Paraffin x 10 mins to left hand for pain and stiffness, no adverse reactions Ultrasound x10 min , 0.8w/cm 2, 20% min to ring finger palm and incision sites as  incision sites are closed with no adverse reactions for edema and scar tissue.  Tendon gliding exercises, thumb opposition/flex to base of 5th digit, IP flex/ext with MP blocked, passive wrist extension- prayer position and self PROM wrist flex. Yellow theraputty for grip and pinch, min v.c 10-20 reps each.  Edema improved after [place] and exercises.  Recommended that pt take 2 morning breaks and 2 afternoon breaks while working to perform tendon glides and wrist stretches and use ice pack at lunch and at end of work day due  to edema and stiffness     04/30/23- Pt with mild edema present in palm at incision sites and wrist. Paraffin x 10 mins to left hand for pain and stiffness, no adverse reactions [place] , 0.8w/cm 2, 20% to ring finger palm and incision sites as  incision sites are closed.  Tendon gliding exercises, active wrist flexion/ extension, passive wrist extension- prayer position Edema improved after [place] and exercises. Pt was instructed in yellow theraputty HEP for grip and pinch, min v.c 10-20 reps each. Pt was instructed to discontinue if her pain or swelling increases.  04/28/23-US , 0.8w/cm 2, 20% to ring finger palm and incision sites as  incision sites  are closed. Paraffin x 8 mins to left hand for pain and stiffness, no adverse reactions Tendon gliding exercises, PIP  and DIP blocking exercises,active wrist flexion/ extension, passive wrist extension- prayer position, and passive wrist flexion, reverse blocking min v.c Pen rolling exercise, twirling pen between fingers and rotating stress balls in L palm for increased LUE functional use, min difficulty/ v.c   04/23/23 [place] , 0.8w/cm 2, 20% to ring finger palm and incision sites as  incision sites are closed. Paraffin x 8 mins to left hand for pain and stiffness. Tendon gliding exercises, PIP blocking exercises,active wrist flexion/ extension, passive wrist extension- prayer position, and passive wrist flexion, reverse blocking min v.c and demonstration  Flipping playing cards, flicking playing cards, dealing playing cards for improved functional use of LUE  04/21/23- Pt with continues edema at palmar incision sites [place] , 0.8w/cm 2, 20% to ring palm and incision sites as  incision sites are closed. Paraffin x 8 mins to left hand for pain and stiffness. Tendon gliding exercises, PIP blocking exercises,, P/ROM to PIP in extension and active wrist flexion/ extension, passive wrist extension prayer position, min v.c and demonstration  Cupping to   palmar incision for scar mobilization  Gel pads applied under compression glove at incision sites which are now closed      PATIENT EDUCATION: Education details: reviewed yellow putty exercises.  Recommended that pt take 2 morning breaks and 2 afternoon breaks while working to perform tendon glides and wrist stretches and use ice pack at lunch and at end of work day due to edema and stiffness  Person educated: Patient Education method: Programmer, multimedia, Facilities manager, Verbal cues Education comprehension: verbalized understanding and returned demonstration  HOME EXERCISE PROGRAM: 04/07/23-tendon gliding Pt has blocking exercises, A/ROM and P/ROM exercises  GOALS: Goals reviewed with patient? Yes  SHORT TERM GOALS: Target date: 05/06/23  I with initial HEP  Goal status:met, 04/23/23  2.  I with scar massage, edema control techniques   Goal status: met, 04/23/23  3.  Pt will demonstrate extensor lag of no mor than -5 for left ring finger. Baseline: -25 Goal status: -5 met 04/22/14  4.  Pt will demonstrate full composite finger flexion for increased ease with ADLs. Baseline: 85% Goal status:  ongoing 97% 04/30/23   LONG TERM GOALS: Target date: 06/10/23  I with updated HEP.   Goal status: INITIAL  2.  Pt will improve Quick Dash score to 45% or better. Baseline: 52.5% disability Goal status: INITIAL  3.  Pt will demonstrate grip strength of at least 25 lbs for increased functional use.  Goal status: INITIAL  4.  Pt will resume use of LUE as a non dominant assist for ADLs/IADLS with pain no greater than 2/10. Baseline: not currently using consistently, pain 4/10 Goal status: INITIAL    ASSESSMENT:  CLINICAL IMPRESSION: Pt is progressing towards goals. She has less pain and edema at end of session and reports slowly improving ROM, but 4th digit stiffness worsens throughout the day.  PERFORMANCE DEFICITS: in functional skills including ADLs, IADLs, coordination, dexterity,  edema, ROM, strength, pain, fascial restrictions, flexibility, Fine motor control, Gross motor control, endurance, decreased knowledge of precautions, decreased knowledge of use of DME, skin integrity, and UE functional use,  , and psychosocial skills including coping strategies, environmental adaptation, habits, interpersonal interactions, and routines and behaviors.   IMPAIRMENTS: are limiting patient from ADLs, IADLs, rest and sleep, education, work, play, leisure, and social participation.   COMORBIDITIES: may have co-morbidities  that affects occupational performance. Patient will benefit from skilled OT to address above impairments and improve overall function.  MODIFICATION OR ASSISTANCE TO COMPLETE EVALUATION: No modification of tasks or assist necessary to complete an evaluation.  OT OCCUPATIONAL PROFILE AND HISTORY: Detailed assessment: Review of records and additional review of physical, cognitive, psychosocial history related to current functional performance.  CLINICAL DECISION MAKING: LOW - limited treatment options, no task modification necessary  REHAB POTENTIAL: Good  EVALUATION COMPLEXITY: Low      PLAN:  OT FREQUENCY: 2x/week  OT DURATION: 8 weeksplus eval- anticipate d/c after 4-6 weeks  PLANNED INTERVENTIONS: self care/ADL training, therapeutic exercise, therapeutic activity, neuromuscular re-education, manual therapy, scar mobilization, manual lymph drainage, passive range of motion, balance training, splinting, electrical stimulation, ultrasound, paraffin, moist heat, cryotherapy, contrast bath, patient/family education, energy conservation, coping strategies training, DME and/or AE instructions, and Re-evaluation  RECOMMENDED OTHER SERVICES: n/a  CONSULTED AND AGREED WITH PLAN OF CARE: Patient  PLAN FOR NEXT SESSION:  Paraffin, [place], continue A/ROM, blocking exercises, scar massage, continue with light strengthening   Dionicia Cerritos, OTR/L 05/12/2023, 3:40 PM

## 2023-05-15 ENCOUNTER — Ambulatory Visit: Payer: 59 | Admitting: Occupational Therapy

## 2023-05-15 ENCOUNTER — Ambulatory Visit (INDEPENDENT_AMBULATORY_CARE_PROVIDER_SITE_OTHER): Payer: 59 | Admitting: Physician Assistant

## 2023-05-15 DIAGNOSIS — M79642 Pain in left hand: Secondary | ICD-10-CM | POA: Diagnosis not present

## 2023-05-15 DIAGNOSIS — Z9889 Other specified postprocedural states: Secondary | ICD-10-CM

## 2023-05-15 DIAGNOSIS — M6281 Muscle weakness (generalized): Secondary | ICD-10-CM

## 2023-05-15 DIAGNOSIS — M65342 Trigger finger, left ring finger: Secondary | ICD-10-CM

## 2023-05-15 DIAGNOSIS — M25642 Stiffness of left hand, not elsewhere classified: Secondary | ICD-10-CM

## 2023-05-15 NOTE — Therapy (Signed)
OUTPATIENT OCCUPATIONAL THERAPY ORTHO treatment  Patient Name: JANIRAH REAPE MRN: [id number] DOB:Dec 13, 1970, 52 y.o., female Today's Date: 05/15/2023  PCP: Dr. Elease Etienne REFERRING PROVIDER: Jari Sportsman PA-C, Dr. Roda Shutters  END OF SESSION:  OT End of Session - 05/15/23 1327     Visit Number 9    Number of Visits 17    Date for OT Re-Evaluation 06/10/23    Authorization Type aetna    Authorization - Visit Number 9    Authorization - Number of Visits 60    OT Start Time 1317    OT Stop Time 1400    OT Time Calculation (min) 43 min    Activity Tolerance Patient tolerated treatment well;Patient limited by pain    Behavior During Therapy WFL for tasks assessed/performed                   Past Medical History:  Diagnosis Date   Diabetes (HCC)    High cholesterol    Hypertension    Stress-induced cardiomyopathy    Past Surgical History:  Procedure Laterality Date   BARIATRIC SURGERY  2014   BREAST BIOPSY Left    lymph node bx benign   HEEL SPUR EXCISION     LEFT HEART CATH AND CORONARY ANGIOGRAPHY N/A 04/11/2022   Procedure: LEFT HEART CATH AND CORONARY ANGIOGRAPHY;  Surgeon: Corky Crafts, MD;  Location: [facility];  Service: Cardiovascular;  Laterality: N/A;   MENISCUS REPAIR     tummy tuck     Patient Active Problem List   Diagnosis Date Noted   Chronic diastolic congestive heart failure (HCC) 01/24/2023   Chronic vaginitis 10/24/2022   Hyperlipidemia associated with type 2 diabetes mellitus (HCC) 10/24/2022   Chronic tension-type headache, not intractable 05/23/2022   Trigger finger, left little finger 05/23/2022   Takotsubo cardiomyopathy 04/24/2022   S/P gastric sleeve procedure 04/24/2022   Adjustment disorder with mixed anxiety and depressed mood 04/24/2022   Perimenopausal symptom 04/24/2022   NSTEMI (non-ST elevated myocardial infarction) (HCC) 04/11/2022   Myopia with astigmatism and presbyopia, bilateral 03/19/2018   Obstructive sleep apnea  (adult) (pediatric) 10/13/2012   Diabetes (HCC) 11/16/2009   Hypertension 11/16/2009    ONSET DATE: 03/13/23  REFERRING DIAG:  G56.02 (ICD-10-CM) - Left carpal tunnel syndrome  M65.342 (ICD-10-CM) - Trigger ring finger of left hand    THERAPY DIAG:  Pain in left hand  Stiffness of left hand, not elsewhere classified  Muscle weakness (generalized)  Rationale for Evaluation and Treatment: Rehabilitation  SUBJECTIVE:   SUBJECTIVE STATEMENT: Pt reports pain is 2/10,  but it was worse Tuesday Pt accompanied by: self  PERTINENT HISTORY: status post left carpal tunnel release and left ring finger trigger finger release, date of surgery 03/13/2023. PMH HTN, DM, CHF  PRECAUTIONS: Other: no heavy lifting or submerging hand until 4 weeks post op  WEIGHT BEARING RESTRICTIONS: Yes no heavy lifting   PAIN:  Are you having pain? Yes: NPRS scale: 2/10 Pain location: hand, wrist  Pain description: aching Aggravating factors: straightening finger Relieving factors: meds  FALLS: Has patient fallen in last 6 months? No  LIVING ENVIRONMENT: Lives with: lives alone   PLOF: Independent  PATIENT GOALS: improve ROM and pain  NEXT MD VISIT: JUNE 8  OBJECTIVE:   HAND DOMINANCE: Right  ADLs: Overall ADLs: mod I with dominant RUE Transfers/ambulation related to ADLs:independent   FUNCTIONAL OUTCOME MEASURES: Quick Dash: 52.5%  UPPER EXTREMITY ROM:   LUE grossly 85 % composite finger flexion,  90% extension, limited for ring finger, pt opposes all digits  (Blank rows = not tested)  Active ROM Right eval Left eval  Thumb MCP (0-60)    Thumb IP (0-80)    Thumb Radial abd/add (0-55)     Thumb Palmar abd/add (0-45)     Thumb Opposition to Small Finger     Index MCP (0-90)     Index PIP (0-100)     Index DIP (0-70)      Long MCP (0-90)      Long PIP (0-100)      Long DIP (0-70)      Ring MCP (0-90) 80     Ring PIP (0-100)  5flex/ -25 ext    Ring DIP (0-70)      Little  MCP (0-90)      Little PIP (0-100)      Little DIP (0-70)      (Blank rows = not tested) 85% composite finger flexion Opposes all digits HAND FUNCTION: Grip strength: Right: NT lbs; Left: NT lbs- grip not tested due to pain and precautions  COORDINATION: Impaired due to pain and stiffness  SENSATION: WFL  EDEMA: moderate edema in L palm incision sites, Pt with 2 small open stiich sites at lower palm incision site. Top incision site at base of ring finger is fully closed  COGNITION: Overall cognitive status: Within functional limits for tasks assessed   OBSERVATIONS: Pt demonstrates significant pain with ROM of ring finger   TODAY'S TREATMENT:   05/15/23 Pt with mild edema present in palm at incision sites and less at wrist today Paraffin x 10 mins to left hand for pain and stiffness, no adverse reactions Ultrasound x  , 0.8w/cm 2, 20% min to ring finger palm and incision sites as  incision sites are closed with no adverse reactions for edema and scar tissue.  Forearm gym x 4 reps Flexbar, 10 reps each direction red for wrist strength and endurance Tendon gliding exercises,    05/12/23   Pt with mild edema present in palm at incision sites and less at wrist today Paraffin x 10 mins to left hand for pain and stiffness, no adverse reactions Ultrasound x10 min , 0.8w/cm 2, 20% min to ring finger palm and incision sites as  incision sites are closed with no adverse reactions for edema and scar tissue.  Tendon gliding exercises, passive wrist extension- prayer position  Forearm gym x 4 reps Flexbar, 10 reps each direction red for wrist strength and endurance Vecro roller for key grip and finger extension 4 reps each Finger extension of each digit on tabletop. Pt reports taking more rest breaks while working, and performing gentle stretch during rest breaks  05/07/23-  Pt with mild edema present in palm at incision sites and wrist. Paraffin x 10 mins to left hand for pain  and stiffness, no adverse reactions Ultrasound x10 min , 0.8w/cm 2, 20% min to ring finger palm and incision sites as  incision sites are closed with no adverse reactions for edema and scar tissue.  Tendon gliding exercises, thumb opposition/flex to base of 5th digit, IP flex/ext with MP blocked, passive wrist extension- prayer position and self PROM wrist flex. Yellow theraputty for grip and pinch, min v.c 10-20 reps each.  Edema improved after [place] and exercises.  Recommended that pt take 2 morning breaks and 2 afternoon breaks while working to perform tendon glides and wrist stretches and use ice pack at lunch and at end of work  day due to edema and stiffness     04/30/23- Pt with mild edema present in palm at incision sites and wrist. Paraffin x 10 mins to left hand for pain and stiffness, no adverse reactions [place] , 0.8w/cm 2, 20% to ring finger palm and incision sites as  incision sites are closed.  Tendon gliding exercises, active wrist flexion/ extension, passive wrist extension- prayer position Edema improved after [place] and exercises. Pt was instructed in yellow theraputty HEP for grip and pinch, min v.c 10-20 reps each. Pt was instructed to discontinue if her pain or swelling increases.  04/28/23-US , 0.8w/cm 2, 20% to ring finger palm and incision sites as  incision sites are closed. Paraffin x 8 mins to left hand for pain and stiffness, no adverse reactions Tendon gliding exercises, PIP  and DIP blocking exercises,active wrist flexion/ extension, passive wrist extension- prayer position, and passive wrist flexion, reverse blocking min v.c Pen rolling exercise, twirling pen between fingers and rotating stress balls in L palm for increased LUE functional use, min difficulty/ v.c   04/23/23 [place] , 0.8w/cm 2, 20% to ring finger palm and incision sites as  incision sites are closed. Paraffin x 8 mins to left hand for pain and stiffness. Tendon gliding exercises, PIP blocking  exercises,active wrist flexion/ extension, passive wrist extension- prayer position, and passive wrist flexion, reverse blocking min v.c and demonstration  Flipping playing cards, flicking playing cards, dealing playing cards for improved functional use of LUE  04/21/23- Pt with continues edema at palmar incision sites [place] , 0.8w/cm 2, 20% to ring palm and incision sites as  incision sites are closed. Paraffin x 8 mins to left hand for pain and stiffness. Tendon gliding exercises, PIP blocking exercises,, P/ROM to PIP in extension and active wrist flexion/ extension, passive wrist extension prayer position, min v.c and demonstration  Cupping to  palmar incision for scar mobilization  Gel pads applied under compression glove at incision sites which are now closed      PATIENT EDUCATION:  wrist flexion/ extension with 1 lbs weight, 10 reps each Education details: Patient Person educated: Patient Education method: Explanation, Demonstration, Verbal cues, handout Education comprehension: verbalized understanding and returned demonstration  HOME EXERCISE PROGRAM: 04/07/23-tendon gliding Pt has blocking exercises, A/ROM and P/ROM exercises  GOALS: Goals reviewed with patient? Yes  SHORT TERM GOALS: Target date: 05/06/23  I with initial HEP  Goal status:met, 04/23/23  2.  I with scar massage, edema control techniques   Goal status: met, 04/23/23  3.  Pt will demonstrate extensor lag of no mor than -5 for left ring finger. Baseline: -25 Goal status: -5 met 04/22/14  4.  Pt will demonstrate full composite finger flexion for increased ease with ADLs. Baseline: 85% Goal status:  ongoing 97% 04/30/23   LONG TERM GOALS: Target date: 06/10/23  I with updated HEP.   Goal status: INITIAL  2.  Pt will improve Quick Dash score to 45% or better. Baseline: 52.5% disability Goal status: INITIAL  3.  Pt will demonstrate grip strength of at least 25 lbs for increased functional  use.  Goal status: INITIAL  4.  Pt will resume use of LUE as a non dominant assist for ADLs/IADLS with pain no greater than 2/10. Baseline: not currently using consistently, pain 4/10 Goal status: INITIAL    ASSESSMENT:  CLINICAL IMPRESSION: Pt is progressing towards goals. She  demonstrates improving strength and functional use. PERFORMANCE DEFICITS: in functional skills including ADLs, IADLs, coordination, dexterity, edema,  ROM, strength, pain, fascial restrictions, flexibility, Fine motor control, Gross motor control, endurance, decreased knowledge of precautions, decreased knowledge of use of DME, skin integrity, and UE functional use,  , and psychosocial skills including coping strategies, environmental adaptation, habits, interpersonal interactions, and routines and behaviors.   IMPAIRMENTS: are limiting patient from ADLs, IADLs, rest and sleep, education, work, play, leisure, and social participation.   COMORBIDITIES: may have co-morbidities  that affects occupational performance. Patient will benefit from skilled OT to address above impairments and improve overall function.  MODIFICATION OR ASSISTANCE TO COMPLETE EVALUATION: No modification of tasks or assist necessary to complete an evaluation.  OT OCCUPATIONAL PROFILE AND HISTORY: Detailed assessment: Review of records and additional review of physical, cognitive, psychosocial history related to current functional performance.  CLINICAL DECISION MAKING: LOW - limited treatment options, no task modification necessary  REHAB POTENTIAL: Good  EVALUATION COMPLEXITY: Low      PLAN:  OT FREQUENCY: 2x/week  OT DURATION: 8 weeksplus eval- anticipate d/c after 4-6 weeks  PLANNED INTERVENTIONS: self care/ADL training, therapeutic exercise, therapeutic activity, neuromuscular re-education, manual therapy, scar mobilization, manual lymph drainage, passive range of motion, balance training, splinting, electrical stimulation,  ultrasound, paraffin, moist heat, cryotherapy, contrast bath, patient/family education, energy conservation, coping strategies training, DME and/or AE instructions, and Re-evaluation  RECOMMENDED OTHER SERVICES: n/a  CONSULTED AND AGREED WITH PLAN OF CARE: Patient  PLAN FOR NEXT SESSION:  Paraffin, [place], continue A/ROM, blocking exercises, scar massage, continue with light strengthening   Carylon Tamburro, OTR/L 05/15/2023, 3:11 PM

## 2023-05-15 NOTE — Progress Notes (Signed)
   Post-Op Visit Note   Patient: Alicia Booth           Date of Birth: November 04, 1971           MRN: [id number] Visit Date: 05/15/2023 PCP: Anne Ng, NP   Assessment & Plan:  Chief Complaint:  Chief Complaint  Patient presents with   Other    Post op follow up   Visit Diagnoses:  1. S/P carpal tunnel release   2. Trigger finger, left ring finger     Plan: Patient is a pleasant 52 year old female who comes in today 2 months status post left carpal tunnel release and left ring finger trigger finger release 03/13/2023.  She is still having persistent discomfort and swelling to both areas.  She has been in OT.  She has been taking naproxen without significant relief relief.  She still notes paresthesias to the left small finger as well as the inability to completely straighten the ring finger as the day progresses.  Examination of the left hand reveals fully healed surgical scars without complication.  She does have peri-incisional tenderness and swelling.  Painless range of motion of the wrist.  She is able to make a full composite fist.  At this point, she will continue with OT.  We have also recommended giving this more time.  Follow-up with [place] as needed.  Follow-Up Instructions: Return if symptoms worsen or fail to improve.   Orders:  No orders of the defined types were placed in this encounter.  No orders of the defined types were placed in this encounter.   Imaging: No new imaging  PMFS History: Patient Active Problem List   Diagnosis Date Noted   Chronic diastolic congestive heart failure (HCC) 01/24/2023   Chronic vaginitis 10/24/2022   Hyperlipidemia associated with type 2 diabetes mellitus (HCC) 10/24/2022   Chronic tension-type headache, not intractable 05/23/2022   Trigger finger, left little finger 05/23/2022   Takotsubo cardiomyopathy 04/24/2022   S/P gastric sleeve procedure 04/24/2022   Adjustment disorder with mixed anxiety and depressed mood 04/24/2022    Perimenopausal symptom 04/24/2022   NSTEMI (non-ST elevated myocardial infarction) (HCC) 04/11/2022   Myopia with astigmatism and presbyopia, bilateral 03/19/2018   Obstructive sleep apnea (adult) (pediatric) 10/13/2012   Diabetes (HCC) 11/16/2009   Hypertension 11/16/2009   Past Medical History:  Diagnosis Date   Diabetes (HCC)    High cholesterol    Hypertension    Stress-induced cardiomyopathy     Family History  Problem Relation Age of Onset   Breast cancer Mother 45   Heart attack Maternal Grandmother 40    Past Surgical History:  Procedure Laterality Date   BARIATRIC SURGERY  2014   BREAST BIOPSY Left    lymph node bx benign   HEEL SPUR EXCISION     LEFT HEART CATH AND CORONARY ANGIOGRAPHY N/A 04/11/2022   Procedure: LEFT HEART CATH AND CORONARY ANGIOGRAPHY;  Surgeon: Corky Crafts, MD;  Location: [facility];  Service: Cardiovascular;  Laterality: N/A;   MENISCUS REPAIR     tummy tuck     Social History   Occupational History   Not on file  Tobacco Use   Smoking status: Never   Smokeless tobacco: Never  Vaping Use   Vaping Use: Never used  Substance and Sexual Activity   Alcohol use: Yes    Comment: occassionally   Drug use: Never   Sexual activity: Yes

## 2023-05-15 NOTE — Patient Instructions (Signed)
Wrist Flexion: Resisted    With right palm up, ___1_ pound weight or can in hand, bend wrist up. Return slowly. Repeat _10___ times per set. Do _1___ sets per session. Do __1__ sessions per day. Repeat with palm down 10 reps, 1x day.  Copyright  [employer]. All rights reserved.

## 2023-05-19 ENCOUNTER — Other Ambulatory Visit: Payer: Self-pay | Admitting: Cardiology

## 2023-05-19 ENCOUNTER — Ambulatory Visit: Payer: 59 | Admitting: Occupational Therapy

## 2023-05-19 ENCOUNTER — Encounter: Payer: Self-pay | Admitting: Occupational Therapy

## 2023-05-19 DIAGNOSIS — M25642 Stiffness of left hand, not elsewhere classified: Secondary | ICD-10-CM

## 2023-05-19 DIAGNOSIS — M79642 Pain in left hand: Secondary | ICD-10-CM | POA: Diagnosis not present

## 2023-05-19 DIAGNOSIS — M6281 Muscle weakness (generalized): Secondary | ICD-10-CM

## 2023-05-19 NOTE — Therapy (Signed)
OUTPATIENT OCCUPATIONAL THERAPY ORTHO treatment  Patient Name: Alicia Booth MRN: [id number] DOB:September 29, 1971, 52 y.o., female Today's Date: 05/19/2023  PCP: Dr. Elease Etienne REFERRING PROVIDER: Jari Sportsman PA-C, Dr. Roda Shutters  END OF SESSION:  OT End of Session - 05/19/23 1539     Number of Visits 17    Date for OT Re-Evaluation 06/10/23    Authorization Type aetna    Authorization - Number of Visits 60    Activity Tolerance Patient tolerated treatment well    Behavior During Therapy [facility] for tasks assessed/performed                   Past Medical History:  Diagnosis Date   Diabetes (HCC)    High cholesterol    Hypertension    Stress-induced cardiomyopathy    Past Surgical History:  Procedure Laterality Date   BARIATRIC SURGERY  2014   BREAST BIOPSY Left    lymph node bx benign   HEEL SPUR EXCISION     LEFT HEART CATH AND CORONARY ANGIOGRAPHY N/A 04/11/2022   Procedure: LEFT HEART CATH AND CORONARY ANGIOGRAPHY;  Surgeon: Corky Crafts, MD;  Location: [facility];  Service: Cardiovascular;  Laterality: N/A;   MENISCUS REPAIR     tummy tuck     Patient Active Problem List   Diagnosis Date Noted   Chronic diastolic congestive heart failure (HCC) 01/24/2023   Chronic vaginitis 10/24/2022   Hyperlipidemia associated with type 2 diabetes mellitus (HCC) 10/24/2022   Chronic tension-type headache, not intractable 05/23/2022   Trigger finger, left little finger 05/23/2022   Takotsubo cardiomyopathy 04/24/2022   S/P gastric sleeve procedure 04/24/2022   Adjustment disorder with mixed anxiety and depressed mood 04/24/2022   Perimenopausal symptom 04/24/2022   NSTEMI (non-ST elevated myocardial infarction) (HCC) 04/11/2022   Myopia with astigmatism and presbyopia, bilateral 03/19/2018   Obstructive sleep apnea (adult) (pediatric) 10/13/2012   Diabetes (HCC) 11/16/2009   Hypertension 11/16/2009    ONSET DATE: 03/13/23  REFERRING DIAG:  G56.02 (ICD-10-CM) - Left  carpal tunnel syndrome  M65.342 (ICD-10-CM) - Trigger ring finger of left hand    THERAPY DIAG:  Pain in left hand  Stiffness of left hand, not elsewhere classified  Muscle weakness (generalized)  Rationale for Evaluation and Treatment: Rehabilitation  SUBJECTIVE:   SUBJECTIVE STATEMENT: Pt reports pain is 1-2/10 Pt accompanied by: self  PERTINENT HISTORY: status post left carpal tunnel release and left ring finger trigger finger release, date of surgery 03/13/2023. PMH HTN, DM, CHF  PRECAUTIONS: Other: no heavy lifting or submerging hand until 4 weeks post op  WEIGHT BEARING RESTRICTIONS: Yes no heavy lifting   PAIN:  Are you having pain? Yes: NPRS scale: 2/10 Pain location: hand, wrist  Pain description: aching Aggravating factors: straightening finger Relieving factors: meds  FALLS: Has patient fallen in last 6 months? No  LIVING ENVIRONMENT: Lives with: lives alone   PLOF: Independent  PATIENT GOALS: improve ROM and pain  NEXT MD VISIT: JUNE 8  OBJECTIVE:   HAND DOMINANCE: Right  ADLs: Overall ADLs: mod I with dominant RUE Transfers/ambulation related to ADLs:independent   FUNCTIONAL OUTCOME MEASURES: Quick Dash: 52.5%  UPPER EXTREMITY ROM:   LUE grossly 85 % composite finger flexion, 90% extension, limited for ring finger, pt opposes all digits  (Blank rows = not tested)  Active ROM Right eval Left eval  Thumb MCP (0-60)    Thumb IP (0-80)    Thumb Radial abd/add (0-55)     Thumb Palmar  abd/add (0-45)     Thumb Opposition to Small Finger     Index MCP (0-90)     Index PIP (0-100)     Index DIP (0-70)      Long MCP (0-90)      Long PIP (0-100)      Long DIP (0-70)      Ring MCP (0-90) 80     Ring PIP (0-100)  44flex/ -25 ext    Ring DIP (0-70)      Little MCP (0-90)      Little PIP (0-100)      Little DIP (0-70)      (Blank rows = not tested) 85% composite finger flexion Opposes all digits HAND FUNCTION: Grip strength: Right: NT  lbs; Left: NT lbs- grip not tested due to pain and precautions  COORDINATION: Impaired due to pain and stiffness  SENSATION: WFL  EDEMA: moderate edema in L palm incision sites, Pt with 2 small open stiich sites at lower palm incision site. Top incision site at base of ring finger is fully closed  COGNITION: Overall cognitive status: Within functional limits for tasks assessed   OBSERVATIONS: Pt demonstrates significant pain with ROM of ring finger   TODAY'S TREATMENT:   05/19/23-Paraffin x 8 mins to left hand for pain and stiffness, no adverse reactions Ultrasound x  , 0.8w/cm 2, 20% min to ring finger palm and in with no adverse reactions for edema and scar tissue.  Velcro roller for key roller and finger roller 3-4 reps each Wrist flexion/ extension with 1 lbs weight  x 10 reps each direction min v.c  Hook fist x 10 reps, reverse blocking x 10 reps  Forearm gym x 4 reps Flexbar red 10 reps each direction   05/15/23 Pt with mild edema present in palm at incision sites and less at wrist today Paraffin x 10 mins to left hand for pain and stiffness, no adverse reactions Ultrasound x  , 0.8w/cm 2, 20% min to ring finger palm and  with no adverse reactions for edema and scar tissue.  Forearm gym x 4 reps Flexbar, 10 reps each direction red for wrist strength and endurance Tendon gliding exercises,    05/12/23   Pt with mild edema present in palm at incision sites and less at wrist today Paraffin x 10 mins to left hand for pain and stiffness, no adverse reactions Ultrasound x10 min , 0.8w/cm 2, 20% min to ring finger palm incision sites are closed with no adverse reactions for edema and scar tissue.  Tendon gliding exercises, passive wrist extension- prayer position  Forearm gym x 4 reps Flexbar, 10 reps each direction red for wrist strength and endurance Vecro roller for key grip and finger extension 4 reps each Finger extension of each digit on tabletop. Pt  reports taking more rest breaks while working, and performing gentle stretch during rest breaks  05/07/23-  Pt with mild edema present in palm at incision sites and wrist. Paraffin x 10 mins to left hand for pain and stiffness, no adverse reactions Ultrasound x10 min , 0.8w/cm 2, 20% min to ring finger palm and incision sites as  incision sites are closed with no adverse reactions for edema and scar tissue.  Tendon gliding exercises, thumb opposition/flex to base of 5th digit, IP flex/ext with MP blocked, passive wrist extension- prayer position and self PROM wrist flex. Yellow theraputty for grip and pinch, min v.c 10-20 reps each.  Edema improved after [place] and exercises.  Recommended that pt take 2 morning breaks and 2 afternoon breaks while working to perform tendon glides and wrist stretches and use ice pack at lunch and at end of work day due to edema and stiffness     04/30/23- Pt with mild edema present in palm at incision sites and wrist. Paraffin x 10 mins to left hand for pain and stiffness, no adverse reactions [place] , 0.8w/cm 2, 20% to ring finger palm and incision sites as  incision sites are closed.  Tendon gliding exercises, active wrist flexion/ extension, passive wrist extension- prayer position Edema improved after [place] and exercises. Pt was instructed in yellow theraputty HEP for grip and pinch, min v.c 10-20 reps each. Pt was instructed to discontinue if her pain or swelling increases.  04/28/23-US , 0.8w/cm 2, 20% to ring finger palm and incision sites as  incision sites are closed. Paraffin x 8 mins to left hand for pain and stiffness, no adverse reactions Tendon gliding exercises, PIP  and DIP blocking exercises,active wrist flexion/ extension, passive wrist extension- prayer position, and passive wrist flexion, reverse blocking min v.c Pen rolling exercise, twirling pen between fingers and rotating stress balls in L palm for increased LUE functional use, min difficulty/  v.c   04/23/23 [place] , 0.8w/cm 2, 20% to ring finger palm and incision sites as  incision sites are closed. Paraffin x 8 mins to left hand for pain and stiffness. Tendon gliding exercises, PIP blocking exercises,active wrist flexion/ extension, passive wrist extension- prayer position, and passive wrist flexion, reverse blocking min v.c and demonstration  Flipping playing cards, flicking playing cards, dealing playing cards for improved functional use of LUE  04/21/23- Pt with continues edema at palmar incision sites [place] , 0.8w/cm 2, 20% to ring palm and incision sites as  incision sites are closed. Paraffin x 8 mins to left hand for pain and stiffness. Tendon gliding exercises, PIP blocking exercises,, P/ROM to PIP in extension and active wrist flexion/ extension, passive wrist extension prayer position, min v.c and demonstration  Cupping to  palmar incision for scar mobilization  Gel pads applied under compression glove at incision sites which are now closed      PATIENT EDUCATION:   Education details: see above Person educated: Patient Education method: Programmer, multimedia, Demonstration, Verbal cues, handout Education comprehension: verbalized understanding and returned demonstration  HOME EXERCISE PROGRAM: 04/07/23-tendon gliding Pt has blocking exercises, A/ROM and P/ROM exercises  GOALS: Goals reviewed with patient? Yes  SHORT TERM GOALS: Target date: 05/06/23  I with initial HEP  Goal status:met, 04/23/23  2.  I with scar massage, edema control techniques   Goal status: met, 04/23/23  3.  Pt will demonstrate extensor lag of no mor than -5 for left ring finger. Baseline: -25 Goal status: -5 met 04/22/14  4.  Pt will demonstrate full composite finger flexion for increased ease with ADLs. Baseline: 85% Goal status:  ongoing 97% 04/30/23   LONG TERM GOALS: Target date: 06/10/23  I with updated HEP.   Goal status: INITIAL  2.  Pt will improve Quick Dash score to 45% or  better. Baseline: 52.5% disability Goal status: INITIAL  3.  Pt will demonstrate grip strength of at least 25 lbs for increased functional use.  Goal status: INITIAL  4.  Pt will resume use of LUE as a non dominant assist for ADLs/IADLS with pain no greater than 2/10. Baseline: not currently using consistently, pain 4/10 Goal status: INITIAL    ASSESSMENT:  CLINICAL IMPRESSION: Pt  is progressing towards goals. She demonstrates improving strength. Pt reports she was able to turn on shower today. PERFORMANCE DEFICITS: in functional skills including ADLs, IADLs, coordination, dexterity, edema, ROM, strength, pain, fascial restrictions, flexibility, Fine motor control, Gross motor control, endurance, decreased knowledge of precautions, decreased knowledge of use of DME, skin integrity, and UE functional use,  , and psychosocial skills including coping strategies, environmental adaptation, habits, interpersonal interactions, and routines and behaviors.   IMPAIRMENTS: are limiting patient from ADLs, IADLs, rest and sleep, education, work, play, leisure, and social participation.   COMORBIDITIES: may have co-morbidities  that affects occupational performance. Patient will benefit from skilled OT to address above impairments and improve overall function.  MODIFICATION OR ASSISTANCE TO COMPLETE EVALUATION: No modification of tasks or assist necessary to complete an evaluation.  OT OCCUPATIONAL PROFILE AND HISTORY: Detailed assessment: Review of records and additional review of physical, cognitive, psychosocial history related to current functional performance.  CLINICAL DECISION MAKING: LOW - limited treatment options, no task modification necessary  REHAB POTENTIAL: Good  EVALUATION COMPLEXITY: Low      PLAN:  OT FREQUENCY: 2x/week  OT DURATION: 8 weeksplus eval- anticipate d/c after 4-6 weeks  PLANNED INTERVENTIONS: self care/ADL training, therapeutic exercise, therapeutic  activity, neuromuscular re-education, manual therapy, scar mobilization, manual lymph drainage, passive range of motion, balance training, splinting, electrical stimulation, ultrasound, paraffin, moist heat, cryotherapy, contrast bath, patient/family education, energy conservation, coping strategies training, DME and/or AE instructions, and Re-evaluation  RECOMMENDED OTHER SERVICES: n/a  CONSULTED AND AGREED WITH PLAN OF CARE: Patient  PLAN FOR NEXT SESSION:  Paraffin, [place],  scar massage, continue with light strengthening   Vilda Zollner, OTR/L 05/19/2023, 3:40 PM

## 2023-05-22 ENCOUNTER — Ambulatory Visit: Payer: 59 | Admitting: Occupational Therapy

## 2023-05-22 ENCOUNTER — Encounter: Payer: Self-pay | Admitting: Nurse Practitioner

## 2023-05-22 DIAGNOSIS — L2089 Other atopic dermatitis: Secondary | ICD-10-CM | POA: Insufficient documentation

## 2023-05-22 DIAGNOSIS — M6281 Muscle weakness (generalized): Secondary | ICD-10-CM

## 2023-05-22 DIAGNOSIS — M25642 Stiffness of left hand, not elsewhere classified: Secondary | ICD-10-CM

## 2023-05-22 DIAGNOSIS — L219 Seborrheic dermatitis, unspecified: Secondary | ICD-10-CM

## 2023-05-22 DIAGNOSIS — M79642 Pain in left hand: Secondary | ICD-10-CM | POA: Diagnosis not present

## 2023-05-22 MED ORDER — TRIAMCINOLONE ACETONIDE 0.1 % EX CREA: 1.0000 | TOPICAL_CREAM | Freq: Two times a day (BID) | CUTANEOUS | 0 refills | Status: DC

## 2023-05-22 NOTE — Telephone Encounter (Signed)
Please see the MyChart message reply(ies) for my assessment and plan.  The patient gave consent for this Medical Advice Message and is aware that it may result in a bill to their insurance company as well as the possibility that this may result in a co-payment or deductible. They are an established patient, but are not seeking medical advice exclusively about a problem treated during an in person or video visit in the last 7 days. I did not recommend an in person or video visit within 7 days of my reply.  I spent a total of 10 minutes cumulative time within 7 days through MyChart messaging Lillith Mcneff, NP  

## 2023-05-22 NOTE — Therapy (Signed)
OUTPATIENT OCCUPATIONAL THERAPY ORTHO treatment  Patient Name: Alicia Booth MRN: [id number] DOB:[birth year], 52 y.o., female Today's Date: 05/22/2023  PCP: Dr. Elease Etienne REFERRING PROVIDER: Jari Sportsman PA-C, Dr. Roda Shutters  END OF SESSION:  OT End of Session - 05/22/23 1438     Visit Number 1    Number of Visits 17    Date for OT Re-Evaluation 06/10/23    Authorization Type aetna    Authorization - Number of Visits 60    OT Start Time 1407    OT Stop Time 1500    OT Time Calculation (min) 53 min    Activity Tolerance Patient tolerated treatment well    Behavior During Therapy WFL for tasks assessed/performed                    Past Medical History:  Diagnosis Date   Diabetes (HCC)    High cholesterol    Hypertension    Stress-induced cardiomyopathy    Past Surgical History:  Procedure Laterality Date   BARIATRIC SURGERY  2014   BREAST BIOPSY Left    lymph node bx benign   HEEL SPUR EXCISION     LEFT HEART CATH AND CORONARY ANGIOGRAPHY N/A 04/11/2022   Procedure: LEFT HEART CATH AND CORONARY ANGIOGRAPHY;  Surgeon: Corky Crafts, MD;  Location: [facility];  Service: Cardiovascular;  Laterality: N/A;   MENISCUS REPAIR     tummy tuck     Patient Active Problem List   Diagnosis Date Noted   Chronic diastolic congestive heart failure (HCC) 01/24/2023   Chronic vaginitis 10/24/2022   Hyperlipidemia associated with type 2 diabetes mellitus (HCC) 10/24/2022   Chronic tension-type headache, not intractable 05/23/2022   Trigger finger, left little finger 05/23/2022   Takotsubo cardiomyopathy 04/24/2022   S/P gastric sleeve procedure 04/24/2022   Adjustment disorder with mixed anxiety and depressed mood 04/24/2022   Perimenopausal symptom 04/24/2022   NSTEMI (non-ST elevated myocardial infarction) (HCC) 04/11/2022   Myopia with astigmatism and presbyopia, bilateral 03/19/2018   Obstructive sleep apnea (adult) (pediatric) 10/13/2012   Diabetes (HCC) 11/16/2009    Hypertension 11/16/2009    ONSET DATE: 03/13/23  REFERRING DIAG:  G56.02 (ICD-10-CM) - Left carpal tunnel syndrome  M65.342 (ICD-10-CM) - Trigger ring finger of left hand    THERAPY DIAG:  Pain in left hand  Stiffness of left hand, not elsewhere classified  Muscle weakness (generalized)  Rationale for Evaluation and Treatment: Rehabilitation  SUBJECTIVE:   SUBJECTIVE STATEMENT:  No pain on arrival  Pt accompanied by: self  PERTINENT HISTORY: status post left carpal tunnel release and left ring finger trigger finger release, date of surgery 03/13/2023. PMH HTN, DM, CHF  PRECAUTIONS: Other: no heavy lifting or submerging hand until 4 weeks post op  WEIGHT BEARING RESTRICTIONS: Yes no heavy lifting   PAIN:  Are you having pain?  None on arrival 2/10 with exercises Pain location: hand, wrist  Pain description: aching Aggravating factors: straightening finger Relieving factors: meds  FALLS: Has patient fallen in last 6 months? No  LIVING ENVIRONMENT: Lives with: lives alone   PLOF: Independent  PATIENT GOALS: improve ROM and pain  NEXT MD VISIT: JUNE 8  OBJECTIVE:   HAND DOMINANCE: Right  ADLs: Overall ADLs: mod I with dominant RUE Transfers/ambulation related to ADLs:independent   FUNCTIONAL OUTCOME MEASURES: Quick Dash: 52.5%  UPPER EXTREMITY ROM:   LUE grossly 85 % composite finger flexion, 90% extension, limited for ring finger, pt opposes all digits  (Blank  rows = not tested)  Active ROM Right eval Left eval  Thumb MCP (0-60)    Thumb IP (0-80)    Thumb Radial abd/add (0-55)     Thumb Palmar abd/add (0-45)     Thumb Opposition to Small Finger     Index MCP (0-90)     Index PIP (0-100)     Index DIP (0-70)      Long MCP (0-90)      Long PIP (0-100)      Long DIP (0-70)      Ring MCP (0-90) 80     Ring PIP (0-100)  52flex/ -25 ext    Ring DIP (0-70)      Little MCP (0-90)      Little PIP (0-100)      Little DIP (0-70)      (Blank rows  = not tested) 85% composite finger flexion Opposes all digits HAND FUNCTION: Grip strength: Right: NT lbs; Left: NT lbs- grip not tested due to pain and precautions  COORDINATION: Impaired due to pain and stiffness  SENSATION: WFL  EDEMA: moderate edema in L palm incision sites, Pt with 2 small open stiich sites at lower palm incision site. Top incision site at base of ring finger is fully closed  COGNITION: Overall cognitive status: Within functional limits for tasks assessed   OBSERVATIONS: Pt demonstrates significant pain with ROM of ring finger   TODAY'S TREATMENT:  05/22/23-Paraffin x 10 mins to left hand for pain and stiffness, no adverse reactions Ultrasound x  , 0.8w/cm 2, 20% min to ring finger palm and in with no adverse reactions for edema and scar tissue.  Tendon gliding exercises 10 reps each, finger abduction/ adduction 10 reps  Wrist flexion/ extension 10 reps each with 1 lbs weight  Graded clothespins 1-8# for sustained pinch with middle and ring finger Flexbar x 10 reps  each direction Ice pack to LUE x 5 mins  for pain and swelling, no adverse reactions        05/19/23-Paraffin x 8 mins to left hand for pain and stiffness, no adverse reactions Ultrasound x  , 0.8w/cm 2, 20% min to ring finger palm and in with no adverse reactions for edema and scar tissue.  Velcro roller for key roller and finger roller 3-4 reps each Wrist flexion/ extension with 1 lbs weight  x 10 reps each direction min v.c  Hook fist x 10 reps, reverse blocking x 10 reps  Forearm gym x 4 reps Flexbar red 10 reps each direction   05/15/23 Pt with mild edema present in palm at incision sites and less at wrist today Paraffin x 10 mins to left hand for pain and stiffness, no adverse reactions Ultrasound x  , 0.8w/cm 2, 20% min to ring finger palm and  with no adverse reactions for edema and scar tissue.  Forearm gym x 4 reps Flexbar, 10 reps each direction red for  wrist strength and endurance Tendon gliding exercises,    05/12/23   Pt with mild edema present in palm at incision sites and less at wrist today Paraffin x 10 mins to left hand for pain and stiffness, no adverse reactions Ultrasound x10 min , 0.8w/cm 2, 20% min to ring finger palm incision sites are closed with no adverse reactions for edema and scar tissue.  Tendon gliding exercises, passive wrist extension- prayer position  Forearm gym x 4 reps Flexbar, 10 reps each direction red for wrist strength and endurance Vecro roller for  key grip and finger extension 4 reps each Finger extension of each digit on tabletop. Pt reports taking more rest breaks while working, and performing gentle stretch during rest breaks  05/07/23-  Pt with mild edema present in palm at incision sites and wrist. Paraffin x 10 mins to left hand for pain and stiffness, no adverse reactions Ultrasound x10 min , 0.8w/cm 2, 20% min to ring finger palm and incision sites as  incision sites are closed with no adverse reactions for edema and scar tissue.  Tendon gliding exercises, thumb opposition/flex to base of 5th digit, IP flex/ext with MP blocked, passive wrist extension- prayer position and self PROM wrist flex. Yellow theraputty for grip and pinch, min v.c 10-20 reps each.  Edema improved after [place] and exercises.  Recommended that pt take 2 morning breaks and 2 afternoon breaks while working to perform tendon glides and wrist stretches and use ice pack at lunch and at end of work day due to edema and stiffness     04/30/23- Pt with mild edema present in palm at incision sites and wrist. Paraffin x 10 mins to left hand for pain and stiffness, no adverse reactions [place] , 0.8w/cm 2, 20% to ring finger palm and incision sites as  incision sites are closed.  Tendon gliding exercises, active wrist flexion/ extension, passive wrist extension- prayer position Edema improved after [place] and exercises. Pt was instructed in  yellow theraputty HEP for grip and pinch, min v.c 10-20 reps each. Pt was instructed to discontinue if her pain or swelling increases.  04/28/23-US , 0.8w/cm 2, 20% to ring finger palm and incision sites as  incision sites are closed. Paraffin x 8 mins to left hand for pain and stiffness, no adverse reactions Tendon gliding exercises, PIP  and DIP blocking exercises,active wrist flexion/ extension, passive wrist extension- prayer position, and passive wrist flexion, reverse blocking min v.c Pen rolling exercise, twirling pen between fingers and rotating stress balls in L palm for increased LUE functional use, min difficulty/ v.c   04/23/23 [place] , 0.8w/cm 2, 20% to ring finger palm and incision sites as  incision sites are closed. Paraffin x 8 mins to left hand for pain and stiffness. Tendon gliding exercises, PIP blocking exercises,active wrist flexion/ extension, passive wrist extension- prayer position, and passive wrist flexion, reverse blocking min v.c and demonstration  Flipping playing cards, flicking playing cards, dealing playing cards for improved functional use of LUE  04/21/23- Pt with continues edema at palmar incision sites [place] , 0.8w/cm 2, 20% to ring palm and incision sites as  incision sites are closed. Paraffin x 8 mins to left hand for pain and stiffness. Tendon gliding exercises, PIP blocking exercises,, P/ROM to PIP in extension and active wrist flexion/ extension, passive wrist extension prayer position, min v.c and demonstration  Cupping to  palmar incision for scar mobilization  Gel pads applied under compression glove at incision sites which are now closed      PATIENT EDUCATION:   Education details: see above Person educated: Patient Education method: Programmer, multimedia, Demonstration, Verbal cues, handout Education comprehension: verbalized understanding and returned demonstration  HOME EXERCISE PROGRAM: 04/07/23-tendon gliding Pt has blocking exercises, A/ROM  and P/ROM exercises  GOALS: Goals reviewed with patient? Yes  SHORT TERM GOALS: Target date: 05/06/23  I with initial HEP  Goal status:met, 04/23/23  2.  I with scar massage, edema control techniques   Goal status: met, 04/23/23  3.  Pt will demonstrate extensor lag of no mor  than -5 for left ring finger. Baseline: -25 Goal status: -5 met 04/22/14  4.  Pt will demonstrate full composite finger flexion for increased ease with ADLs. Baseline: 85% Goal status:  ongoing 97% 04/30/23   LONG TERM GOALS: Target date: 06/10/23  I with updated HEP.   Goal status: INITIAL  2.  Pt will improve Quick Dash score to 45% or better. Baseline: 52.5% disability Goal status: INITIAL  3.  Pt will demonstrate grip strength of at least 25 lbs for increased functional use.  Goal status: INITIAL  4.  Pt will resume use of LUE as a non dominant assist for ADLs/IADLS with pain no greater than 2/10. Baseline: not currently using consistently, pain 4/10 Goal status: INITIAL    ASSESSMENT:  CLINICAL IMPRESSION: Pt is progressing towards goals. She demonstrates improving strength and functional use, she has continued mild swelling. PERFORMANCE DEFICITS: in functional skills including ADLs, IADLs, coordination, dexterity, edema, ROM, strength, pain, fascial restrictions, flexibility, Fine motor control, Gross motor control, endurance, decreased knowledge of precautions, decreased knowledge of use of DME, skin integrity, and UE functional use,  , and psychosocial skills including coping strategies, environmental adaptation, habits, interpersonal interactions, and routines and behaviors.   IMPAIRMENTS: are limiting patient from ADLs, IADLs, rest and sleep, education, work, play, leisure, and social participation.   COMORBIDITIES: may have co-morbidities  that affects occupational performance. Patient will benefit from skilled OT to address above impairments and improve overall function.  MODIFICATION  OR ASSISTANCE TO COMPLETE EVALUATION: No modification of tasks or assist necessary to complete an evaluation.  OT OCCUPATIONAL PROFILE AND HISTORY: Detailed assessment: Review of records and additional review of physical, cognitive, psychosocial history related to current functional performance.  CLINICAL DECISION MAKING: LOW - limited treatment options, no task modification necessary  REHAB POTENTIAL: Good  EVALUATION COMPLEXITY: Low      PLAN:  OT FREQUENCY: 2x/week  OT DURATION: 8 weeksplus eval- anticipate d/c after 4-6 weeks  PLANNED INTERVENTIONS: self care/ADL training, therapeutic exercise, therapeutic activity, neuromuscular re-education, manual therapy, scar mobilization, manual lymph drainage, passive range of motion, balance training, splinting, electrical stimulation, ultrasound, paraffin, moist heat, cryotherapy, contrast bath, patient/family education, energy conservation, coping strategies training, DME and/or AE instructions, and Re-evaluation  RECOMMENDED OTHER SERVICES: n/a  CONSULTED AND AGREED WITH PLAN OF CARE: Patient  PLAN FOR NEXT SESSION:  Paraffin, [place],  scar massage, continue with light strengthening   Steel Kerney, OTR/L 05/22/2023, 3:00 PM

## 2023-05-22 NOTE — Addendum Note (Signed)
Addended by: Michaela Corner on: 05/22/2023 03:43 PM   Modules accepted: Orders

## 2023-06-02 ENCOUNTER — Ambulatory Visit: Payer: 59 | Admitting: Occupational Therapy

## 2023-06-02 DIAGNOSIS — M79642 Pain in left hand: Secondary | ICD-10-CM | POA: Diagnosis not present

## 2023-06-02 DIAGNOSIS — M6281 Muscle weakness (generalized): Secondary | ICD-10-CM

## 2023-06-02 DIAGNOSIS — M25642 Stiffness of left hand, not elsewhere classified: Secondary | ICD-10-CM

## 2023-06-02 NOTE — Therapy (Signed)
OUTPATIENT OCCUPATIONAL THERAPY ORTHO treatment  Patient Name: Alicia Booth MRN: [id number] DOB:October 29, 1971, 52 y.o., female Today's Date: 06/02/2023  PCP: Dr. Elease Etienne REFERRING PROVIDER: Jari Sportsman PA-C, Dr. Roda Shutters  END OF SESSION:  OT End of Session - 06/02/23 1449     Visit Number 12    Number of Visits 17    Date for OT Re-Evaluation 06/10/23    Authorization Type aetna    Authorization - Visit Number 12    Authorization - Number of Visits 60    OT Start Time 1446    OT Stop Time 1540    OT Time Calculation (min) 54 min    Activity Tolerance Patient tolerated treatment well    Behavior During Therapy WFL for tasks assessed/performed                    Past Medical History:  Diagnosis Date   Diabetes (HCC)    High cholesterol    Hypertension    Stress-induced cardiomyopathy    Past Surgical History:  Procedure Laterality Date   BARIATRIC SURGERY  2014   BREAST BIOPSY Left    lymph node bx benign   HEEL SPUR EXCISION     LEFT HEART CATH AND CORONARY ANGIOGRAPHY N/A 04/11/2022   Procedure: LEFT HEART CATH AND CORONARY ANGIOGRAPHY;  Surgeon: Corky Crafts, MD;  Location: [facility];  Service: Cardiovascular;  Laterality: N/A;   MENISCUS REPAIR     tummy tuck     Patient Active Problem List   Diagnosis Date Noted   Seborrheic dermatitis of scalp 05/22/2023   Flexural atopic dermatitis 05/22/2023   Chronic diastolic congestive heart failure (HCC) 01/24/2023   Chronic vaginitis 10/24/2022   Hyperlipidemia associated with type 2 diabetes mellitus (HCC) 10/24/2022   Chronic tension-type headache, not intractable 05/23/2022   Trigger finger, left little finger 05/23/2022   Takotsubo cardiomyopathy 04/24/2022   S/P gastric sleeve procedure 04/24/2022   Adjustment disorder with mixed anxiety and depressed mood 04/24/2022   Perimenopausal symptom 04/24/2022   NSTEMI (non-ST elevated myocardial infarction) (HCC) 04/11/2022   Myopia with astigmatism  and presbyopia, bilateral 03/19/2018   Obstructive sleep apnea (adult) (pediatric) 10/13/2012   Diabetes (HCC) 11/16/2009   Hypertension 11/16/2009    ONSET DATE: 03/13/23  REFERRING DIAG:  G56.02 (ICD-10-CM) - Left carpal tunnel syndrome  M65.342 (ICD-10-CM) - Trigger ring finger of left hand    THERAPY DIAG:  Pain in left hand  Stiffness of left hand, not elsewhere classified  Muscle weakness (generalized)  Rationale for Evaluation and Treatment: Rehabilitation  SUBJECTIVE:   SUBJECTIVE STATEMENT:  No pain on arrival  Pt accompanied by: self  PERTINENT HISTORY: status post left carpal tunnel release and left ring finger trigger finger release, date of surgery 03/13/2023. PMH HTN, DM, CHF  PRECAUTIONS: Other: no heavy lifting or submerging hand until 4 weeks post op  WEIGHT BEARING RESTRICTIONS: Yes no heavy lifting   PAIN:  Are you having pain?   2/10  Pain location: hand, wrist  Pain description: aching Aggravating factors: straightening finger Relieving factors: meds  FALLS: Has patient fallen in last 6 months? No  LIVING ENVIRONMENT: Lives with: lives alone   PLOF: Independent  PATIENT GOALS: improve ROM and pain  NEXT MD VISIT: JUNE 8  OBJECTIVE:   HAND DOMINANCE: Right  ADLs: Overall ADLs: mod I with dominant RUE Transfers/ambulation related to ADLs:independent   FUNCTIONAL OUTCOME MEASURES: Quick Dash: 52.5%  UPPER EXTREMITY ROM:   LUE  grossly 85 % composite finger flexion, 90% extension, limited for ring finger, pt opposes all digits  (Blank rows = not tested)  Active ROM Right eval Left eval  Thumb MCP (0-60)    Thumb IP (0-80)    Thumb Radial abd/add (0-55)     Thumb Palmar abd/add (0-45)     Thumb Opposition to Small Finger     Index MCP (0-90)     Index PIP (0-100)     Index DIP (0-70)      Long MCP (0-90)      Long PIP (0-100)      Long DIP (0-70)      Ring MCP (0-90) 80     Ring PIP (0-100)  37flex/ -25 ext    Ring DIP  (0-70)      Little MCP (0-90)      Little PIP (0-100)      Little DIP (0-70)      (Blank rows = not tested) 85% composite finger flexion Opposes all digits HAND FUNCTION: Grip strength: Right: NT lbs; Left: NT lbs- grip not tested due to pain and precautions  COORDINATION: Impaired due to pain and stiffness  SENSATION: WFL  EDEMA: moderate edema in L palm incision sites, Pt with 2 small open stiich sites at lower palm incision site. Top incision site at base of ring finger is fully closed  COGNITION: Overall cognitive status: Within functional limits for tasks assessed   OBSERVATIONS: Pt demonstrates significant pain with ROM of ring finger   TODAY'S TREATMENT: 06/02/23-Paraffin x 9 mins to left hand for pain and stiffness, no adverse reactions Ultrasound x  , 0.8w/cm 2, 20% min to ring finger palm and in with no adverse reactions for edema and scar tissue. Tendon gliding exercises 10 reps each, reverse blocking  x 10 reps , passive stretch to ring finger in composite flexion Wrist flexion/ extension with 2 lbs weight x 15 reps each direction Gripper set at level 4 (55#) for sustained grip to pick up approximately 1/3 blocks Flexbar 10 reps each direction for increased strength Ice pack to LUE x 5 mins for pain and swelling, no adverse reactions.  05/22/23-Paraffin x 10 mins to left hand for pain and stiffness, no adverse reactions Ultrasound x  , 0.8w/cm 2, 20% min to ring finger palm and in with no adverse reactions for edema and scar tissue.  Tendon gliding exercises 10 reps each, finger abduction/ adduction 10 reps  Wrist flexion/ extension 10 reps each with 1 lbs weight  Graded clothespins 1-8# for sustained pinch with middle and ring finger Flexbar x 10 reps  each direction Ice pack to LUE x 5 mins  for pain and swelling, no adverse reactions        05/19/23-Paraffin x 8 mins to left hand for pain and stiffness, no adverse reactions Ultrasound x  , 0.8w/cm 2, 20% min to ring finger palm and in with no adverse reactions for edema and scar tissue.  Velcro roller for key roller and finger roller 3-4 reps each Wrist flexion/ extension with 1 lbs weight  x 10 reps each direction min v.c  Hook fist x 10 reps, reverse blocking x 10 reps  Forearm gym x 4 reps Flexbar red 10 reps each direction   05/15/23 Pt with mild edema present in palm at incision sites and less at wrist today Paraffin x 10 mins to left hand for pain and stiffness, no adverse reactions Ultrasound x  , 0.8w/cm 2, 20%  min to ring finger palm and  with no adverse reactions for edema and scar tissue.  Forearm gym x 4 reps Flexbar, 10 reps each direction red for wrist strength and endurance Tendon gliding exercises,    05/12/23   Pt with mild edema present in palm at incision sites and less at wrist today Paraffin x 10 mins to left hand for pain and stiffness, no adverse reactions Ultrasound x10 min , 0.8w/cm 2, 20% min to ring finger palm incision sites are closed with no adverse reactions for edema and scar tissue.  Tendon gliding exercises, passive wrist extension- prayer position  Forearm gym x 4 reps Flexbar, 10 reps each direction red for wrist strength and endurance Vecro roller for key grip and finger extension 4 reps each Finger extension of each digit on tabletop. Pt reports taking more rest breaks while working, and performing gentle stretch during rest breaks  05/07/23-  Pt with mild edema present in palm at incision sites and wrist. Paraffin x 10 mins to left hand for pain and stiffness, no adverse reactions Ultrasound x10 min , 0.8w/cm 2, 20% min to ring finger palm and incision sites as  incision sites are closed with no adverse reactions for edema and scar tissue.  Tendon gliding exercises, thumb opposition/flex to base of 5th digit, IP flex/ext with MP blocked, passive wrist extension- prayer position and self PROM wrist flex. Yellow  theraputty for grip and pinch, min v.c 10-20 reps each.  Edema improved after [place] and exercises.  Recommended that pt take 2 morning breaks and 2 afternoon breaks while working to perform tendon glides and wrist stretches and use ice pack at lunch and at end of work day due to edema and stiffness     04/30/23- Pt with mild edema present in palm at incision sites and wrist. Paraffin x 10 mins to left hand for pain and stiffness, no adverse reactions [place] , 0.8w/cm 2, 20% to ring finger palm and incision sites as  incision sites are closed.  Tendon gliding exercises, active wrist flexion/ extension, passive wrist extension- prayer position Edema improved after [place] and exercises. Pt was instructed in yellow theraputty HEP for grip and pinch, min v.c 10-20 reps each. Pt was instructed to discontinue if her pain or swelling increases.  04/28/23-US , 0.8w/cm 2, 20% to ring finger palm and incision sites as  incision sites are closed. Paraffin x 8 mins to left hand for pain and stiffness, no adverse reactions Tendon gliding exercises, PIP  and DIP blocking exercises,active wrist flexion/ extension, passive wrist extension- prayer position, and passive wrist flexion, reverse blocking min v.c Pen rolling exercise, twirling pen between fingers and rotating stress balls in L palm for increased LUE functional use, min difficulty/ v.c   04/23/23 [place] , 0.8w/cm 2, 20% to ring finger palm and incision sites as  incision sites are closed. Paraffin x 8 mins to left hand for pain and stiffness. Tendon gliding exercises, PIP blocking exercises,active wrist flexion/ extension, passive wrist extension- prayer position, and passive wrist flexion, reverse blocking min v.c and demonstration  Flipping playing cards, flicking playing cards, dealing playing cards for improved functional use of LUE  04/21/23- Pt with continues edema at palmar incision sites [place] , 0.8w/cm 2, 20% to ring palm and incision sites as   incision sites are closed. Paraffin x 8 mins to left hand for pain and stiffness. Tendon gliding exercises, PIP blocking exercises,, P/ROM to PIP in extension and active wrist flexion/ extension, passive  wrist extension prayer position, min v.c and demonstration  Cupping to  palmar incision for scar mobilization  Gel pads applied under compression glove at incision sites which are now closed      PATIENT EDUCATION:   Education details: see above Person educated: Patient Education method: Programmer, multimedia, Demonstration, Verbal cues, handout Education comprehension: verbalized understanding and returned demonstration  HOME EXERCISE PROGRAM: 04/07/23-tendon gliding Pt has blocking exercises, A/ROM and P/ROM exercises  GOALS: Goals reviewed with patient? Yes  SHORT TERM GOALS: Target date: 05/06/23  I with initial HEP  Goal status:met, 04/23/23  2.  I with scar massage, edema control techniques   Goal status: met, 04/23/23  3.  Pt will demonstrate extensor lag of no mor than -5 for left ring finger. Baseline: -25 Goal status: -5 met 04/22/14  4.  Pt will demonstrate full composite finger flexion for increased ease with ADLs. Baseline: 85% Goal status:  ongoing -98.9%   LONG TERM GOALS: Target date: 06/10/23  I with updated HEP.  Goal status:ongoing 06/02/23  2.  Pt will improve Quick Dash score to 45% or better. Baseline: 52.5% disability Goal status: ongoing,   3.  Pt will demonstrate grip strength of at least 25 lbs for increased functional use.  Goal status: met 50 lbs 06/02/23  4.  Pt will resume use of LUE as a non dominant assist for ADLs/IADLS with pain no greater than 2/10. Baseline: not currently using consistently, pain 4/10 Goal status: ongoing, currently pain 2/10, 06/02/23    ASSESSMENT:  CLINICAL IMPRESSION: Pt is progressing towards all goals with improving strength and flexibility. She demonstrates improving grip strength and decreased swelling in palm  and wrist. Pt continues to have mild limitations in ROM ring finger extension and flexion. Pt sees MD tomorrow. PERFORMANCE DEFICITS: in functional skills including ADLs, IADLs, coordination, dexterity, edema, ROM, strength, pain, fascial restrictions, flexibility, Fine motor control, Gross motor control, endurance, decreased knowledge of precautions, decreased knowledge of use of DME, skin integrity, and UE functional use,  , and psychosocial skills including coping strategies, environmental adaptation, habits, interpersonal interactions, and routines and behaviors.   IMPAIRMENTS: are limiting patient from ADLs, IADLs, rest and sleep, education, work, play, leisure, and social participation.   COMORBIDITIES: may have co-morbidities  that affects occupational performance. Patient will benefit from skilled OT to address above impairments and improve overall function.  MODIFICATION OR ASSISTANCE TO COMPLETE EVALUATION: No modification of tasks or assist necessary to complete an evaluation.  OT OCCUPATIONAL PROFILE AND HISTORY: Detailed assessment: Review of records and additional review of physical, cognitive, psychosocial history related to current functional performance.  CLINICAL DECISION MAKING: LOW - limited treatment options, no task modification necessary  REHAB POTENTIAL: Good  EVALUATION COMPLEXITY: Low      PLAN:  OT FREQUENCY: 2x/week  OT DURATION: 8 weeksplus eval- anticipate d/c after 4-6 weeks  PLANNED INTERVENTIONS: self care/ADL training, therapeutic exercise, therapeutic activity, neuromuscular re-education, manual therapy, scar mobilization, manual lymph drainage, passive range of motion, balance training, splinting, electrical stimulation, ultrasound, paraffin, moist heat, cryotherapy, contrast bath, patient/family education, energy conservation, coping strategies training, DME and/or AE instructions, and Re-evaluation  RECOMMENDED OTHER SERVICES: n/a  CONSULTED AND  AGREED WITH PLAN OF CARE: Patient  PLAN FOR NEXT SESSION:  refer to MD recommendations and proceed accordingly, Paraffin, [place],  address flexibility, continue with light strengthening   Evart Mcdonnell, OTR/L 06/02/2023, 3:58 PM

## 2023-06-03 ENCOUNTER — Encounter: Payer: 59 | Admitting: Physician Assistant

## 2023-06-05 ENCOUNTER — Ambulatory Visit: Payer: 59 | Admitting: Occupational Therapy

## 2023-06-05 DIAGNOSIS — M79642 Pain in left hand: Secondary | ICD-10-CM

## 2023-06-05 DIAGNOSIS — M25642 Stiffness of left hand, not elsewhere classified: Secondary | ICD-10-CM

## 2023-06-05 DIAGNOSIS — M6281 Muscle weakness (generalized): Secondary | ICD-10-CM

## 2023-06-05 NOTE — Therapy (Signed)
OUTPATIENT OCCUPATIONAL THERAPY ORTHO treatment  Patient Name: Alicia Booth MRN: [id number] DOB:26-Mar-1971, 52 y.o., female Today's Date: 06/05/2023  PCP: Dr. Elease Etienne REFERRING PROVIDER: Jari Sportsman PA-C, Dr. Roda Shutters  END OF SESSION:  OT End of Session - 06/05/23 1442     Visit Number 13    Number of Visits 17    Date for OT Re-Evaluation 06/10/23    Authorization Type aetna    Authorization - Number of Visits 60    OT Start Time 1232    OT Stop Time 1315    OT Time Calculation (min) 43 min    Activity Tolerance Patient tolerated treatment well    Behavior During Therapy WFL for tasks assessed/performed                     Past Medical History:  Diagnosis Date   Diabetes (HCC)    High cholesterol    Hypertension    Stress-induced cardiomyopathy    Past Surgical History:  Procedure Laterality Date   BARIATRIC SURGERY  2014   BREAST BIOPSY Left    lymph node bx benign   HEEL SPUR EXCISION     LEFT HEART CATH AND CORONARY ANGIOGRAPHY N/A 04/11/2022   Procedure: LEFT HEART CATH AND CORONARY ANGIOGRAPHY;  Surgeon: Corky Crafts, MD;  Location: [facility];  Service: Cardiovascular;  Laterality: N/A;   MENISCUS REPAIR     tummy tuck     Patient Active Problem List   Diagnosis Date Noted   Seborrheic dermatitis of scalp 05/22/2023   Flexural atopic dermatitis 05/22/2023   Chronic diastolic congestive heart failure (HCC) 01/24/2023   Chronic vaginitis 10/24/2022   Hyperlipidemia associated with type 2 diabetes mellitus (HCC) 10/24/2022   Chronic tension-type headache, not intractable 05/23/2022   Trigger finger, left little finger 05/23/2022   Takotsubo cardiomyopathy 04/24/2022   S/P gastric sleeve procedure 04/24/2022   Adjustment disorder with mixed anxiety and depressed mood 04/24/2022   Perimenopausal symptom 04/24/2022   NSTEMI (non-ST elevated myocardial infarction) (HCC) 04/11/2022   Myopia with astigmatism and presbyopia, bilateral  03/19/2018   Obstructive sleep apnea (adult) (pediatric) 10/13/2012   Diabetes (HCC) 11/16/2009   Hypertension 11/16/2009    ONSET DATE: 03/13/23  REFERRING DIAG:  G56.02 (ICD-10-CM) - Left carpal tunnel syndrome  M65.342 (ICD-10-CM) - Trigger ring finger of left hand    THERAPY DIAG:  Pain in left hand  Stiffness of left hand, not elsewhere classified  Muscle weakness (generalized)  Rationale for Evaluation and Treatment: Rehabilitation  SUBJECTIVE:   SUBJECTIVE STATEMENT:  Pt agrees with placing OT on hold at this time Pt accompanied by: self  PERTINENT HISTORY: status post left carpal tunnel release and left ring finger trigger finger release, date of surgery 03/13/2023. PMH HTN, DM, CHF  PRECAUTIONS: Other: no heavy lifting or submerging hand until 4 weeks post op  WEIGHT BEARING RESTRICTIONS: Yes no heavy lifting   PAIN:  Are you having pain?   2/10  Pain location: hand, wrist  Pain description: aching Aggravating factors: straightening finger Relieving factors: meds  FALLS: Has patient fallen in last 6 months? No  LIVING ENVIRONMENT: Lives with: lives alone   PLOF: Independent  PATIENT GOALS: improve ROM and pain  NEXT MD VISIT: July 10  OBJECTIVE:   HAND DOMINANCE: Right  ADLs: Overall ADLs: mod I with dominant RUE Transfers/ambulation related to ADLs:independent   FUNCTIONAL OUTCOME MEASURES: Quick Dash: 52.5%  UPPER EXTREMITY ROM:   LUE grossly 85 %  composite finger flexion, 90% extension, limited for ring finger, pt opposes all digits  (Blank rows = not tested)  Active ROM Right eval Left eval  Thumb MCP (0-60)    Thumb IP (0-80)    Thumb Radial abd/add (0-55)     Thumb Palmar abd/add (0-45)     Thumb Opposition to Small Finger     Index MCP (0-90)     Index PIP (0-100)     Index DIP (0-70)      Long MCP (0-90)      Long PIP (0-100)      Long DIP (0-70)      Ring MCP (0-90) 80     Ring PIP (0-100)  42flex/ -25 ext    Ring DIP  (0-70)      Little MCP (0-90)      Little PIP (0-100)      Little DIP (0-70)      (Blank rows = not tested) 85% composite finger flexion Opposes all digits HAND FUNCTION: Grip strength: Right: NT lbs; Left: NT lbs- grip not tested due to pain and precautions  COORDINATION: Impaired due to pain and stiffness  SENSATION: WFL  EDEMA: moderate edema in L palm incision sites, Pt with 2 small open stiich sites at lower palm incision site. Top incision site at base of ring finger is fully closed  COGNITION: Overall cognitive status: Within functional limits for tasks assessed   OBSERVATIONS: Pt demonstrates significant pain with ROM of ring finger   TODAY'S TREATMENT: 6/27/24Paraffin x 9 mins to left hand for pain and stiffness, no adverse reactions Ultrasound x  , 0.8w/cm 2, 20% min to ring finger palm and in with no adverse reactions for edema and scar tissue. Tendon gliding exercises 10 reps each,  Wrist flexion/ extension with 2 lbs weight x 15 reps each direction Gripper set at level 4 (55#) for sustained grip to pick up approximately 1/2 blocks min difficulty Red putty was issued and verbally discussed for HEP. Therapist checked progress towards goals, see goals below  06/02/23-Paraffin x 9 mins to left hand for pain and stiffness, no adverse reactions Ultrasound x  , 0.8w/cm 2, 20% min to ring finger palm and in with no adverse reactions for edema and scar tissue. Tendon gliding exercises 10 reps each, reverse blocking  x 10 reps , passive stretch to ring finger in composite flexion Wrist flexion/ extension with 2 lbs weight x 15 reps each direction Gripper set at level 4 (55#) for sustained grip to pick up approximately 1/3 blocks Flexbar 10 reps each direction for increased strength Ice pack to LUE x 5 mins for pain and swelling, no adverse reactions.  05/22/23-Paraffin x 10 mins to left hand for pain and stiffness, no adverse reactions Ultrasound x  , 0.8w/cm 2, 20% min to ring finger palm and in with no adverse reactions for edema and scar tissue.  Tendon gliding exercises 10 reps each, finger abduction/ adduction 10 reps  Wrist flexion/ extension 10 reps each with 1 lbs weight  Graded clothespins 1-8# for sustained pinch with middle and ring finger Flexbar x 10 reps  each direction Ice pack to LUE x 5 mins  for pain and swelling, no adverse reactions        05/19/23-Paraffin x 8 mins to left hand for pain and stiffness, no adverse reactions Ultrasound x  , 0.8w/cm 2, 20% min to ring finger palm and in with no adverse reactions for edema and scar tissue.  Velcro roller for key roller and finger roller 3-4 reps each Wrist flexion/ extension with 1 lbs weight  x 10 reps each direction min v.c  Hook fist x 10 reps, reverse blocking x 10 reps  Forearm gym x 4 reps Flexbar red 10 reps each direction   05/15/23 Pt with mild edema present in palm at incision sites and less at wrist today Paraffin x 10 mins to left hand for pain and stiffness, no adverse reactions Ultrasound x  , 0.8w/cm 2, 20% min to ring finger palm and  with no adverse reactions for edema and scar tissue.  Forearm gym x 4 reps Flexbar, 10 reps each direction red for wrist strength and endurance Tendon gliding exercises,    05/12/23   Pt with mild edema present in palm at incision sites and less at wrist today Paraffin x 10 mins to left hand for pain and stiffness, no adverse reactions Ultrasound x10 min , 0.8w/cm 2, 20% min to ring finger palm incision sites are closed with no adverse reactions for edema and scar tissue.  Tendon gliding exercises, passive wrist extension- prayer position  Forearm gym x 4 reps Flexbar, 10 reps each direction red for wrist strength and endurance Vecro roller for key grip and finger extension 4 reps each Finger extension of each digit on tabletop. Pt reports taking more rest breaks while working, and performing  gentle stretch during rest breaks  05/07/23-  Pt with mild edema present in palm at incision sites and wrist. Paraffin x 10 mins to left hand for pain and stiffness, no adverse reactions Ultrasound x10 min , 0.8w/cm 2, 20% min to ring finger palm and incision sites as  incision sites are closed with no adverse reactions for edema and scar tissue.  Tendon gliding exercises, thumb opposition/flex to base of 5th digit, IP flex/ext with MP blocked, passive wrist extension- prayer position and self PROM wrist flex. Yellow theraputty for grip and pinch, min v.c 10-20 reps each.  Edema improved after [place] and exercises.  Recommended that pt take 2 morning breaks and 2 afternoon breaks while working to perform tendon glides and wrist stretches and use ice pack at lunch and at end of work day due to edema and stiffness     04/30/23- Pt with mild edema present in palm at incision sites and wrist. Paraffin x 10 mins to left hand for pain and stiffness, no adverse reactions [place] , 0.8w/cm 2, 20% to ring finger palm and incision sites as  incision sites are closed.  Tendon gliding exercises, active wrist flexion/ extension, passive wrist extension- prayer position Edema improved after [place] and exercises. Pt was instructed in yellow theraputty HEP for grip and pinch, min v.c 10-20 reps each. Pt was instructed to discontinue if her pain or swelling increases.  04/28/23-US , 0.8w/cm 2, 20% to ring finger palm and incision sites as  incision sites are closed. Paraffin x 8 mins to left hand for pain and stiffness, no adverse reactions Tendon gliding exercises, PIP  and DIP blocking exercises,active wrist flexion/ extension, passive wrist extension- prayer position, and passive wrist flexion, reverse blocking min v.c Pen rolling exercise, twirling pen between fingers and rotating stress balls in L palm for increased LUE functional use, min difficulty/ v.c   04/23/23 [place] , 0.8w/cm 2, 20% to ring finger palm  and incision sites as  incision sites are closed. Paraffin x 8 mins to left hand for pain and stiffness. Tendon gliding exercises, PIP blocking exercises,active wrist  flexion/ extension, passive wrist extension- prayer position, and passive wrist flexion, reverse blocking min v.c and demonstration  Flipping playing cards, flicking playing cards, dealing playing cards for improved functional use of LUE  04/21/23- Pt with continues edema at palmar incision sites [place] , 0.8w/cm 2, 20% to ring palm and incision sites as  incision sites are closed. Paraffin x 8 mins to left hand for pain and stiffness. Tendon gliding exercises, PIP blocking exercises,, P/ROM to PIP in extension and active wrist flexion/ extension, passive wrist extension prayer position, min v.c and demonstration  Cupping to  palmar incision for scar mobilization  Gel pads applied under compression glove at incision sites which are now closed      PATIENT EDUCATION:   Education details: red putty issued for upgraded strengthening Person educated: Patient Education method: Explanation,  Education comprehension: verbalized understanding  HOME EXERCISE PROGRAM: 04/07/23-tendon gliding Pt has blocking exercises, A/ROM and P/ROM exercises  GOALS: Goals reviewed with patient? Yes  SHORT TERM GOALS: Target date: 05/06/23  I with initial HEP  Goal status:met, 04/23/23  2.  I with scar massage, edema control techniques   Goal status: met, 04/23/23  3.  Pt will demonstrate extensor lag of no mor than -5 for left ring finger. Baseline: -25 Goal status: -5 met 04/22/14  4.  Pt will demonstrate full composite finger flexion for increased ease with ADLs. Baseline: 85% Goal status:  partially  met, approximating 99.7%   LONG TERM GOALS: Target date: 06/10/23  I with updated HEP.  Goal status met  06/04/23  2.  Pt will improve Quick Dash score to 45% or better. Baseline: 52.5% disability Goal status: met, 6.8%  disability  3.  Pt will demonstrate grip strength of at least 25 lbs for increased functional use.  Goal status: met 50 lbs 06/02/23  4.  Pt will resume use of LUE as a non dominant assist for ADLs/IADLS with pain no greater than 2/10. Baseline: not currently using consistently, pain 4/10 Goal status: met , pain 2/10 at worst 06/05/23  ASSESSMENT:  CLINICAL IMPRESSION: Pt has met all long term goals and she demonstrates good overall progress. Pt agrees with placing OT on hold at this time. Pt sees MD July 10th and she will call after that visist if she needs additional visits. PERFORMANCE DEFICITS: in functional skills including ADLs, IADLs, coordination, dexterity, edema, ROM, strength, pain, fascial restrictions, flexibility, Fine motor control, Gross motor control, endurance, decreased knowledge of precautions, decreased knowledge of use of DME, skin integrity, and UE functional use,  , and psychosocial skills including coping strategies, environmental adaptation, habits, interpersonal interactions, and routines and behaviors.   IMPAIRMENTS: are limiting patient from ADLs, IADLs, rest and sleep, education, work, play, leisure, and social participation.   COMORBIDITIES: may have co-morbidities  that affects occupational performance. Patient will benefit from skilled OT to address above impairments and improve overall function.  MODIFICATION OR ASSISTANCE TO COMPLETE EVALUATION: No modification of tasks or assist necessary to complete an evaluation.  OT OCCUPATIONAL PROFILE AND HISTORY: Detailed assessment: Review of records and additional review of physical, cognitive, psychosocial history related to current functional performance.  CLINICAL DECISION MAKING: LOW - limited treatment options, no task modification necessary  REHAB POTENTIAL: Good  EVALUATION COMPLEXITY: Low      PLAN:  OT FREQUENCY: 2x/week  OT DURATION: 8 weeksplus eval- anticipate d/c after 4-6 weeks  PLANNED  INTERVENTIONS: self care/ADL training, therapeutic exercise, therapeutic activity, neuromuscular re-education, manual therapy, scar mobilization, manual  lymph drainage, passive range of motion, balance training, splinting, electrical stimulation, ultrasound, paraffin, moist heat, cryotherapy, contrast bath, patient/family education, energy conservation, coping strategies training, DME and/or AE instructions, and Re-evaluation  RECOMMENDED OTHER SERVICES: n/a  CONSULTED AND AGREED WITH PLAN OF CARE: Patient  PLAN FOR NEXT SESSION:  placing  pt. on hold, she will call if additional visits are needed. Will d/c if pt has not returned in 30 days.  Raymona Boss, OTR/L 06/05/2023, 2:42 PM

## 2023-06-09 ENCOUNTER — Ambulatory Visit: Payer: 59 | Admitting: Occupational Therapy

## 2023-06-18 ENCOUNTER — Ambulatory Visit (INDEPENDENT_AMBULATORY_CARE_PROVIDER_SITE_OTHER): Payer: 59 | Admitting: Orthopaedic Surgery

## 2023-06-18 ENCOUNTER — Encounter: Payer: Self-pay | Admitting: Orthopaedic Surgery

## 2023-06-18 DIAGNOSIS — M65342 Trigger finger, left ring finger: Secondary | ICD-10-CM

## 2023-06-18 DIAGNOSIS — Z9889 Other specified postprocedural states: Secondary | ICD-10-CM

## 2023-06-18 NOTE — Progress Notes (Signed)
Post-Op Visit Note   Patient: Alicia Booth           Date of Birth: 1971-01-20           MRN: [id number] Visit Date: 06/18/2023 PCP: Alicia Ng, NP   Assessment & Plan:  Chief Complaint:  Chief Complaint  Patient presents with   Left Wrist - Follow-up    Carpal tunnel release 03/13/2023   Left Hand - Follow-up    Ring TFR 03/13/2023   Visit Diagnoses:  1. S/P carpal tunnel release   2. Trigger finger, left ring finger     Plan: Alicia Booth is a status post left carpal tunnel release and left trigger finger release on 03/13/2023.  She has finished OT.  She feels that she cannot regain full range of motion of the ring finger.  She does not report any significant pain.  She has swelling in the morning that is associated with stiffness as well.  Examination left hand shows fully healed surgical scars.  No neurovascular compromise.  No signs of infection.  She does lack full extension to the PIP joint and full flexion.  She lacks about 5 mm of full flexion of the finger tip to the flexor palmar crease.  There is residual swelling throughout the digit.  Overall I feel that she is recovering well but her swelling is taking a little bit more time to resolve which is limiting her range of motion and function.  I encouraged her to continue with the home exercises while the swelling is resolving.  From my standpoint I can see her back as needed.  Follow-Up Instructions: No follow-ups on file.   Orders:  No orders of the defined types were placed in this encounter.  No orders of the defined types were placed in this encounter.   Imaging: No results found.  PMFS History: Patient Active Problem List   Diagnosis Date Noted   Seborrheic dermatitis of scalp 05/22/2023   Flexural atopic dermatitis 05/22/2023   Chronic diastolic congestive heart failure (HCC) 01/24/2023   Chronic vaginitis 10/24/2022   Hyperlipidemia associated with type 2 diabetes mellitus (HCC) 10/24/2022   Chronic  tension-type headache, not intractable 05/23/2022   Trigger finger, left little finger 05/23/2022   Takotsubo cardiomyopathy 04/24/2022   S/P gastric sleeve procedure 04/24/2022   Adjustment disorder with mixed anxiety and depressed mood 04/24/2022   Perimenopausal symptom 04/24/2022   NSTEMI (non-ST elevated myocardial infarction) (HCC) 04/11/2022   Myopia with astigmatism and presbyopia, bilateral 03/19/2018   Obstructive sleep apnea (adult) (pediatric) 10/13/2012   Diabetes (HCC) 11/16/2009   Hypertension 11/16/2009   Past Medical History:  Diagnosis Date   Diabetes (HCC)    High cholesterol    Hypertension    Stress-induced cardiomyopathy     Family History  Problem Relation Age of Onset   Breast cancer Mother 66   Heart attack Maternal Grandmother 37    Past Surgical History:  Procedure Laterality Date   BARIATRIC SURGERY  2014   BREAST BIOPSY Left    lymph node bx benign   HEEL SPUR EXCISION     LEFT HEART CATH AND CORONARY ANGIOGRAPHY N/A 04/11/2022   Procedure: LEFT HEART CATH AND CORONARY ANGIOGRAPHY;  Surgeon: Corky Crafts, MD;  Location: [facility];  Service: Cardiovascular;  Laterality: N/A;   MENISCUS REPAIR     tummy tuck     Social History   Occupational History   Not on file  Tobacco Use  Smoking status: Never   Smokeless tobacco: Never  Vaping Use   Vaping Use: Never used  Substance and Sexual Activity   Alcohol use: Yes    Comment: occassionally   Drug use: Never   Sexual activity: Yes

## 2023-06-26 ENCOUNTER — Other Ambulatory Visit: Payer: Self-pay | Admitting: Nurse Practitioner

## 2023-07-02 ENCOUNTER — Ambulatory Visit: Payer: 59 | Admitting: Internal Medicine

## 2023-07-02 ENCOUNTER — Encounter: Payer: Self-pay | Admitting: Internal Medicine

## 2023-07-02 VITALS — BP 110/80 | HR 75 | Temp 97.7°F | Ht 67.0 in | Wt 189.6 lb

## 2023-07-02 DIAGNOSIS — J0140 Acute pansinusitis, unspecified: Secondary | ICD-10-CM | POA: Diagnosis not present

## 2023-07-02 MED ORDER — AMOXICILLIN-POT CLAVULANATE 875-125 MG PO TABS: 1.0000 | ORAL_TABLET | Freq: Two times a day (BID) | ORAL | 0 refills | Status: AC

## 2023-07-02 NOTE — Progress Notes (Signed)
[facility] [address] [place] [address] Dept: [phone] Dept Fax: [phone]  Acute Care Office Visit  Subjective:   Alicia Booth 11/20/1971 07/02/2023  Chief Complaint  Patient presents with   Cough    Sore throat, fatigue, liturgic, sinus 3 covid test negative no fever   Started 2 weeks ago Dayquil tylenol sinus flu     HPI: Discussed the use of AI scribe software for clinical note transcription with the patient, who gave verbal consent to proceed.  History of Present Illness   The patient presents with ongoing fatigue and sinus congestion for the past two weeks. The symptoms started after attending a festival, initially presenting as a tickle in the throat, which progressed to laryngitis. The patient also developed a sporadic cough and sinus congestion. Despite using a sinus rinse, the congestion persists. The patient reports no sinus pain but feels constantly tired, needing a nap by mid-afternoon. The patient has taken 3 COVID tests, all which were negative.  The patient also mentions a recent weight loss of eleven pounds over the past year, noticed during a recent weigh-in. The patient is currently on medication for blood pressure and diabetes, and has a history of a NSTEMI. The patient has been feeling sweaty after showers, similar to symptoms experienced post-bariatric surgery when their blood pressure medication had not been adjusted. The patient wonders if the current fatigue could be due to low blood pressure. These symptoms have only been ongoing for the past 2 weeks with her current upper respiratory symptoms. She has not recently checked her BP's at home. She states her BP usually runs 128-130/80's. No recent adjustments or changes in her medications.  She denies CP, SHOB, palpitations.       The following portions of the patient's history were reviewed and updated as appropriate: past medical history,  past surgical history, family history, social history, allergies, medications, and problem list.   Patient Active Problem List   Diagnosis Date Noted   Seborrheic dermatitis of scalp 05/22/2023   Flexural atopic dermatitis 05/22/2023   Chronic diastolic congestive heart failure (HCC) 01/24/2023   Chronic vaginitis 10/24/2022   Hyperlipidemia associated with type 2 diabetes mellitus (HCC) 10/24/2022   Chronic tension-type headache, not intractable 05/23/2022   Trigger finger, left little finger 05/23/2022   Takotsubo cardiomyopathy 04/24/2022   S/P gastric sleeve procedure 04/24/2022   Adjustment disorder with mixed anxiety and depressed mood 04/24/2022   Perimenopausal symptom 04/24/2022   NSTEMI (non-ST elevated myocardial infarction) (HCC) 04/11/2022   Myopia with astigmatism and presbyopia, bilateral 03/19/2018   Obstructive sleep apnea (adult) (pediatric) 10/13/2012   Diabetes (HCC) 11/16/2009   Hypertension 11/16/2009   Past Medical History:  Diagnosis Date   Diabetes (HCC)    High cholesterol    Hypertension    Stress-induced cardiomyopathy    Past Surgical History:  Procedure Laterality Date   BARIATRIC SURGERY  2014   BREAST BIOPSY Left    lymph node bx benign   HEEL SPUR EXCISION     LEFT HEART CATH AND CORONARY ANGIOGRAPHY N/A 04/11/2022   Procedure: LEFT HEART CATH AND CORONARY ANGIOGRAPHY;  Surgeon: Corky Crafts, MD;  Location: [facility];  Service: Cardiovascular;  Laterality: N/A;   MENISCUS REPAIR     tummy tuck     Family History  Problem Relation Age of Onset   Breast cancer Mother 4   Heart attack Maternal Grandmother 63   Outpatient Medications Prior  to Visit  Medication Sig Dispense Refill   cyclobenzaprine (FLEXERIL) 5 MG tablet Take 1 tablet (5 mg total) by mouth at bedtime as needed for muscle spasms. 30 tablet 0   fluticasone (FLONASE) 50 MCG/ACT nasal spray Place into both nostrils as needed for allergies or rhinitis.      ketoconazole (NIZORAL) 2 % shampoo Apply 1 Application topically 2 (two) times a week. 120 mL 0   lisinopril (ZESTRIL) 40 MG tablet TAKE ONE TABLET BY MOUTH AT BEDTIME 90 tablet 0   metoprolol succinate (TOPROL-XL) 100 MG 24 hr tablet TAKE ONE TABLET BY MOUTH ONE TIME DAILY TAKE WITH OR IMMEDIATELY FOLLOWING A MEAL 90 tablet 2   Multiple Vitamins-Minerals (ONE-A-DAY 50 PLUS PO) Take 1 tablet by mouth at bedtime.     nitroGLYCERIN (NITROSTAT) 0.4 MG SL tablet Place 1 tablet (0.4 mg total) under the tongue every 5 (five) minutes x 3 doses as needed for chest pain. 25 tablet 12   rosuvastatin (CRESTOR) 40 MG tablet TAKE ONE TABLET BY MOUTH ONE TIME DAILY 90 tablet 2   Semaglutide, 1 MG/DOSE, (OZEMPIC, 1 MG/DOSE,) 4 MG/3ML SOPN Inject 1 mg into the skin once a week. 3 mL 2   spironolactone (ALDACTONE) 25 MG tablet TAKE ONE-HALF TABLET BY MOUTH ONE TIME DAILY 135 tablet 0   triamcinolone cream (KENALOG) 0.1 % Apply 1 Application topically 2 (two) times daily. 80 g 0   venlafaxine XR (EFFEXOR XR) 37.5 MG 24 hr capsule Take 1 capsule (37.5 mg total) by mouth daily with breakfast. 30 capsule 5   HYDROcodone-acetaminophen (NORCO) 5-325 MG tablet Take 2 tablets by mouth 3 (three) times daily as needed. To be taken after surgery (Patient not taking: Reported on 07/02/2023) 15 tablet 0   naproxen (NAPROSYN) 500 MG tablet Take one time bid x 2 weeks and then prn (Patient not taking: Reported on 07/02/2023) 60 tablet 2   traMADol (ULTRAM) 50 MG tablet Take 1 tablet (50 mg total) by mouth every 6 (six) hours as needed. (Patient not taking: Reported on 07/02/2023) 30 tablet 2   No facility-administered medications prior to visit.   Allergies  Allergen Reactions   Lipitor [Atorvastatin] Itching     ROS: A complete ROS was performed with pertinent positives/negatives noted in the HPI. The remainder of the ROS are negative.    Objective:   Today's Vitals   07/02/23 0929  BP: 110/80  Pulse: 75  Temp: 97.7 F  (36.5 C)  TempSrc: Temporal  SpO2: 99%  Weight: 189 lb 9.6 oz (86 kg)  Height: 5\' 7"  (1.702 m)    GENERAL: Well-appearing, in NAD. Well nourished.  SKIN: Pink, warm and dry. No rash, lesion, ulceration.  HEENT:    HEAD: Normocephalic, non-traumatic.  EYES: Conjunctive pink without exudate. PERRL, EOMI.  EARS: External ear w/o redness, swelling, masses, or lesions. EAC clear. TM's intact, translucent w/o bulging, appropriate landmarks visualized. Moderate amount of cerumen present bilaterally  NOSE: Septum midline w/o deformity. Nares patent, turbinates swollen and inflamed.  No sinus tenderness.  THROAT: Uvula midline. Oropharynx clear. Tonsils absent.  Mucus membranes pink and moist.  NECK: Trachea midline. Full ROM w/o pain or tenderness. No lymphadenopathy.  RESPIRATORY: Chest wall symmetrical. Respirations even and non-labored. Breath sounds clear to auscultation bilaterally.  CARDIAC: S1, S2 present, regular rate and rhythm. Peripheral pulses 2+ bilaterally.  EXTREMITIES: Without clubbing, cyanosis, or edema.  NEUROLOGIC: No motor or sensory deficits. Steady, even gait.  PSYCH/MENTAL STATUS: Alert, oriented x 3. Cooperative,  appropriate mood and affect.    No results found for any visits on 07/02/23.    Assessment & Plan:  Assessment and Plan    Acute Sinusitis:  -Start Augmentin 1 tablet BID for 7 days. -Continue sinus rinses. -Start Flonase nasal spray 1 spray each nostril BID.  -fatigue could be related to recent sinusitis, as fatigue developed same time as other sinus symptoms.  - Monitor blood pressure at home -If consistently below 110/80, consider reducing Lisinopril from 40mg  to 20mg . -Return for evaluation if symptoms persist after treating the infection or if new symptoms such as chest pain or shortness of breath develop.     Meds ordered this encounter  Medications   amoxicillin-clavulanate (AUGMENTIN) 875-125 MG tablet    Sig: Take 1 tablet by mouth 2 (two)  times daily for 7 days.    Dispense:  14 tablet    Refill:  0    Order Specific Question:   Supervising Provider    Answer:   Garnette Gunner [[id number]]   Lab Orders  No laboratory test(s) ordered today   No images are attached to the encounter or orders placed in the encounter.  Return if symptoms worsen or fail to improve.   Of note, portions of this note may have been created with voice recognition software Physicist, medical). While this note has been edited for accuracy, occasional wrong-word or 'sound-a-like' substitutions may have occurred due to the inherent limitations of voice recognition software.  Salvatore Decent, FNP

## 2023-07-02 NOTE — Patient Instructions (Signed)
Check BP at home , notify PCP if below 110/80   Continue nasal lavage Do Flonase nasal spray 2 times a day  Can take tylenol as needed for pain

## 2023-07-09 ENCOUNTER — Encounter (INDEPENDENT_AMBULATORY_CARE_PROVIDER_SITE_OTHER): Payer: Self-pay

## 2023-07-10 ENCOUNTER — Encounter: Payer: Self-pay | Admitting: Occupational Therapy

## 2023-07-10 NOTE — Therapy (Signed)
[facility] at [facility] [address] [facility] [place], [place], [address] Phone: [phone]   Fax:  [phone]  Patient Details  Name: Alicia Booth MRN: [id number] Date of Birth: [birth year] Referring Provider: Dr. Roda Shutters OCCUPATIONAL THERAPY DISCHARGE SUMMARY  Visits from Start of Care: 13  Current functional level related to goals / functional outcomes: Pt made excellent overall progress.Pt met all long and 3/4 short term goals   Remaining deficits: Mildly decreased ROM, edema   Education / Equipment: Pt was instructed in HEP. She demonstrates understanding of all education.  Patient agrees to discharge. Patient goals were partially met. Patient is being discharged due to being pleased with the current functional level. Pt saw MD and no additional OT visits were needed. Pt has transitioned to HEP..     Encounter Date: 07/10/2023 SHORT TERM GOALS:    I with initial HEP   Goal status:met, 04/23/23   2.  I with scar massage, edema control techniques   Goal status: met, 04/23/23   3.  Pt will demonstrate extensor lag of no mor than -5 for left ring finger. Baseline: -25 Goal status: -5 met 04/22/14   4.  Pt will demonstrate full composite finger flexion for increased ease with ADLs. Baseline: 85% Goal status:  partially  met, approximating 99.7%     LONG TERM GOALS:    I with updated HEP.  Goal status met  06/04/23   2.  Pt will improve Quick Dash score to 45% or better. Baseline: 52.5% disability Goal status: met, 6.8% disability   3.  Pt will demonstrate grip strength of at least 25 lbs for increased functional use.   Goal status: met 50 lbs 06/02/23   4.  Pt will resume use of LUE as a non dominant assist for ADLs/IADLS with pain no greater than 2/10. Baseline: not currently using consistently, pain 4/10 Goal status: met , pain 2/10 at worst 06/05/23          Florencio Hollibaugh, OT 07/10/2023, 12:22 PM   [facility]  [facility] Outpatient Rehabilitation at [facility] [address] [facility] [place], [place], [address] Phone: [phone]   Fax:  [phone]

## 2023-07-12 ENCOUNTER — Encounter: Payer: Self-pay | Admitting: Internal Medicine

## 2023-07-14 NOTE — Telephone Encounter (Signed)
Pt called and is very uncomfortable. She always gets a yeast infection after antibiotic and would like to have a pill sent in for that.

## 2023-07-15 ENCOUNTER — Telehealth: Payer: Self-pay | Admitting: Nurse Practitioner

## 2023-07-15 ENCOUNTER — Other Ambulatory Visit: Payer: Self-pay | Admitting: Internal Medicine

## 2023-07-15 DIAGNOSIS — B3731 Acute candidiasis of vulva and vagina: Secondary | ICD-10-CM

## 2023-07-15 MED ORDER — FLUCONAZOLE 150 MG PO TABS: ORAL_TABLET | ORAL | 0 refills | Status: DC

## 2023-07-15 NOTE — Telephone Encounter (Signed)
Prescription Request  07/15/2023  LOV: 01/24/2023  What is the name of the medication or equipment? fluconazole (DIFLUCAN) 150 MG   Have you contacted your pharmacy to request a refill? Yes   Which pharmacy would you like this sent to?  [facility] [address] [place], [place] - [address] AT [facility] [address] [place]. [place] [address] Phone: [phone] Fax: [phone]    Patient notified that their request is being sent to the clinical staff for review and that they should receive a response within 2 business days.   Please advise at Mobile [phone] (mobile)   Pt said please give her atleast 2 pills and give her a call when it is sent in

## 2023-07-15 NOTE — Telephone Encounter (Signed)
Rx sent in for patient. thanks

## 2023-07-16 ENCOUNTER — Other Ambulatory Visit: Payer: Self-pay | Admitting: Nurse Practitioner

## 2023-07-16 NOTE — Telephone Encounter (Signed)
I sent this in for patient yesterday

## 2023-07-17 NOTE — Telephone Encounter (Signed)
Patient stated she pick up Rx and is feeling much better

## 2023-07-25 ENCOUNTER — Ambulatory Visit: Payer: 59 | Admitting: Nurse Practitioner

## 2023-07-25 ENCOUNTER — Encounter: Payer: Self-pay | Admitting: Nurse Practitioner

## 2023-07-25 ENCOUNTER — Other Ambulatory Visit (HOSPITAL_COMMUNITY)
Admission: RE | Admit: 2023-07-25 | Discharge: 2023-07-25 | Disposition: A | Discharge status: Home / Self Care | Payer: 59 | Source: Ambulatory Visit | Attending: Nurse Practitioner | Admitting: Nurse Practitioner

## 2023-07-25 VITALS — BP 110/78 | HR 71 | Temp 97.7°F | Resp 16 | Ht 67.0 in | Wt 189.2 lb

## 2023-07-25 DIAGNOSIS — E785 Hyperlipidemia, unspecified: Secondary | ICD-10-CM | POA: Diagnosis not present

## 2023-07-25 DIAGNOSIS — E1169 Type 2 diabetes mellitus with other specified complication: Secondary | ICD-10-CM

## 2023-07-25 DIAGNOSIS — N951 Menopausal and female climacteric states: Secondary | ICD-10-CM

## 2023-07-25 DIAGNOSIS — R5383 Other fatigue: Secondary | ICD-10-CM | POA: Diagnosis not present

## 2023-07-25 DIAGNOSIS — E559 Vitamin D deficiency, unspecified: Secondary | ICD-10-CM | POA: Diagnosis not present

## 2023-07-25 DIAGNOSIS — Z7985 Long-term (current) use of injectable non-insulin antidiabetic drugs: Secondary | ICD-10-CM

## 2023-07-25 DIAGNOSIS — Z1159 Encounter for screening for other viral diseases: Secondary | ICD-10-CM | POA: Diagnosis not present

## 2023-07-25 LAB — LIPID PANEL
Cholesterol: 125 mg/dL (ref 0–200)
HDL: 36.8 mg/dL — ABNORMAL LOW (ref >39.00)
LDL Cholesterol: 59 mg/dL (ref 0–99)
NonHDL: 88.52
Total CHOL/HDL Ratio: 3
Triglycerides: 148 mg/dL (ref 0.0–149.0)
VLDL: 29.6 mg/dL (ref 0.0–40.0)

## 2023-07-25 LAB — RENAL FUNCTION PANEL
Albumin: 4.6 g/dL (ref 3.5–5.2)
BUN: 9 mg/dL (ref 6–23)
CO2: 32 mEq/L (ref 19–32)
Calcium: 10 mg/dL (ref 8.4–10.5)
Chloride: 97 mEq/L (ref 96–112)
Creatinine, Ser: 0.7 mg/dL (ref 0.40–1.20)
GFR: 98.15 mL/min (ref >60.00)
Glucose, Bld: 92 mg/dL (ref 70–99)
Phosphorus: 3.9 mg/dL (ref 2.3–4.6)
Potassium: 4.3 mEq/L (ref 3.5–5.1)
Sodium: 135 mEq/L (ref 135–145)

## 2023-07-25 LAB — MICROALBUMIN / CREATININE URINE RATIO
Creatinine,U: 138.3 mg/dL
Microalb Creat Ratio: 0.5 mg/g (ref 0.0–30.0)
Microalb, Ur: <0.7 mg/dL (ref 0.0–1.9)

## 2023-07-25 LAB — TSH: TSH: 0.99 u[IU]/mL (ref 0.35–5.50)

## 2023-07-25 LAB — VITAMIN D 25 HYDROXY (VIT D DEFICIENCY, FRACTURES): VITD: 36.82 ng/mL (ref 30.00–100.00)

## 2023-07-25 LAB — HEMOGLOBIN A1C: Hgb A1c MFr Bld: 6.2 % (ref 4.6–6.5)

## 2023-07-25 MED ORDER — VEOZAH 45 MG PO TABS: 1.0000 | ORAL_TABLET | Freq: Every day | ORAL | 5 refills | Status: DC

## 2023-07-25 NOTE — Assessment & Plan Note (Signed)
Controlled Dm with ozempic 1mg  Repeat hgbA1c and UACr Maintain med dose F/up in 6months

## 2023-07-25 NOTE — Assessment & Plan Note (Addendum)
Onset 3months ago Fatigue and intermittent dizziness, worse with rapid head movement. No syncope, no palpitation, no SOB, no CP No tinnitus, no hx of anemia CBC    Component Value Date/Time   WBC 9.0 10/24/2022 1111   RBC 5.00 10/24/2022 1111   HGB 14.1 10/24/2022 1111   HCT 43.1 10/24/2022 1111   PLT 255.0 10/24/2022 1111   MCV 86.2 10/24/2022 1111   MCH 28.0 04/11/2022 1016   MCHC 32.7 10/24/2022 1111   RDW 14.4 10/24/2022 1111   LYMPHSABS 2.2 12/29/2021 1016   MONOABS 0.8 12/29/2021 1016   EOSABS 0.2 12/29/2021 1016   BASOSABS 0.1 12/29/2021 1016  She thinks this might be related to low BP readings: BP reading at home: 111/81, 124/80, 118/75, 110/67, 127/80, 106/76, 118/78. HR: 74, 75, 67, 69, 74, 79, 74, 82 H2o intake: about 16oz daily Admits to sedentary lifestyle.  Advised to maintain 60-64oz of water daily Check THYROID, vit. D, bmp

## 2023-07-25 NOTE — Progress Notes (Signed)
Established Patient Visit  Patient: Alicia Booth   DOB: 1971/10/10   52 y.o. Female  MRN: [id number] Visit Date: 07/25/2023  Subjective:    Chief Complaint  Patient presents with   Medical Management of Chronic Issues    Fasting    HPI Diabetes (HCC) Controlled Dm with ozempic 1mg  Repeat hgbA1c and UACr Maintain med dose F/up in 6months  Perimenopausal symptom No improvement with effexor 37.5mg  Worse with menstrual cycle. No interested in HRT. We discussed use of veozah and possible side effects. She agreed to start med. Maintain effexor dose Return to lab for repeat hepatic panel in 3months Increase Effexor dose if unable to afford veozah     Latest Ref Rng & Units 10/24/2022   11:11 AM 04/12/2022    2:22 AM  Hepatic Function  Total Protein 6.0 - 8.3 g/dL 7.4  6.5   Albumin 3.5 - 5.2 g/dL 4.3  3.7   AST 0 - 37 U/L 16  23   ALT 0 - 35 U/L 14  23   Alk Phosphatase 39 - 117 U/L 53  52   Total Bilirubin 0.2 - 1.2 mg/dL 0.6  0.7      Hyperlipidemia associated with type 2 diabetes mellitus (HCC) Repeat lipid panel Maintain crestor dose  Other fatigue Onset 3months ago Fatigue and intermittent dizziness, worse with rapid head movement. No syncope, no palpitation, no SOB, no CP No tinnitus, no hx of anemia CBC    Component Value Date/Time   WBC 9.0 10/24/2022 1111   RBC 5.00 10/24/2022 1111   HGB 14.1 10/24/2022 1111   HCT 43.1 10/24/2022 1111   PLT 255.0 10/24/2022 1111   MCV 86.2 10/24/2022 1111   MCH 28.0 04/11/2022 1016   MCHC 32.7 10/24/2022 1111   RDW 14.4 10/24/2022 1111   LYMPHSABS 2.2 12/29/2021 1016   MONOABS 0.8 12/29/2021 1016   EOSABS 0.2 12/29/2021 1016   BASOSABS 0.1 12/29/2021 1016  She thinks this might be related to low BP readings: BP reading at home: 111/81, 124/80, 118/75, 110/67, 127/80, 106/76, 118/78. HR: 74, 75, 67, 69, 74, 79, 74, 82 H2o intake: about 16oz daily Admits to sedentary lifestyle.  Advised to maintain  60-64oz of water daily Check THYROID, vit. D, bmp  BP Readings from Last 3 Encounters:  07/25/23 110/78  07/02/23 110/80  01/24/23 128/80     Wt Readings from Last 3 Encounters:  07/25/23 189 lb 3.2 oz (85.8 kg)  07/02/23 189 lb 9.6 oz (86 kg)  01/24/23 194 lb 12.8 oz (88.4 kg)    Reviewed medical, surgical, and social history today  Medications: Outpatient Medications Prior to Visit  Medication Sig   fluticasone (FLONASE) 50 MCG/ACT nasal spray Place into both nostrils as needed for allergies or rhinitis.   ketoconazole (NIZORAL) 2 % shampoo Apply 1 Application topically 2 (two) times a week.   lisinopril (ZESTRIL) 40 MG tablet TAKE ONE TABLET BY MOUTH AT BEDTIME   metoprolol succinate (TOPROL-XL) 100 MG 24 hr tablet TAKE ONE TABLET BY MOUTH ONE TIME DAILY TAKE WITH OR IMMEDIATELY FOLLOWING A MEAL   Multiple Vitamins-Minerals (ONE-A-DAY 50 PLUS PO) Take 1 tablet by mouth at bedtime.   naproxen (NAPROSYN) 500 MG tablet Take one time bid x 2 weeks and then prn   nitroGLYCERIN (NITROSTAT) 0.4 MG SL tablet Place 1 tablet (0.4 mg total) under the tongue every 5 (five) minutes x 3  doses as needed for chest pain.   rosuvastatin (CRESTOR) 40 MG tablet TAKE ONE TABLET BY MOUTH ONE TIME DAILY   Semaglutide, 1 MG/DOSE, (OZEMPIC, 1 MG/DOSE,) 4 MG/3ML SOPN Inject 1 mg into the skin once a week.   spironolactone (ALDACTONE) 25 MG tablet TAKE ONE-HALF TABLET BY MOUTH ONE TIME DAILY   triamcinolone cream (KENALOG) 0.1 % Apply 1 Application topically 2 (two) times daily.   venlafaxine XR (EFFEXOR XR) 37.5 MG 24 hr capsule Take 1 capsule (37.5 mg total) by mouth daily with breakfast.   [DISCONTINUED] cyclobenzaprine (FLEXERIL) 5 MG tablet Take 1 tablet (5 mg total) by mouth at bedtime as needed for muscle spasms. (Patient not taking: Reported on 07/25/2023)   [DISCONTINUED] fluconazole (DIFLUCAN) 150 MG tablet Take 1 tablet by mouth once. Repeat dose in 3 days if symptoms persist. (Patient not taking:  Reported on 07/25/2023)   [DISCONTINUED] HYDROcodone-acetaminophen (NORCO) 5-325 MG tablet Take 2 tablets by mouth 3 (three) times daily as needed. To be taken after surgery (Patient not taking: Reported on 07/02/2023)   [DISCONTINUED] traMADol (ULTRAM) 50 MG tablet Take 1 tablet (50 mg total) by mouth every 6 (six) hours as needed. (Patient not taking: Reported on 07/02/2023)   No facility-administered medications prior to visit.   Reviewed past medical and social history.   ROS per HPI above      Objective:  BP 110/78 (BP Location: Left Arm, Patient Position: Sitting, Cuff Size: Normal)   Pulse 71   Temp 97.7 F (36.5 C) (Temporal)   Resp 16   Ht 5\' 7"  (1.702 m)   Wt 189 lb 3.2 oz (85.8 kg)   LMP 06/14/2023   SpO2 98%   BMI 29.63 kg/m      Physical Exam Vitals and nursing note reviewed.  Cardiovascular:     Rate and Rhythm: Normal rate and regular rhythm.     Pulses: Normal pulses.     Heart sounds: Normal heart sounds.  Pulmonary:     Effort: Pulmonary effort is normal.     Breath sounds: Normal breath sounds.  Neurological:     Mental Status: She is alert and oriented to person, place, and time.     No results found for any visits on 07/25/23.    Assessment & Plan:    Problem List Items Addressed This Visit     Diabetes (HCC)    Controlled Dm with ozempic 1mg  Repeat hgbA1c and UACr Maintain med dose F/up in 6months      Relevant Orders   Hemoglobin A1c   Renal Function Panel   Microalbumin / creatinine urine ratio   Hyperlipidemia associated with type 2 diabetes mellitus (HCC) - Primary    Repeat lipid panel Maintain crestor dose      Relevant Orders   Lipid panel   Other fatigue    Onset 3months ago Fatigue and intermittent dizziness, worse with rapid head movement. No syncope, no palpitation, no SOB, no CP No tinnitus, no hx of anemia CBC    Component Value Date/Time   WBC 9.0 10/24/2022 1111   RBC 5.00 10/24/2022 1111   HGB 14.1 10/24/2022  1111   HCT 43.1 10/24/2022 1111   PLT 255.0 10/24/2022 1111   MCV 86.2 10/24/2022 1111   MCH 28.0 04/11/2022 1016   MCHC 32.7 10/24/2022 1111   RDW 14.4 10/24/2022 1111   LYMPHSABS 2.2 12/29/2021 1016   MONOABS 0.8 12/29/2021 1016   EOSABS 0.2 12/29/2021 1016   BASOSABS 0.1 12/29/2021 1016  She thinks this might be related to low BP readings: BP reading at home: 111/81, 124/80, 118/75, 110/67, 127/80, 106/76, 118/78. HR: 74, 75, 67, 69, 74, 79, 74, 82 H2o intake: about 16oz daily Admits to sedentary lifestyle.  Advised to maintain 60-64oz of water daily Check THYROID, vit. D, bmp      Relevant Orders   TSH   Perimenopausal symptom    No improvement with effexor 37.5mg  Worse with menstrual cycle. No interested in HRT. We discussed use of veozah and possible side effects. She agreed to start med. Maintain effexor dose Return to lab for repeat hepatic panel in 3months Increase Effexor dose if unable to afford veozah     Latest Ref Rng & Units 10/24/2022   11:11 AM 04/12/2022    2:22 AM  Hepatic Function  Total Protein 6.0 - 8.3 g/dL 7.4  6.5   Albumin 3.5 - 5.2 g/dL 4.3  3.7   AST 0 - 37 U/L 16  23   ALT 0 - 35 U/L 14  23   Alk Phosphatase 39 - 117 U/L 53  52   Total Bilirubin 0.2 - 1.2 mg/dL 0.6  0.7          Relevant Medications   Fezolinetant (VEOZAH) 45 MG TABS   Other Relevant Orders   Hepatic function panel   Other Visit Diagnoses     Vitamin D deficiency       Relevant Orders   VITAMIN D 25 Hydroxy (Vit-D Deficiency, Fractures)   Encounter for screening for viral disease       Relevant Orders   HIV Antibody (routine testing w rflx)   RPR   Urine cytology ancillary only   Hepatitis C antibody      Return in about 6 months (around 01/25/2024) for DM, HTN, hyperlipidemia (fasting).     Alysia Penna, NP

## 2023-07-25 NOTE — Assessment & Plan Note (Signed)
Repeat lipid panel. ?Maintain crestor dose ?

## 2023-07-25 NOTE — Assessment & Plan Note (Addendum)
No improvement with effexor 37.5mg  Worse with menstrual cycle. No interested in HRT. We discussed use of veozah and possible side effects. She agreed to start med. Maintain effexor dose Return to lab for repeat hepatic panel in 3months Increase Effexor dose if unable to afford veozah     Latest Ref Rng & Units 10/24/2022   11:11 AM 04/12/2022    2:22 AM  Hepatic Function  Total Protein 6.0 - 8.3 g/dL 7.4  6.5   Albumin 3.5 - 5.2 g/dL 4.3  3.7   AST 0 - 37 U/L 16  23   ALT 0 - 35 U/L 14  23   Alk Phosphatase 39 - 117 U/L 53  52   Total Bilirubin 0.2 - 1.2 mg/dL 0.6  0.7

## 2023-07-25 NOTE — Patient Instructions (Signed)
Go to lab Continue Heart healthy diet and daily exercise. Maintain current medications. 

## 2023-07-28 LAB — URINE CYTOLOGY ANCILLARY ONLY
Chlamydia: NEGATIVE
Comment: NEGATIVE
Comment: NEGATIVE
Comment: NORMAL
Neisseria Gonorrhea: NEGATIVE
Trichomonas: NEGATIVE

## 2023-07-29 LAB — RPR: RPR Ser Ql: NONREACTIVE

## 2023-07-29 LAB — HEPATITIS C ANTIBODY: Hepatitis C Ab: NONREACTIVE

## 2023-07-29 LAB — HIV ANTIBODY (ROUTINE TESTING W REFLEX): HIV 1&2 Ab, 4th Generation: NONREACTIVE

## 2023-07-29 NOTE — Progress Notes (Signed)
Stable Follow instructions as discussed during office visit.

## 2023-08-02 ENCOUNTER — Other Ambulatory Visit: Payer: Self-pay | Admitting: Nurse Practitioner

## 2023-08-02 DIAGNOSIS — E1165 Type 2 diabetes mellitus with hyperglycemia: Secondary | ICD-10-CM

## 2023-08-02 DIAGNOSIS — N951 Menopausal and female climacteric states: Secondary | ICD-10-CM

## 2023-09-03 ENCOUNTER — Encounter: Payer: Self-pay | Admitting: Orthopaedic Surgery

## 2023-09-03 ENCOUNTER — Other Ambulatory Visit (INDEPENDENT_AMBULATORY_CARE_PROVIDER_SITE_OTHER): Payer: 59

## 2023-09-03 ENCOUNTER — Ambulatory Visit: Payer: 59 | Admitting: Orthopaedic Surgery

## 2023-09-03 DIAGNOSIS — M25562 Pain in left knee: Secondary | ICD-10-CM | POA: Diagnosis not present

## 2023-09-03 DIAGNOSIS — G8929 Other chronic pain: Secondary | ICD-10-CM

## 2023-09-03 DIAGNOSIS — M1712 Unilateral primary osteoarthritis, left knee: Secondary | ICD-10-CM

## 2023-09-03 NOTE — Progress Notes (Signed)
Office Visit Note   Patient: Alicia Booth           Date of Birth: 1971-07-25           MRN: [id number] Visit Date: 09/03/2023              Requested by: Anne Ng, NP [address] [place],  [place] [address] PCP: Anne Ng, NP   Assessment & Plan: Visit Diagnoses:  1. Unilateral primary osteoarthritis, left knee     Plan: Impression is left knee moderate osteoarthritis and medial and lateral meniscal tears.  Patient reporting symptoms related to the meniscal tears.  I reviewed the MRI of the left knee from 2022 which was obtained in [place].  Based on the interpretation I have recommended arthroscopic medial lateral meniscal debridement and chondroplasty as indicated.  Feel that the chondromalacia is not advanced enough to require a total knee replacement at this time.  She would like to have this done sometime in December.  Eunice Blase will call her to confirm surgery time.  Total face to face encounter time was greater than 25 minutes and over half of this time was spent in counseling and/or coordination of care.  Follow-Up Instructions: No follow-ups on file.   Orders:  Orders Placed This Encounter  Procedures   XR KNEE 3 VIEW LEFT   No orders of the defined types were placed in this encounter.     Procedures: No procedures performed   Clinical Data: No additional findings.   Subjective: Chief Complaint  Patient presents with   Left Knee - Pain    HPI patient is a pleasant 52 year old female who comes in today with left knee pain.  Symptoms initially started about 2 years ago and have recently worsened after a move.  The pain she has is primarily to the medial aspect.  She notes that this is a constant throb worse lying in the bed as well as with stair climbing.  She denies any locking or catching.  She has recently tried naproxen with some relief.  No previous cortisone injection to the left knee.  She does note a remote left knee arthroscopy  in 2012 for meniscal tear.  Review of Systems as detailed in HPI.  All others reviewed and are negative.   Objective: Vital Signs: There were no vitals taken for this visit.  Physical Exam well-developed well-nourished female in no acute distress.  Alert and oriented x 3.  Ortho Exam left knee exam: Small effusion.  Range of motion 0 to 115 degrees.  She does have medial joint line tenderness.  Marked Tello femoral crepitus.  She is neurovascularly intact distally.  Specialty Comments:  No specialty comments available.  Imaging: XR KNEE 3 VIEW LEFT  Result Date: 09/03/2023 X-rays demonstrate moderate tricompartmental degenerative changes    PMFS History: Patient Active Problem List   Diagnosis Date Noted   Other fatigue 07/25/2023   Seborrheic dermatitis of scalp 05/22/2023   Flexural atopic dermatitis 05/22/2023   Chronic diastolic congestive heart failure (HCC) 01/24/2023   Chronic vaginitis 10/24/2022   Hyperlipidemia associated with type 2 diabetes mellitus (HCC) 10/24/2022   Chronic tension-type headache, not intractable 05/23/2022   Trigger finger, left little finger 05/23/2022   Takotsubo cardiomyopathy 04/24/2022   S/P gastric sleeve procedure 04/24/2022   Adjustment disorder with mixed anxiety and depressed mood 04/24/2022   Perimenopausal symptom 04/24/2022   NSTEMI (non-ST elevated myocardial infarction) (HCC) 04/11/2022   Myopia with astigmatism and presbyopia,  bilateral 03/19/2018   Obstructive sleep apnea (adult) (pediatric) 10/13/2012   Diabetes (HCC) 11/16/2009   Hypertension 11/16/2009   Past Medical History:  Diagnosis Date   Diabetes (HCC)    High cholesterol    Hypertension    Stress-induced cardiomyopathy     Family History  Problem Relation Age of Onset   Breast cancer Mother 43   Heart attack Maternal Grandmother 8    Past Surgical History:  Procedure Laterality Date   BARIATRIC SURGERY  2014   BREAST BIOPSY Left    lymph node bx  benign   HEEL SPUR EXCISION     LEFT HEART CATH AND CORONARY ANGIOGRAPHY N/A 04/11/2022   Procedure: LEFT HEART CATH AND CORONARY ANGIOGRAPHY;  Surgeon: Corky Crafts, MD;  Location: [facility];  Service: Cardiovascular;  Laterality: N/A;   MENISCUS REPAIR     tummy tuck     Social History   Occupational History   Not on file  Tobacco Use   Smoking status: Never   Smokeless tobacco: Never  Vaping Use   Vaping status: Never Used  Substance and Sexual Activity   Alcohol use: Yes    Comment: occassionally   Drug use: Never   Sexual activity: Yes

## 2023-09-06 ENCOUNTER — Encounter: Payer: Self-pay | Admitting: Nurse Practitioner

## 2023-09-06 DIAGNOSIS — L2089 Other atopic dermatitis: Secondary | ICD-10-CM

## 2023-09-11 MED ORDER — TRIAMCINOLONE ACETONIDE 0.1 % EX CREA: 1.0000 | TOPICAL_CREAM | Freq: Two times a day (BID) | CUTANEOUS | 1 refills | Status: DC

## 2023-09-11 MED ORDER — TRIAMCINOLONE ACETONIDE 0.1 % EX OINT: 1.0000 | TOPICAL_OINTMENT | Freq: Two times a day (BID) | CUTANEOUS | 0 refills | Status: AC

## 2023-09-29 ENCOUNTER — Other Ambulatory Visit: Payer: Self-pay | Admitting: Physician Assistant

## 2023-10-15 ENCOUNTER — Other Ambulatory Visit: Payer: Self-pay | Admitting: Nurse Practitioner

## 2023-10-15 DIAGNOSIS — E1165 Type 2 diabetes mellitus with hyperglycemia: Secondary | ICD-10-CM

## 2023-11-20 ENCOUNTER — Other Ambulatory Visit (HOSPITAL_COMMUNITY): Payer: Self-pay

## 2023-11-20 ENCOUNTER — Telehealth: Payer: Self-pay

## 2023-11-20 NOTE — Telephone Encounter (Signed)
*  Primary  Pharmacy Patient Advocate Encounter   Received notification from CoverMyMeds that prior authorization for Veozah 45MG  tablets  is required/requested.   Insurance verification completed.   The patient is insured through [facility] .   Per test claim: PA required; PA started via CoverMyMeds. KEY [id number] . Please see clinical question(s) below that I am not finding the answer to in her chart and advise.    The patient's drug benefit plan provides coverage for other drugs which may be considered for treating your patient. Can your patient be treated with a formulary drug? Available Formulary Alternatives: estradiol, estradiol-norethindrone, CLIMARA PRO, COMBIPATCH, DUAVEE, PREMPHASE, PREMPRO [NOTE: If yes, provide your patient with a new prescription for the formulary product.]

## 2023-11-21 ENCOUNTER — Other Ambulatory Visit (HOSPITAL_COMMUNITY): Payer: Self-pay

## 2023-11-21 NOTE — Telephone Encounter (Signed)
PA submitted with this additional information and is pending determination.

## 2023-11-21 NOTE — Telephone Encounter (Signed)
Pharmacy Patient Advocate Encounter  Received notification from [facility] that Prior Authorization for Veozah 45MG  tablets has been APPROVED from 11/21/2023 to 11/20/2024. Ran test claim, Copay is $75.00. This test claim was processed through [facility]- copay amounts may vary at other pharmacies due to pharmacy/plan contracts, or as the patient moves through the different stages of their insurance plan.   PA #/Case ID/Reference #: [id number]

## 2023-12-04 ENCOUNTER — Encounter: Payer: Self-pay | Admitting: Orthopaedic Surgery

## 2023-12-04 ENCOUNTER — Other Ambulatory Visit: Payer: Self-pay | Admitting: Orthopaedic Surgery

## 2023-12-04 DIAGNOSIS — M23332 Other meniscus derangements, other medial meniscus, left knee: Secondary | ICD-10-CM | POA: Diagnosis not present

## 2023-12-04 DIAGNOSIS — M23342 Other meniscus derangements, anterior horn of lateral meniscus, left knee: Secondary | ICD-10-CM | POA: Diagnosis not present

## 2023-12-04 DIAGNOSIS — M94262 Chondromalacia, left knee: Secondary | ICD-10-CM | POA: Diagnosis not present

## 2023-12-04 MED ORDER — ONDANSETRON HCL 4 MG PO TABS: 4.0000 mg | ORAL_TABLET | Freq: Three times a day (TID) | ORAL | 0 refills | Status: DC | PRN

## 2023-12-04 MED ORDER — HYDROCODONE-ACETAMINOPHEN 5-325 MG PO TABS: 1.0000 | ORAL_TABLET | Freq: Two times a day (BID) | ORAL | 0 refills | Status: DC | PRN

## 2023-12-11 ENCOUNTER — Ambulatory Visit: Payer: 59 | Admitting: Physician Assistant

## 2023-12-11 ENCOUNTER — Encounter: Payer: Self-pay | Admitting: Physician Assistant

## 2023-12-11 DIAGNOSIS — Z9889 Other specified postprocedural states: Secondary | ICD-10-CM

## 2023-12-11 NOTE — Progress Notes (Signed)
 Post-Op Visit Note   Patient: Alicia Booth           Date of Birth: 1971/02/12           MRN: [id number] Visit Date: 12/11/2023 PCP: Katheen Roselie Rockford, NP   Assessment & Plan:  Chief Complaint:  Chief Complaint  Patient presents with   Left Knee - Follow-up    Left knee scope 12/04/2023   Visit Diagnoses:  1. S/P left knee arthroscopy     Plan: Patient is a pleasant 53 year old female who comes in today 1 week status post left knee arthroscopic debridement medial and lateral meniscus as well as chondroplasty with abrasion arthroplasty 12/04/2023.  She has been doing well.  She is in no pain at this time.  It was noted during operative intervention she had grade 3 changes to all 3 compartments with an area of grade 4 changes to the lateral compartment.  Examination of the left knee reveals fully healed surgical portals without complication.  Calf is mildly tender.  Negative Homans.  No skin changes.  She is neurovascular intact distally.  Today, portal sutures were removed and Steri-Strips applied.  Intraoperative pictures reviewed.  Home exercise program provided.  She develops any swelling to the left lower extremity, increased pain to the calf or skin changes she will let me know.  Otherwise, follow-up in 5 weeks for recheck.  Call with concerns or questions.  Follow-Up Instructions: Return in about 5 weeks (around 01/15/2024).   Orders:  No orders of the defined types were placed in this encounter.  No orders of the defined types were placed in this encounter.   Imaging: No new imaging  PMFS History: Patient Active Problem List   Diagnosis Date Noted   Other fatigue 07/25/2023   Seborrheic dermatitis of scalp 05/22/2023   Flexural atopic dermatitis 05/22/2023   Chronic diastolic congestive heart failure (HCC) 01/24/2023   Chronic vaginitis 10/24/2022   Hyperlipidemia associated with type 2 diabetes mellitus (HCC) 10/24/2022   Chronic tension-type headache, not  intractable 05/23/2022   Trigger finger, left little finger 05/23/2022   Takotsubo cardiomyopathy 04/24/2022   S/P gastric sleeve procedure 04/24/2022   Adjustment disorder with mixed anxiety and depressed mood 04/24/2022   Perimenopausal symptom 04/24/2022   NSTEMI (non-ST elevated myocardial infarction) (HCC) 04/11/2022   Myopia with astigmatism and presbyopia, bilateral 03/19/2018   Obstructive sleep apnea (adult) (pediatric) 10/13/2012   Diabetes (HCC) 11/16/2009   Hypertension 11/16/2009   Past Medical History:  Diagnosis Date   Diabetes (HCC)    High cholesterol    Hypertension    Stress-induced cardiomyopathy     Family History  Problem Relation Age of Onset   Breast cancer Mother 70   Heart attack Maternal Grandmother 73    Past Surgical History:  Procedure Laterality Date   BARIATRIC SURGERY  2014   BREAST BIOPSY Left    lymph node bx benign   HEEL SPUR EXCISION     LEFT HEART CATH AND CORONARY ANGIOGRAPHY N/A 04/11/2022   Procedure: LEFT HEART CATH AND CORONARY ANGIOGRAPHY;  Surgeon: Dann Candyce RAMAN, MD;  Location: [facility];  Service: Cardiovascular;  Laterality: N/A;   MENISCUS REPAIR     tummy tuck     Social History   Occupational History   Not on file  Tobacco Use   Smoking status: Never   Smokeless tobacco: Never  Vaping Use   Vaping status: Never Used  Substance and Sexual Activity  Alcohol use: Yes    Comment: occassionally   Drug use: Never   Sexual activity: Yes

## 2023-12-18 ENCOUNTER — Encounter: Payer: Self-pay | Admitting: Orthopaedic Surgery

## 2023-12-19 ENCOUNTER — Ambulatory Visit (HOSPITAL_COMMUNITY)
Admission: RE | Admit: 2023-12-19 | Discharge: 2023-12-19 | Disposition: A | Discharge status: Home / Self Care | Payer: 59 | Source: Ambulatory Visit | Attending: Orthopaedic Surgery | Admitting: Orthopaedic Surgery

## 2023-12-19 ENCOUNTER — Other Ambulatory Visit: Payer: Self-pay

## 2023-12-19 DIAGNOSIS — M79662 Pain in left lower leg: Secondary | ICD-10-CM | POA: Diagnosis present

## 2023-12-19 DIAGNOSIS — M7989 Other specified soft tissue disorders: Secondary | ICD-10-CM

## 2023-12-19 NOTE — Telephone Encounter (Signed)
 If she is complaining of tenderness at all in her calf, we should probably order an [place] to r/o dvt

## 2024-01-13 ENCOUNTER — Other Ambulatory Visit: Payer: Self-pay

## 2024-01-13 ENCOUNTER — Other Ambulatory Visit: Payer: Self-pay | Admitting: Nurse Practitioner

## 2024-01-13 DIAGNOSIS — E1165 Type 2 diabetes mellitus with hyperglycemia: Secondary | ICD-10-CM

## 2024-01-13 DIAGNOSIS — N951 Menopausal and female climacteric states: Secondary | ICD-10-CM

## 2024-01-13 MED ORDER — METOPROLOL SUCCINATE ER 100 MG PO TB24: 100.0000 mg | ORAL_TABLET | Freq: Every day | ORAL | 0 refills | Status: DC

## 2024-01-14 ENCOUNTER — Other Ambulatory Visit: Payer: Self-pay

## 2024-01-14 MED ORDER — SPIRONOLACTONE 25 MG PO TABS: 12.5000 mg | ORAL_TABLET | Freq: Every day | ORAL | 0 refills | Status: DC

## 2024-01-26 ENCOUNTER — Encounter: Payer: Self-pay | Admitting: Obstetrics and Gynecology

## 2024-01-26 ENCOUNTER — Ambulatory Visit: Payer: 59 | Admitting: Nurse Practitioner

## 2024-01-26 ENCOUNTER — Ambulatory Visit: Payer: 59 | Admitting: Obstetrics and Gynecology

## 2024-01-26 VITALS — BP 122/78 | HR 80 | Temp 97.9°F | Ht 67.0 in | Wt 186.0 lb

## 2024-01-26 DIAGNOSIS — N951 Menopausal and female climacteric states: Secondary | ICD-10-CM

## 2024-01-26 NOTE — Progress Notes (Deleted)
 53 y.o. G88P0020 female with history of cardiomyopathy, NSTEMI in 2023 (s/p cath), hypertension, type 2 diabetes, OSA here for VMS. Single.  Patient was started on venlafaxine about 1 year ago and Veozah was added 6 months ago for hot flashes.  Symptoms remain poorly controlled.  Pt reports hot flashes all day and night. No menses since Oct 2024, denies mood changes or vaginal dryness.   Patient's last menstrual period was 10/05/2023 (exact date).    Birth control: none Last mammogram: 02/24/23 BIRADS 1, density b Sexually active: no   GYN HISTORY: Bilateral Bartholin's glands that required incision and drainage  OB History  Gravida Para Term Preterm AB Living  2    2   SAB IAB Ectopic Multiple Live Births  2        # Outcome Date GA Lbr Len/2nd Weight Sex Type Anes PTL Lv  2 SAB           1 SAB             Past Medical History:  Diagnosis Date   Diabetes (HCC)    High cholesterol    Hypertension    Stress-induced cardiomyopathy     Past Surgical History:  Procedure Laterality Date   BARIATRIC SURGERY  2014   BREAST BIOPSY Left    lymph node bx benign   HEEL SPUR EXCISION     LEFT HEART CATH AND CORONARY ANGIOGRAPHY N/A 04/11/2022   Procedure: LEFT HEART CATH AND CORONARY ANGIOGRAPHY;  Surgeon: Corky Crafts, MD;  Location: [facility];  Service: Cardiovascular;  Laterality: N/A;   MENISCUS REPAIR     tummy tuck      Current Outpatient Medications on File Prior to Visit  Medication Sig Dispense Refill   Fezolinetant (VEOZAH) 45 MG TABS TAKE ONE TABLET BY MOUTH ONE TIME DAILY 30 tablet 0   fluticasone (FLONASE) 50 MCG/ACT nasal spray Place into both nostrils as needed for allergies or rhinitis.     ketoconazole (NIZORAL) 2 % shampoo Apply 1 Application topically 2 (two) times a week. 120 mL 0   lisinopril (ZESTRIL) 40 MG tablet TAKE ONE TABLET BY MOUTH AT BEDTIME 90 tablet 0   Multiple Vitamins-Minerals (ONE-A-DAY 50 PLUS PO) Take 1 tablet by mouth  at bedtime.     naproxen (NAPROSYN) 500 MG tablet Take one time bid x 2 weeks and then prn 60 tablet 2   nitroGLYCERIN (NITROSTAT) 0.4 MG SL tablet Place 1 tablet (0.4 mg total) under the tongue every 5 (five) minutes x 3 doses as needed for chest pain. 25 tablet 12   rosuvastatin (CRESTOR) 40 MG tablet TAKE ONE TABLET BY MOUTH ONE TIME DAILY 90 tablet 0   Semaglutide, 1 MG/DOSE, (OZEMPIC, 1 MG/DOSE,) 4 MG/3ML SOPN INJECT 1MG  UNDER THE SKIN ONCE WEEKLY ON THE SAME DAY EACH WEEK 3 mL 0   spironolactone (ALDACTONE) 25 MG tablet Take 0.5 tablets (12.5 mg total) by mouth daily. 7 tablet 0   triamcinolone ointment (KENALOG) 0.1 % Apply 1 Application topically 2 (two) times daily. 30 g 0   venlafaxine XR (EFFEXOR-XR) 37.5 MG 24 hr capsule TAKE ONE CAPSULE BY MOUTH ONE TIME DAILY WITH BREAKFAST 30 capsule 5   No current facility-administered medications on file prior to visit.    Allergies  Allergen Reactions   Lipitor [Atorvastatin] Itching      PE Today's Vitals   01/26/24 1339  BP: 122/78  Pulse: 80  Temp: 97.9 F (36.6 C)  TempSrc: Oral  SpO2: 97%  Weight: 186 lb (84.4 kg)  Height: 5\' 7"  (1.702 m)   Body mass index is 29.13 kg/m.  Physical Exam Vitals reviewed.  Constitutional:      General: She is not in acute distress.    Appearance: Normal appearance.  HENT:     Head: Normocephalic and atraumatic.     Nose: Nose normal.  Eyes:     Extraocular Movements: Extraocular movements intact.     Conjunctiva/sclera: Conjunctivae normal.  Pulmonary:     Effort: Pulmonary effort is normal.  Musculoskeletal:        General: Normal range of motion.     Cervical back: Normal range of motion.  Neurological:     General: No focal deficit present.     Mental Status: She is alert.  Psychiatric:        Mood and Affect: Mood normal.        Behavior: Behavior normal.       Assessment and Plan:        Perimenopausal symptom Assessment & Plan: Discussed nonhormonal options for  VMS, including SSRIs, veozah, and gabapentin.  Patient is currently on 2 of the above agents.  We discussed adding a third however patient was undecided on this. We additionally talked about progestin only hormone replacement, as patient has contraindications to estrogen therapy.  After discussion about risk benefits patient decided against adding in this therapy. We discussed lifestyle management that included avoidance of caffeine, CBT and meditation, improve sleep hygiene and cold therapy. Information for insighttimer, a mediation app provided. Patient wants to continue with lifestyle changes and expectant management at this time.  Follow-up 1 year for annual.      Rosalyn Gess, MD

## 2024-01-26 NOTE — Patient Instructions (Signed)
 Meditation has been shown to decrease frequency and severity of hot flashes. Consider using insighttimer, a mediation app, that has a guided meditation for hot flashes.  Alicia Booth is one of many cold therapy products that exist for menopause related hot flashes.

## 2024-01-26 NOTE — Assessment & Plan Note (Deleted)
 Discussed nonhormonal options for VMS, including SSRIs and veozah. SSRIs can have side effects such as hypotension, mood irritability, GI upset, weight gain, sexual dysfunction, and drowsiness. Suicidal ideation can happpen in a subset of patient, and we reviewed that the medication would need to be stopped for this reason. Allyne Gee is contraindicated in patient with liver dysfunction and required q3 months CMP side effects include abdominal pain, diarrhea, back pain, and insomnia. Patient elects for ***.

## 2024-01-26 NOTE — Assessment & Plan Note (Deleted)
 Marland Kitchen

## 2024-01-28 ENCOUNTER — Ambulatory Visit: Payer: 59 | Admitting: Nurse Practitioner

## 2024-01-29 ENCOUNTER — Encounter: Payer: 59 | Admitting: Orthopaedic Surgery

## 2024-01-29 ENCOUNTER — Other Ambulatory Visit: Payer: Self-pay | Admitting: Cardiology

## 2024-01-30 NOTE — Telephone Encounter (Signed)
 Called Alicia Booth and Alicia Booth stated that she would call back to schedule an appt with Dr. Jens Som. Waiting on Alicia Booth to make an appt first before sending in more medication. Alicia Booth has not bee seen since 07/24/2022.

## 2024-02-03 ENCOUNTER — Telehealth: Payer: Self-pay | Admitting: Cardiology

## 2024-02-03 MED ORDER — METOPROLOL SUCCINATE ER 100 MG PO TB24
100.0000 mg | ORAL_TABLET | Freq: Every day | ORAL | 3 refills | Status: AC
Start: 2024-02-03 — End: ?

## 2024-02-03 NOTE — Telephone Encounter (Signed)
*  STAT* If patient is at the pharmacy, call can be transferred to refill team.   1. Which medications need to be refilled? (please list name of each medication and dose if known) metoprolol succinate (TOPROL-XL) 100 MG 24 hr tablet    2. Would you like to learn more about the convenience, safety, & potential cost savings by using the [facility]?      3. Are you open to using the [facility] (Type [facility]. ).   4. Which pharmacy/location (including street and city if local pharmacy) is medication to be sent to? [facility] [address] [place], [place] - [address] AT [facility] [address]    5. Do they need a 30 day or 90 day supply? 90

## 2024-02-09 NOTE — Progress Notes (Unsigned)
 53 y.o. G50P0020 female with history of cardiomyopathy, NSTEMI in 2023 (s/p cath), hypertension, type 2 diabetes, OSA here for VMS. Single.  Patient was started on venlafaxine about 1 year ago and Veozah was added 6 months ago for hot flashes.  Symptoms remain poorly controlled.  Pt reports hot flashes all day and night. No menses since Oct 2024, denies mood changes or vaginal dryness.   Patient's last menstrual period was 10/05/2023 (exact date).    Birth control: none Last mammogram: 02/24/23 BIRADS 1, density b Sexually active: no   GYN HISTORY: Bilateral Bartholin's glands that required incision and drainage   OB History  Gravida Para Term Preterm AB Living  2    2   SAB IAB Ectopic Multiple Live Births  2        # Outcome Date GA Lbr Len/2nd Weight Sex Type Anes PTL Lv  2 SAB           1 SAB             Past Medical History:  Diagnosis Date   Diabetes (HCC)    High cholesterol    Hypertension    Stress-induced cardiomyopathy     Past Surgical History:  Procedure Laterality Date   BARIATRIC SURGERY  2014   BREAST BIOPSY Left    lymph node bx benign   HEEL SPUR EXCISION     LEFT HEART CATH AND CORONARY ANGIOGRAPHY N/A 04/11/2022   Procedure: LEFT HEART CATH AND CORONARY ANGIOGRAPHY;  Surgeon: Corky Crafts, MD;  [facility]: [facility];  Service: Cardiovascular;  Laterality: N/A;   MENISCUS REPAIR     tummy tuck      Current Outpatient Medications on File Prior to Visit  Medication Sig Dispense Refill   Fezolinetant (VEOZAH) 45 MG TABS TAKE ONE TABLET BY MOUTH ONE TIME DAILY 30 tablet 0   fluticasone (FLONASE) 50 MCG/ACT nasal spray Place into both nostrils as needed for allergies or rhinitis.     ketoconazole (NIZORAL) 2 % shampoo Apply 1 Application topically 2 (two) times a week. 120 mL 0   lisinopril (ZESTRIL) 40 MG tablet TAKE ONE TABLET BY MOUTH AT BEDTIME 90 tablet 0   Multiple Vitamins-Minerals (ONE-A-DAY 50 PLUS PO) Take 1 tablet by mouth  at bedtime.     naproxen (NAPROSYN) 500 MG tablet Take one time bid x 2 weeks and then prn 60 tablet 2   nitroGLYCERIN (NITROSTAT) 0.4 MG SL tablet Place 1 tablet (0.4 mg total) under the tongue every 5 (five) minutes x 3 doses as needed for chest pain. 25 tablet 12   rosuvastatin (CRESTOR) 40 MG tablet TAKE ONE TABLET BY MOUTH ONE TIME DAILY 90 tablet 0   Semaglutide, 1 MG/DOSE, (OZEMPIC, 1 MG/DOSE,) 4 MG/3ML SOPN INJECT 1MG  UNDER THE SKIN ONCE WEEKLY ON THE SAME DAY EACH WEEK 3 mL 0   spironolactone (ALDACTONE) 25 MG tablet Take 0.5 tablets (12.5 mg total) by mouth daily. 7 tablet 0   triamcinolone ointment (KENALOG) 0.1 % Apply 1 Application topically 2 (two) times daily. 30 g 0   venlafaxine XR (EFFEXOR-XR) 37.5 MG 24 hr capsule TAKE ONE CAPSULE BY MOUTH ONE TIME DAILY WITH BREAKFAST 30 capsule 5   No current facility-administered medications on file prior to visit.    Allergies  Allergen Reactions   Lipitor [Atorvastatin] Itching      PE Today's Vitals   01/26/24 1339  BP: 122/78  Pulse: 80  Temp: 97.9 F (36.6 C)  TempSrc: Oral  SpO2: 97%  Weight: 186 lb (84.4 kg)  Height: 5\' 7"  (1.702 m)   Body mass index is 29.13 kg/m.  Physical Exam Vitals reviewed.  Constitutional:      General: She is not in acute distress.    Appearance: Normal appearance.  HENT:     Head: Normocephalic and atraumatic.     Nose: Nose normal.  Eyes:     Extraocular Movements: Extraocular movements intact.     Conjunctiva/sclera: Conjunctivae normal.  Pulmonary:     Effort: Pulmonary effort is normal.  Musculoskeletal:        General: Normal range of motion.     Cervical back: Normal range of motion.  Neurological:     General: No focal deficit present.     Mental Status: She is alert.  Psychiatric:        Mood and Affect: Mood normal.        Behavior: Behavior normal.      Assessment and Plan:        Perimenopause Discussed nonhormonal options for VMS, including SSRIs, veozah,  and gabapentin.  Patient is currently on 2 of the above agents.  We discussed adding a third however patient was undecided on this. We additionally talked about progestin only hormone replacement, as patient has contraindications to estrogen therapy.  After discussion about risk benefits patient decided against adding in this therapy. We discussed lifestyle management that included avoidance of caffeine, CBT and meditation, improve sleep hygiene and cold therapy. Information for insighttimer, a mediation app provided. Patient wants to continue with lifestyle changes and expectant management at this time.  Follow-up 1 year for annual.  Rosalyn Gess, MD

## 2024-02-10 ENCOUNTER — Ambulatory Visit (INDEPENDENT_AMBULATORY_CARE_PROVIDER_SITE_OTHER): Payer: 59 | Admitting: Physician Assistant

## 2024-02-10 DIAGNOSIS — M1712 Unilateral primary osteoarthritis, left knee: Secondary | ICD-10-CM

## 2024-02-10 MED ORDER — NAPROXEN 500 MG PO TABS: ORAL_TABLET | ORAL | 2 refills | Status: DC

## 2024-02-10 NOTE — Progress Notes (Signed)
 Post-Op Visit Note   Patient: Alicia Booth           Date of Birth: 06/27/1971           MRN: [id number] Visit Date: 02/10/2024 PCP: Anne Ng, NP   Assessment & Plan:  Chief Complaint:  Chief Complaint  Patient presents with   Left Knee - Follow-up    Left knee scope 12/04/2023   Visit Diagnoses:  1. Unilateral primary osteoarthritis, left knee     Plan: Patient is a pleasant 53 year old female who comes in today with recurrent left knee pain.  She is a little over 2 months out left knee arthroscopic debridement medial and lateral meniscus as well as abrasion arthroplasty 12/04/2023.  It was noted during operative intervention she had grade 3 changes throughout all 3 compartments with an area of grade 4 changes to the lateral compartment.  She has had increasing pain to the left knee.  She describes this as a constant throb worse when her knees are in a flexed position for a period of time.  She takes an occasional NSAID with relief.  No previous cortisone injection.  Examination of the left knee reveals range of motion of 0 to 125 degrees.  She is neurovascularly intact distally.  At this point, we discussed various treatment options to include cortisone injection versus viscosupplementation injection versus total knee arthroplasty.  Currently not interested in any sort of injections.  She is thinking of proceeding with total knee arthroplasty but would like to wait until January or February of next year.  She will follow-up a few months prior to the dates she wishes to schedule surgery.  I will refill her naproxen in the meantime.  Call with any concerns or questions.  Follow-Up Instructions: Return if symptoms worsen or fail to improve.   Orders:  No orders of the defined types were placed in this encounter.  Meds ordered this encounter  Medications   naproxen (NAPROSYN) 500 MG tablet    Sig: Bid prn pain    Dispense:  60 tablet    Refill:  2    Imaging: No  results found.  PMFS History: Patient Active Problem List   Diagnosis Date Noted   Other fatigue 07/25/2023   Seborrheic dermatitis of scalp 05/22/2023   Flexural atopic dermatitis 05/22/2023   Chronic diastolic congestive heart failure (HCC) 01/24/2023   Chronic vaginitis 10/24/2022   Hyperlipidemia associated with type 2 diabetes mellitus (HCC) 10/24/2022   Chronic tension-type headache, not intractable 05/23/2022   Trigger finger, left little finger 05/23/2022   Takotsubo cardiomyopathy 04/24/2022   S/P gastric sleeve procedure 04/24/2022   Adjustment disorder with mixed anxiety and depressed mood 04/24/2022   Perimenopausal symptom 04/24/2022   NSTEMI (non-ST elevated myocardial infarction) (HCC) 04/11/2022   Myopia with astigmatism and presbyopia, bilateral 03/19/2018   Obstructive sleep apnea (adult) (pediatric) 10/13/2012   Diabetes (HCC) 11/16/2009   Hypertension 11/16/2009   Past Medical History:  Diagnosis Date   Diabetes (HCC)    High cholesterol    Hypertension    Stress-induced cardiomyopathy     Family History  Problem Relation Age of Onset   Breast cancer Mother 45   Rashes / Skin problems Father    Parkinson's disease Father    Heart attack Maternal Grandmother 65   Alzheimer's disease Maternal Grandfather     Past Surgical History:  Procedure Laterality Date   BARIATRIC SURGERY  2014   BREAST BIOPSY Left  lymph node bx benign   HEEL SPUR EXCISION     LEFT HEART CATH AND CORONARY ANGIOGRAPHY N/A 04/11/2022   Procedure: LEFT HEART CATH AND CORONARY ANGIOGRAPHY;  Surgeon: Corky Crafts, MD;  Location: [facility] INVASIVE CV LAB;  Service: Cardiovascular;  Laterality: N/A;   MENISCUS REPAIR     tummy tuck     Social History   Occupational History   Not on file  Tobacco Use   Smoking status: Never   Smokeless tobacco: Never  Vaping Use   Vaping status: Never Used  Substance and Sexual Activity   Alcohol use: Yes    Comment: occassionally   Drug  use: Never   Sexual activity: Yes

## 2024-02-11 ENCOUNTER — Encounter: Payer: Self-pay | Admitting: Nurse Practitioner

## 2024-02-11 ENCOUNTER — Other Ambulatory Visit: Payer: Self-pay | Admitting: Nurse Practitioner

## 2024-02-11 ENCOUNTER — Other Ambulatory Visit: Payer: Self-pay

## 2024-02-11 ENCOUNTER — Other Ambulatory Visit

## 2024-02-11 ENCOUNTER — Ambulatory Visit: Payer: 59 | Admitting: Nurse Practitioner

## 2024-02-11 VITALS — BP 118/76 | HR 78 | Temp 97.8°F | Ht 67.0 in | Wt 184.0 lb

## 2024-02-11 DIAGNOSIS — G44229 Chronic tension-type headache, not intractable: Secondary | ICD-10-CM | POA: Diagnosis not present

## 2024-02-11 DIAGNOSIS — E1169 Type 2 diabetes mellitus with other specified complication: Secondary | ICD-10-CM

## 2024-02-11 DIAGNOSIS — E785 Hyperlipidemia, unspecified: Secondary | ICD-10-CM

## 2024-02-11 DIAGNOSIS — N951 Menopausal and female climacteric states: Secondary | ICD-10-CM

## 2024-02-11 DIAGNOSIS — M62838 Other muscle spasm: Secondary | ICD-10-CM

## 2024-02-11 DIAGNOSIS — E1165 Type 2 diabetes mellitus with hyperglycemia: Secondary | ICD-10-CM

## 2024-02-11 DIAGNOSIS — Z7985 Long-term (current) use of injectable non-insulin antidiabetic drugs: Secondary | ICD-10-CM

## 2024-02-11 DIAGNOSIS — I10 Essential (primary) hypertension: Secondary | ICD-10-CM

## 2024-02-11 DIAGNOSIS — M542 Cervicalgia: Secondary | ICD-10-CM

## 2024-02-11 LAB — COMPREHENSIVE METABOLIC PANEL
ALT: 21 U/L (ref 0–35)
AST: 19 U/L (ref 0–37)
Albumin: 4.5 g/dL (ref 3.5–5.2)
Alkaline Phosphatase: 62 U/L (ref 39–117)
BUN: 7 mg/dL (ref 6–23)
CO2: 33 meq/L — ABNORMAL HIGH (ref 19–32)
Calcium: 9.8 mg/dL (ref 8.4–10.5)
Chloride: 101 meq/L (ref 96–112)
Creatinine, Ser: 0.68 mg/dL (ref 0.40–1.20)
GFR: 98.46 mL/min (ref >60.00)
Glucose, Bld: 95 mg/dL (ref 70–99)
Potassium: 4.1 meq/L (ref 3.5–5.1)
Sodium: 139 meq/L (ref 135–145)
Total Bilirubin: 0.6 mg/dL (ref 0.2–1.2)
Total Protein: 7.6 g/dL (ref 6.0–8.3)

## 2024-02-11 LAB — HEMOGLOBIN A1C: Hgb A1c MFr Bld: 6.1 % (ref 4.6–6.5)

## 2024-02-11 MED ORDER — VENLAFAXINE HCL ER 37.5 MG PO CP24: 37.5000 mg | ORAL_CAPSULE | Freq: Every day | ORAL | 3 refills | Status: DC

## 2024-02-11 MED ORDER — OZEMPIC (1 MG/DOSE) 4 MG/3ML ~~LOC~~ SOPN: 1.0000 mg | PEN_INJECTOR | SUBCUTANEOUS | 1 refills | Status: DC

## 2024-02-11 MED ORDER — ROSUVASTATIN CALCIUM 40 MG PO TABS
40.0000 mg | ORAL_TABLET | Freq: Every day | ORAL | 3 refills | Status: AC
Start: 2024-02-11 — End: ?

## 2024-02-11 MED ORDER — LISINOPRIL 40 MG PO TABS: 40.0000 mg | ORAL_TABLET | Freq: Every day | ORAL | 3 refills | Status: AC

## 2024-02-11 MED ORDER — VEOZAH 45 MG PO TABS
1.0000 | ORAL_TABLET | Freq: Every day | ORAL | 3 refills | Status: DC
Start: 2024-02-11 — End: 2024-08-16

## 2024-02-11 NOTE — Progress Notes (Signed)
 Patient came into office early to have what she thought was fasting labs since she was on this side of town. Placed lab order for A1c and CMP per verbal order from [facility], AGNP-C. Patient informed to still return for appointment later today. She verbalized understanding.

## 2024-02-11 NOTE — Assessment & Plan Note (Addendum)
 Repeat hgbA1c: 6.0% improved UTD with DIABETES eye exam-normal Normal foot exam LDL at goal with crestor Maintain med dose-ozempic F/up in 6months

## 2024-02-11 NOTE — Addendum Note (Signed)
 Addended by: Dorris Fetch on: 02/11/2024 09:47 AM   Modules accepted: Orders

## 2024-02-11 NOTE — Assessment & Plan Note (Signed)
LDL at goal Maintain crestor dose

## 2024-02-11 NOTE — Assessment & Plan Note (Signed)
 Improved hot flashes with veozah and effexor. Denies any adverse effects Repeat CMP-normal

## 2024-02-11 NOTE — Progress Notes (Signed)
 Established Patient Visit  Patient: Alicia Booth   DOB: April 07, 1971   53 y.o. Female  MRN: [id number] Visit Date: 02/11/2024  Subjective:    Chief Complaint  Patient presents with   Follow-up    Refills requested    Neck Pain  This is a chronic problem. The current episode started more than 1 year ago. The problem occurs constantly. The problem has been waxing and waning. The pain is associated with an unknown factor. The pain is present in the right side. The quality of the pain is described as cramping and shooting. The pain is moderate. The symptoms are aggravated by twisting and position. The pain is Worse during the night. Stiffness is present All day. Associated symptoms include headaches, numbness and paresis. Pertinent negatives include no chest pain, fever, leg pain, pain with swallowing, photophobia, syncope, tingling, trouble swallowing, visual change, weakness or weight loss. She has tried neck support for the symptoms. The treatment provided no relief.  Numbness and tingling in bilateral ring and 5th finger Hx of bilateral carpal tunnel. S/p left carpal tunnel release.  Hypertension BP at goal with lisinopril, metoprolol, and spironolactone BP Readings from Last 3 Encounters:  02/11/24 118/76  01/26/24 122/78  07/25/23 110/78    Maintain med doses F/up in 6months  Diabetes (HCC) Repeat hgbA1c: 6.0% improved UTD with DIABETES eye exam-normal Normal foot exam LDL at goal with crestor Maintain med dose-ozempic F/up in 6months   Hyperlipidemia associated with type 2 diabetes mellitus (HCC) LDL at goal Maintain crestor dose  Perimenopausal symptom Improved hot flashes with veozah and effexor. Denies any adverse effects Repeat CMP-normal  Wt Readings from Last 3 Encounters:  02/11/24 184 lb (83.5 kg)  01/26/24 186 lb (84.4 kg)  07/25/23 189 lb 3.2 oz (85.8 kg)     Reviewed medical, surgical, and social history today  Medications: Outpatient  Medications Prior to Visit  Medication Sig   fluticasone (FLONASE) 50 MCG/ACT nasal spray Place into both nostrils as needed for allergies or rhinitis.   ketoconazole (NIZORAL) 2 % shampoo Apply 1 Application topically 2 (two) times a week.   metoprolol succinate (TOPROL-XL) 100 MG 24 hr tablet Take 1 tablet (100 mg total) by mouth daily. Take with or immediately following a meal.   Multiple Vitamins-Minerals (ONE-A-DAY 50 PLUS PO) Take 1 tablet by mouth at bedtime.   naproxen (NAPROSYN) 500 MG tablet Bid prn pain   nitroGLYCERIN (NITROSTAT) 0.4 MG SL tablet Place 1 tablet (0.4 mg total) under the tongue every 5 (five) minutes x 3 doses as needed for chest pain.   spironolactone (ALDACTONE) 25 MG tablet Take 0.5 tablets (12.5 mg total) by mouth daily.   triamcinolone ointment (KENALOG) 0.1 % Apply 1 Application topically 2 (two) times daily.   [DISCONTINUED] Fezolinetant (VEOZAH) 45 MG TABS TAKE ONE TABLET BY MOUTH ONE TIME DAILY   [DISCONTINUED] lisinopril (ZESTRIL) 40 MG tablet TAKE ONE TABLET BY MOUTH AT BEDTIME   [DISCONTINUED] rosuvastatin (CRESTOR) 40 MG tablet TAKE ONE TABLET BY MOUTH ONE TIME DAILY   [DISCONTINUED] Semaglutide, 1 MG/DOSE, (OZEMPIC, 1 MG/DOSE,) 4 MG/3ML SOPN INJECT 1MG  UNDER THE SKIN ONCE WEEKLY ON THE SAME DAY EACH WEEK   [DISCONTINUED] venlafaxine XR (EFFEXOR-XR) 37.5 MG 24 hr capsule TAKE ONE CAPSULE BY MOUTH ONE TIME DAILY WITH BREAKFAST   No facility-administered medications prior to visit.   Reviewed past medical and social history.   ROS per  HPI above      Objective:  BP 118/76 (BP Location: Left Arm, Patient Position: Sitting, Cuff Size: Normal)   Pulse 78   Temp 97.8 F (36.6 C) (Temporal)   Ht 5\' 7"  (1.702 m)   Wt 184 lb (83.5 kg)   LMP 10/05/2023 (Exact Date)   BMI 28.82 kg/m      Physical Exam Vitals and nursing note reviewed.  Neck:     Thyroid: No thyroid mass, thyromegaly or thyroid tenderness.  Cardiovascular:     Rate and Rhythm:  Normal rate and regular rhythm.     Pulses: Normal pulses.     Heart sounds: Normal heart sounds.  Pulmonary:     Effort: Pulmonary effort is normal.     Breath sounds: Normal breath sounds.  Musculoskeletal:     Right shoulder: Tenderness present. No swelling, effusion or bony tenderness. Normal range of motion. Normal strength.     Left shoulder: Normal.     Right upper arm: Normal.     Left upper arm: Normal.     Right wrist: Tenderness present. No effusion, bony tenderness, snuff box tenderness or crepitus. Normal range of motion. Normal pulse.     Left wrist: Tenderness present. No effusion, bony tenderness, snuff box tenderness or crepitus. Normal range of motion. Normal pulse.     Right hand: Tenderness present. No swelling. Normal range of motion. Normal strength. Normal sensation. Normal pulse.     Left hand: Tenderness present. No swelling. Normal range of motion. Normal strength. Normal sensation. Normal pulse.     Cervical back: Crepitus present. No erythema, rigidity or torticollis. Pain with movement and muscular tenderness present. No spinous process tenderness. Normal range of motion.  Lymphadenopathy:     Cervical: No cervical adenopathy.  Neurological:     Mental Status: She is alert and oriented to person, place, and time.     Results for orders placed or performed in visit on 02/11/24  Comp Met (CMET)  Result Value Ref Range   Sodium 139 135 - 145 mEq/L   Potassium 4.1 3.5 - 5.1 mEq/L   Chloride 101 96 - 112 mEq/L   CO2 33 (H) 19 - 32 mEq/L   Glucose, Bld 95 70 - 99 mg/dL   BUN 7 6 - 23 mg/dL   Creatinine, Ser 4.09 0.40 - 1.20 mg/dL   Total Bilirubin 0.6 0.2 - 1.2 mg/dL   Alkaline Phosphatase 62 39 - 117 U/L   AST 19 0 - 37 U/L   ALT 21 0 - 35 U/L   Total Protein 7.6 6.0 - 8.3 g/dL   Albumin 4.5 3.5 - 5.2 g/dL   GFR 81.19 >14.78 mL/min   Calcium 9.8 8.4 - 10.5 mg/dL  Hemoglobin G9F  Result Value Ref Range   Hgb A1c MFr Bld 6.1 4.6 - 6.5 %       Assessment & Plan:    Problem List Items Addressed This Visit     Chronic tension-type headache, not intractable   Relevant Medications   venlafaxine XR (EFFEXOR-XR) 37.5 MG 24 hr capsule   Other Relevant Orders   DG Cervical Spine Complete   Diabetes (HCC) - Primary   Repeat hgbA1c: 6.0% improved UTD with DIABETES eye exam-normal Normal foot exam LDL at goal with crestor Maintain med dose-ozempic F/up in 6months       Relevant Medications   lisinopril (ZESTRIL) 40 MG tablet   rosuvastatin (CRESTOR) 40 MG tablet   Semaglutide, 1 MG/DOSE, (OZEMPIC, 1  MG/DOSE,) 4 MG/3ML SOPN   Hyperlipidemia associated with type 2 diabetes mellitus (HCC)   LDL at goal Maintain crestor dose      Relevant Medications   lisinopril (ZESTRIL) 40 MG tablet   rosuvastatin (CRESTOR) 40 MG tablet   Semaglutide, 1 MG/DOSE, (OZEMPIC, 1 MG/DOSE,) 4 MG/3ML SOPN   Hypertension   BP at goal with lisinopril, metoprolol, and spironolactone BP Readings from Last 3 Encounters:  02/11/24 118/76  01/26/24 122/78  07/25/23 110/78    Maintain med doses F/up in 6months      Relevant Medications   lisinopril (ZESTRIL) 40 MG tablet   rosuvastatin (CRESTOR) 40 MG tablet   Perimenopausal symptom   Improved hot flashes with veozah and effexor. Denies any adverse effects Repeat CMP-normal      Relevant Medications   Fezolinetant (VEOZAH) 45 MG TABS   venlafaxine XR (EFFEXOR-XR) 37.5 MG 24 hr capsule   Other Visit Diagnoses       Bilateral posterior neck pain       Relevant Orders   DG Cervical Spine Complete      Return in about 6 months (around 08/13/2024) for HTN, DM, hyperlipidemia (fasting).     Alysia Penna, NP

## 2024-02-11 NOTE — Patient Instructions (Addendum)
 Ok to schedule nurse visit for prevnar vaccine. Maintain current med doses Go to [address] for neck x-ray.

## 2024-02-11 NOTE — Assessment & Plan Note (Signed)
 BP at goal with lisinopril, metoprolol, and spironolactone BP Readings from Last 3 Encounters:  02/11/24 118/76  01/26/24 122/78  07/25/23 110/78    Maintain med doses F/up in 6months

## 2024-02-12 ENCOUNTER — Ambulatory Visit
Admission: RE | Admit: 2024-02-12 | Discharge: 2024-02-12 | Disposition: A | Discharge status: Home / Self Care | Source: Ambulatory Visit | Attending: Nurse Practitioner | Admitting: Nurse Practitioner

## 2024-02-12 DIAGNOSIS — M542 Cervicalgia: Secondary | ICD-10-CM | POA: Diagnosis not present

## 2024-02-12 DIAGNOSIS — G44229 Chronic tension-type headache, not intractable: Secondary | ICD-10-CM

## 2024-02-16 ENCOUNTER — Encounter: Payer: Self-pay | Admitting: Orthopaedic Surgery

## 2024-02-24 ENCOUNTER — Encounter: Payer: Self-pay | Admitting: Nurse Practitioner

## 2024-02-24 DIAGNOSIS — M5030 Other cervical disc degeneration, unspecified cervical region: Secondary | ICD-10-CM

## 2024-02-24 DIAGNOSIS — M5412 Radiculopathy, cervical region: Secondary | ICD-10-CM

## 2024-02-24 DIAGNOSIS — M542 Cervicalgia: Secondary | ICD-10-CM

## 2024-02-25 ENCOUNTER — Ambulatory Visit (INDEPENDENT_AMBULATORY_CARE_PROVIDER_SITE_OTHER)

## 2024-02-25 DIAGNOSIS — Z23 Encounter for immunization: Secondary | ICD-10-CM

## 2024-02-25 MED ORDER — GABAPENTIN 100 MG PO CAPS: ORAL_CAPSULE | ORAL | 5 refills | Status: DC

## 2024-02-25 NOTE — Progress Notes (Signed)
 After obtaining consent, and per orders of Alysia Penna, NP, injection of Prevnar 20 given by Pamala Hurry [facility] in the left deltoid. Patient instructed to remain in clinic for 20 minutes afterwards, and to report any adverse reaction to me immediately. Pt tolerated well.

## 2024-03-04 ENCOUNTER — Other Ambulatory Visit: Payer: Self-pay | Admitting: Nurse Practitioner

## 2024-03-04 DIAGNOSIS — Z0000 Encounter for general adult medical examination without abnormal findings: Secondary | ICD-10-CM

## 2024-03-05 ENCOUNTER — Ambulatory Visit
Admission: RE | Admit: 2024-03-05 | Discharge: 2024-03-05 | Disposition: A | Discharge status: Home / Self Care | Source: Ambulatory Visit | Attending: Nurse Practitioner | Admitting: Nurse Practitioner

## 2024-03-05 DIAGNOSIS — Z0000 Encounter for general adult medical examination without abnormal findings: Secondary | ICD-10-CM

## 2024-03-19 ENCOUNTER — Other Ambulatory Visit: Payer: Self-pay | Admitting: Neurosurgery

## 2024-03-19 DIAGNOSIS — M542 Cervicalgia: Secondary | ICD-10-CM

## 2024-03-24 NOTE — Therapy (Unsigned)
 OUTPATIENT PHYSICAL THERAPY CERVICAL EVALUATION   Patient Name: Alicia Booth MRN: [id number] DOB:1971-12-04, 53 y.o., female Today's Date: 03/25/2024  END OF SESSION:  PT End of Session - 03/25/24 1657     Visit Number 1    Number of Visits 12    Date for PT Re-Evaluation 05/06/24    Authorization Type aetna no copay    Authorization Time Period year    Authorization - Visit Number 1    Authorization - Number of Visits 60    PT Start Time 1530    PT Stop Time 1615    PT Time Calculation (min) 45 min             Past Medical History:  Diagnosis Date   Diabetes (HCC)    High cholesterol    Hypertension    Stress-induced cardiomyopathy    Past Surgical History:  Procedure Laterality Date   BARIATRIC SURGERY  2014   BREAST BIOPSY Left    lymph node bx benign   HEEL SPUR EXCISION     LEFT HEART CATH AND CORONARY ANGIOGRAPHY N/A 04/11/2022   Procedure: LEFT HEART CATH AND CORONARY ANGIOGRAPHY;  Surgeon: Lucendia Rusk, MD;  Location: [facility] INVASIVE CV LAB;  Service: Cardiovascular;  Laterality: N/A;   MENISCUS REPAIR     tummy tuck     Patient Active Problem List   Diagnosis Date Noted   Other fatigue 07/25/2023   Seborrheic dermatitis of scalp 05/22/2023   Flexural atopic dermatitis 05/22/2023   Chronic diastolic congestive heart failure (HCC) 01/24/2023   Chronic vaginitis 10/24/2022   Hyperlipidemia associated with type 2 diabetes mellitus (HCC) 10/24/2022   Chronic tension-type headache, not intractable 05/23/2022   Trigger finger, left little finger 05/23/2022   Takotsubo cardiomyopathy 04/24/2022   S/P gastric sleeve procedure 04/24/2022   Adjustment disorder with mixed anxiety and depressed mood 04/24/2022   Perimenopausal symptom 04/24/2022   NSTEMI (non-ST elevated myocardial infarction) (HCC) 04/11/2022   Myopia with astigmatism and presbyopia, bilateral 03/19/2018   Obstructive sleep apnea (adult) (pediatric) 10/13/2012   Diabetes (HCC) 11/16/2009    Hypertension 11/16/2009    PCP: Nikki Barters, NP  REFERRING PROVIDER: Dr Audie Bleacher  REFERRING DIAG: Cervicalgia   THERAPY DIAG:  Cervicalgia  Other symptoms and signs involving the musculoskeletal system  Muscle weakness (generalized)  Rationale for Evaluation and Treatment: Rehabilitation  ONSET DATE: 01/10/24  SUBJECTIVE:  SUBJECTIVE STATEMENT: Patient reports history of chronic  R neck and upper trap pain which have been present for the past 5 + years. She had some PT in the past with some improvement but continued to have pain and tightness. Symptoms have increased in the past 3-4 months with no known injury or accident. She describes symptoms as pain and tension in the R trap.   Hand dominance: Right  PERTINENT HISTORY:  MI 2023; valvular disease; takotsubo cardiomyopathy; sleep apnea; AODM; arthritis; anxiety; depression; herniorrhaphy; carpal tunnel surgery L 2024; carpal tunnel R    PAIN:  Are you having pain? Yes: NPRS scale: 3/10; in the past week 10/10 03/21/24 Pain location: R cervical and upper trap area  Pain description: tight; aching; shocking  Aggravating factors: tension; bra strap over shoulder; work Relieving factors: meds; sleep  PRECAUTIONS: None  RED FLAGS: None     WEIGHT BEARING RESTRICTIONS: No  FALLS:  Has patient fallen in last 6 months? 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  DIAGNOSTIC FINDINGS:  02/12/24: cervical xray - Normal alignment. Moderate C6-C7 disc space narrowing and anterior spurring, mild C5-C6 anterior spurring and disc space narrowing. No evidence of fracture, focal bone abnormality or bony neural foraminal stenosis. No prevertebral soft tissue thickening.   IMPRESSION: Degenerative disc disease at C5-C6 and C6-C7.  PATIENT SURVEYS:  NDI 18/50; 36%  COGNITION: Overall cognitive status: Within functional limits for tasks assessed  SENSATION: Numbness bilat little fingers at night; sometimes numbness in the LE's   POSTURE: rounded shoulders, forward head, increased thoracic kyphosis, and UE in IR at side; scapulae abducted and rotated along the thoracic spine   PALPATION: Significant muscular tightness in R > L pecs; upper trap; leveator; ant/lat/posterior cervical musculature   CERVICAL ROM:   Active ROM A/PROM (deg) eval  Flexion 50  Extension 50  Right lateral flexion 25  Left lateral flexion 25  Right rotation 62 tight   Left rotation 60 pain R   (Blank rows = not tested)  UPPER EXTREMITY ROM: WFL's some tightness at end range elevation bilat   UPPER EXTREMITY MMT:  MMT Right eval Left eval  Shoulder flexion    Shoulder extension    Shoulder abduction    Shoulder adduction    Shoulder extension    Shoulder internal rotation    Shoulder external rotation    Middle trapezius 4+ 5  Lower trapezius 4 5  Elbow flexion    Elbow extension    Wrist flexion    Wrist extension    Wrist ulnar deviation    Wrist radial deviation    Wrist pronation    Wrist supination    Grip strength     (Blank rows = not tested)  CERVICAL SPECIAL TESTS:  Upper limb tension test (ULTT): Positive, Spurling's test: Negative, and Distraction test: Negative    TREATMENT DATE: 03/27/24 Postural correction and  education Suggestions for improved sitting and standing posture and alignment Exercises per HEP Myofacial ball release work standing  PATIENT EDUCATION:  Education details: POC; HEP  Person educated: Patient Education method: Programmer, multimedia, Demonstration, Actor cues, Verbal cues, and Handouts Education comprehension: verbalized understanding, returned demonstration, verbal cues required, tactile cues required, and needs further education  HOME EXERCISE PROGRAM: Access Code: [email or web address] URL: [email or web address] Date: 03/25/2024 Prepared by: Joeanna Howdyshell  Exercises - Seated Cervical Retraction  - 2 x daily - 7 x weekly - 1-2 sets - 5-10 reps - 10 sec  hold - Supine Cervical Retraction with Towel  - 2 x daily - 7 x weekly - 1 sets - 5-10 reps - 10 sec  hold - Seated Scapular Retraction  - 2 x daily - 7 x weekly - 1-2 sets - 10 reps - 10 sec  hold - Supine Scapular Retraction  - 2 x daily - 7 x weekly - 1 sets - 10 reps - 5-10 sec  hold - Shoulder External Rotation and Scapular Retraction  - 3 x daily - 7 x weekly - 1 sets - 10 reps - 3-5 sec   hold - Shoulder External Rotation in 45 Degrees Abduction  - 2 x daily - 7 x weekly - 1-2 sets - 10 reps - 3 sec  hold - Doorway Pec Stretch at 60 Degrees Abduction  - 3 x daily - 7 x weekly - 1 sets - 3 reps - Doorway Pec Stretch at 90 Degrees Abduction  - 3 x daily - 7 x weekly - 1 sets - 3 reps - 30 seconds  hold - Doorway Pec Stretch at 120 Degrees Abduction  - 3 x daily - 7 x weekly - 1 sets - 3 reps - 30 second hold  hold  Patient Education - Office Posture  ASSESSMENT:  CLINICAL IMPRESSION: Patient is a 53 y.o. female who was seen today for physical therapy evaluation and treatment for cervicalgia. She has a history of chronic cervical and R upper quadrant pain and tightness with intermittent  tightness and numbness into the R arm and bilat little fingers at night. She has poor posture and alignment; limited cervical ROM; pain with cervical ROM ; postural weakness; intermittent UE radicular symptoms; decreased functional activity tolerance including difficulty sleeping. Patient will benefit from PT to address problems identified.   OBJECTIVE IMPAIRMENTS: decreased activity tolerance, decreased ROM, decreased strength, increased fascial restrictions, increased muscle spasms, impaired UE functional use, improper body mechanics, postural dysfunction, and pain.   ACTIVITY LIMITATIONS: carrying, lifting, sitting, sleeping, dressing, and reach over head  PARTICIPATION LIMITATIONS: meal prep, cleaning, laundry, and occupation  PERSONAL FACTORS: Fitness, Past/current experiences, Profession, Time since onset of injury/illness/exacerbation  are also affecting patient's functional outcome.   REHAB POTENTIAL: Good  CLINICAL DECISION MAKING: Evolving/moderate complexity  EVALUATION COMPLEXITY: Moderate   GOALS: Goals reviewed with patient? Yes  SHORT TERM GOALS: Target date: 04/15/2024   Independent in initial HEP  Baseline:  Goal status: INITIAL  2.  Decrease muscular tightness and pain by 25% allowing patient to sleep 3-4 hours without awakening due to pain  Baseline:  Goal status: INITIAL  LONG TERM GOALS: Target date: 05/06/2024   Decrease R cervical and upper trap tightness and pain by 75-80% allowing patient to improve functional level with work, leisure, sleep Baseline:  Goal status: INITIAL  2.  Increase cervical ROM by 10-12 degrees in lateral flexion and 5-7 degrees in rotation  Baseline:  Goal status: INITIAL  3.  Improve posture and alignment with patient to demonstrate improved tissue extensibility through anterior chest/pecs and increased muscular engagement  in posterior shoulder girdle  Baseline:  Goal status: INITIAL  4.  Patient to demonstrate and verbalize  proper posture and alignment for sitting; sleeping; work tasks Baseline:  Goal status: INITIAL  5.  Independent in HEP including aquatic program as indicated  Baseline:  Goal status: INITIAL  6.  Improve NDI by 10 points  Baseline: 18/50; 36%  Goal status: INITIAL   PLAN:  PT FREQUENCY: 2x/week  PT DURATION: 6 weeks  PLANNED INTERVENTIONS: 97110-Therapeutic exercises, 97530- Therapeutic activity, 97112- Neuromuscular re-education, 97535- Self Care, 75643- Manual therapy, Patient/Family education, Taping, Dry Needling, and Joint mobilization  PLAN FOR NEXT SESSION: review and progress exercises; continue with spine care education; postural correction; manual work and modalities as indicated    Carlinda Ohlson P Davy Faught, PT 03/25/2024, 8:36 PM

## 2024-03-25 ENCOUNTER — Encounter: Payer: Self-pay | Admitting: Rehabilitative and Restorative Service Providers"

## 2024-03-25 ENCOUNTER — Other Ambulatory Visit: Payer: Self-pay

## 2024-03-25 ENCOUNTER — Ambulatory Visit: Attending: Neurosurgery | Admitting: Rehabilitative and Restorative Service Providers"

## 2024-03-25 DIAGNOSIS — M6281 Muscle weakness (generalized): Secondary | ICD-10-CM | POA: Insufficient documentation

## 2024-03-25 DIAGNOSIS — R29898 Other symptoms and signs involving the musculoskeletal system: Secondary | ICD-10-CM | POA: Insufficient documentation

## 2024-03-25 DIAGNOSIS — M542 Cervicalgia: Secondary | ICD-10-CM | POA: Diagnosis present

## 2024-03-25 DIAGNOSIS — M25642 Stiffness of left hand, not elsewhere classified: Secondary | ICD-10-CM | POA: Insufficient documentation

## 2024-03-25 DIAGNOSIS — M79642 Pain in left hand: Secondary | ICD-10-CM | POA: Insufficient documentation

## 2024-03-27 ENCOUNTER — Ambulatory Visit

## 2024-03-27 DIAGNOSIS — M542 Cervicalgia: Secondary | ICD-10-CM | POA: Diagnosis not present

## 2024-03-29 ENCOUNTER — Ambulatory Visit

## 2024-03-29 DIAGNOSIS — R29898 Other symptoms and signs involving the musculoskeletal system: Secondary | ICD-10-CM

## 2024-03-29 DIAGNOSIS — M6281 Muscle weakness (generalized): Secondary | ICD-10-CM

## 2024-03-29 DIAGNOSIS — M542 Cervicalgia: Secondary | ICD-10-CM

## 2024-03-29 NOTE — Therapy (Signed)
 OUTPATIENT PHYSICAL THERAPY CERVICAL TREATMENT   Patient Name: Alicia Booth MRN: [id number] DOB:[birth year], 53 y.o., female Today's Date: 03/29/2024  END OF SESSION:  PT End of Session - 03/29/24 1535     Visit Number 2    Number of Visits 12    Date for PT Re-Evaluation 05/06/24    Authorization Type aetna no copay    Authorization - Visit Number 2    Authorization - Number of Visits 60    PT Start Time 1535    PT Stop Time 1628    PT Time Calculation (min) 53 min    Activity Tolerance Patient tolerated treatment well    Behavior During Therapy WFL for tasks assessed/performed            Past Medical History:  Diagnosis Date   Diabetes (HCC)    High cholesterol    Hypertension    Stress-induced cardiomyopathy    Past Surgical History:  Procedure Laterality Date   BARIATRIC SURGERY  2014   BREAST BIOPSY Left    lymph node bx benign   HEEL SPUR EXCISION     LEFT HEART CATH AND CORONARY ANGIOGRAPHY N/A 04/11/2022   Procedure: LEFT HEART CATH AND CORONARY ANGIOGRAPHY;  Surgeon: Lucendia Rusk, MD;  Location: [facility];  Service: Cardiovascular;  Laterality: N/A;   MENISCUS REPAIR     tummy tuck     Patient Active Problem List   Diagnosis Date Noted   Other fatigue 07/25/2023   Seborrheic dermatitis of scalp 05/22/2023   Flexural atopic dermatitis 05/22/2023   Chronic diastolic congestive heart failure (HCC) 01/24/2023   Chronic vaginitis 10/24/2022   Hyperlipidemia associated with type 2 diabetes mellitus (HCC) 10/24/2022   Chronic tension-type headache, not intractable 05/23/2022   Trigger finger, left little finger 05/23/2022   Takotsubo cardiomyopathy 04/24/2022   S/P gastric sleeve procedure 04/24/2022   Adjustment disorder with mixed anxiety and depressed mood 04/24/2022   Perimenopausal symptom 04/24/2022   NSTEMI (non-ST elevated myocardial infarction) (HCC) 04/11/2022   Myopia with astigmatism and presbyopia, bilateral 03/19/2018    Obstructive sleep apnea (adult) (pediatric) 10/13/2012   Diabetes (HCC) 11/16/2009   Hypertension 11/16/2009    PCP: Nikki Barters, NP  REFERRING PROVIDER: Dr Audie Bleacher  REFERRING DIAG: Cervicalgia   THERAPY DIAG:  Cervicalgia  Other symptoms and signs involving the musculoskeletal system  Muscle weakness (generalized)  Rationale for Evaluation and Treatment: Rehabilitation  ONSET DATE: 01/10/24  SUBJECTIVE:  SUBJECTIVE STATEMENT: Patient reports she ended up doing cupping on herself due to increased tension in traps. Patient states she feels "popping" in neck when turning head.   EVAL: Patient reports history of chronic  R neck and upper trap pain which have been present for the past 5 + years. She had some PT in the past with some improvement but continued to have pain and tightness. Symptoms have increased in the past 3-4 months with no known injury or accident. She describes symptoms as pain and tension in the R trap.   Hand dominance: Right  PERTINENT HISTORY:  MI 2023; valvular disease; takotsubo cardiomyopathy; sleep apnea; AODM; arthritis; anxiety; depression; herniorrhaphy; carpal tunnel surgery L 2024; carpal tunnel R    PAIN:  Are you having pain? Yes: NPRS scale: 3/10; in the past week 10/10 03/21/24 Pain location: R cervical and upper trap area  Pain description: tight; aching; shocking  Aggravating factors: tension; bra strap over shoulder; work Relieving factors: meds; sleep  PRECAUTIONS: None  RED FLAGS: None     WEIGHT BEARING RESTRICTIONS: No  FALLS:  Has patient fallen in last 6 months? 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  DIAGNOSTIC FINDINGS:  02/12/24: cervical xray - Normal alignment. Moderate C6-C7 disc space narrowing and anterior spurring, mild C5-C6 anterior spurring and disc space narrowing. No evidence of fracture, focal bone abnormality or bony neural foraminal stenosis. No prevertebral soft tissue thickening.   IMPRESSION: Degenerative disc disease at C5-C6 and C6-C7.  PATIENT SURVEYS:  NDI 18/50; 36%  COGNITION: Overall cognitive status: Within functional limits for tasks assessed  SENSATION: Numbness bilat little fingers at night; sometimes numbness in the LE's   POSTURE: rounded shoulders, forward head, increased thoracic kyphosis, and UE in IR at side; scapulae abducted and rotated along the thoracic spine   PALPATION: Significant muscular tightness in R > L pecs; upper trap; leveator; ant/lat/posterior cervical musculature   CERVICAL ROM:   Active ROM A/PROM (deg) eval  Flexion 50  Extension 50  Right lateral flexion 25  Left lateral flexion 25  Right rotation 62 tight   Left rotation 60 pain R   (Blank rows = not tested)  UPPER EXTREMITY ROM: WFL's some tightness at end range elevation bilat   UPPER EXTREMITY MMT:  MMT Right eval Left eval  Shoulder flexion    Shoulder extension    Shoulder abduction    Shoulder adduction    Shoulder extension    Shoulder internal rotation    Shoulder external rotation    Middle trapezius 4+ 5  Lower trapezius 4 5  Elbow flexion    Elbow extension    Wrist flexion    Wrist extension    Wrist ulnar deviation    Wrist radial deviation    Wrist pronation    Wrist supination     Grip strength     (Blank rows = not tested)  CERVICAL SPECIAL TESTS:  Upper limb tension test (ULTT): Positive, Spurling's test: Negative, and Distraction test: Negative  [facility] Adult PT Treatment:                                                DATE: 03/29/2024 Therapeutic Exercise: Corner pec stretch Thread the needle --> standing with foam roller Spine stretch forward with foam roller UT stretch --> sitting on palm --> hand behind back Manual Therapy: Cupping for myofascial decompression --> static/dynamic, shearing, gliding IASTM (R) upper/mid/low traps Neuromuscular re-ed: Low trap setting at wall Median nerve glide Ulnar nerve glide   TREATMENT DATE: 03/27/24 Postural correction and education Suggestions for improved sitting and standing posture and alignment Exercises per HEP Myofacial ball release work standing                                                                                                                                   PATIENT EDUCATION:  Education details: POC; HEP  Person educated: Patient Education method: Programmer, multimedia, Facilities manager, Actor cues, Verbal cues, and Handouts Education comprehension: verbalized understanding, returned demonstration, verbal cues required, tactile cues required, and needs further education  HOME EXERCISE PROGRAM: Access Code: [email or web address] URL: [email or web address] Date: 03/29/2024 Prepared by: Sims Duck  Exercises - Seated Cervical Retraction  - 2 x daily - 7 x weekly - 1-2 sets - 5-10 reps - 10 sec  hold - Supine Cervical Retraction with Towel  - 2 x daily - 7 x weekly - 1 sets - 5-10 reps - 10 sec  hold - Seated Scapular Retraction  - 2 x daily - 7 x weekly - 1-2 sets - 10 reps - 10 sec  hold - Supine Scapular Retraction  - 2 x daily - 7 x weekly - 1 sets - 10 reps - 5-10 sec  hold - Shoulder External Rotation and Scapular Retraction  - 3 x daily - 7 x weekly - 1 sets - 10 reps - 3-5 sec    hold - Shoulder External Rotation in 45 Degrees Abduction  - 2 x daily - 7 x weekly - 1-2 sets - 10 reps - 3 sec  hold - Doorway Pec Stretch at 60 Degrees Abduction  - 3 x daily - 7 x weekly - 1 sets - 3 reps - Doorway Pec Stretch at 90 Degrees Abduction  - 3 x daily - 7 x weekly - 1 sets - 3 reps - 30 seconds  hold - Doorway Pec Stretch at 120 Degrees Abduction  - 3 x daily - 7 x weekly - 1 sets - 3 reps - 30 second hold  hold - Standing Median Nerve Glide  - 1-2 x daily - 7 x weekly - 1 sets - 10 reps - Standing Ulnar Nerve Glide  - 1-2 x daily - 7 x weekly - 1 sets - 10 reps - Upper Trapezius Stretch  -  3 x daily - 7 x weekly - 1 sets - 3-5 reps - 30 sec hold  Patient Education - Office Posture  ASSESSMENT:  CLINICAL IMPRESSION: Cupping performed with various static/dynamic techniques fro myofascial decompression, focusing on R thoracic paraspinals and trapezius musculature. Nerve glides added to HEP to address symptoms radiating down arms (R>L). Postural stretches and exercises continued.   OBJECTIVE IMPAIRMENTS: decreased activity tolerance, decreased ROM, decreased strength, increased fascial restrictions, increased muscle spasms, impaired UE functional use, improper body mechanics, postural dysfunction, and pain.   ACTIVITY LIMITATIONS: carrying, lifting, sitting, sleeping, dressing, and reach over head  PARTICIPATION LIMITATIONS: meal prep, cleaning, laundry, and occupation  PERSONAL FACTORS: Fitness, Past/current experiences, Profession, Time since onset of injury/illness/exacerbation  are also affecting patient's functional outcome.   REHAB POTENTIAL: Good  CLINICAL DECISION MAKING: Evolving/moderate complexity  EVALUATION COMPLEXITY: Moderate   GOALS: Goals reviewed with patient? Yes  SHORT TERM GOALS: Target date: 04/15/2024   Independent in initial HEP  Baseline:  Goal status: INITIAL  2.  Decrease muscular tightness and pain by 25% allowing patient to sleep 3-4  hours without awakening due to pain  Baseline:  Goal status: INITIAL  LONG TERM GOALS: Target date: 05/06/2024   Decrease R cervical and upper trap tightness and pain by 75-80% allowing patient to improve functional level with work, leisure, sleep Baseline:  Goal status: INITIAL  2.  Increase cervical ROM by 10-12 degrees in lateral flexion and 5-7 degrees in rotation  Baseline:  Goal status: INITIAL  3.  Improve posture and alignment with patient to demonstrate improved tissue extensibility through anterior chest/pecs and increased muscular engagement in posterior shoulder girdle  Baseline:  Goal status: INITIAL  4.  Patient to demonstrate and verbalize proper posture and alignment for sitting; sleeping; work tasks Baseline:  Goal status: INITIAL  5.  Independent in HEP including aquatic program as indicated  Baseline:  Goal status: INITIAL  6.  Improve NDI by 10 points  Baseline: 18/50; 36%  Goal status: INITIAL   PLAN:  PT FREQUENCY: 2x/week  PT DURATION: 6 weeks  PLANNED INTERVENTIONS: 97110-Therapeutic exercises, 97530- Therapeutic activity, 97112- Neuromuscular re-education, 97535- Self Care, 60454- Manual therapy, Patient/Family education, Taping, Dry Needling, and Joint mobilization  PLAN FOR NEXT SESSION: review and progress exercises; continue with spine care education; postural correction; manual work and modalities as indicated    Flint Hummer, PTA 03/29/2024, 4:28 PM

## 2024-04-01 ENCOUNTER — Encounter: Payer: Self-pay | Admitting: Rehabilitative and Restorative Service Providers"

## 2024-04-01 ENCOUNTER — Ambulatory Visit: Admitting: Rehabilitative and Restorative Service Providers"

## 2024-04-01 DIAGNOSIS — M6281 Muscle weakness (generalized): Secondary | ICD-10-CM

## 2024-04-01 DIAGNOSIS — M542 Cervicalgia: Secondary | ICD-10-CM

## 2024-04-01 DIAGNOSIS — R29898 Other symptoms and signs involving the musculoskeletal system: Secondary | ICD-10-CM

## 2024-04-01 NOTE — Therapy (Signed)
 OUTPATIENT PHYSICAL THERAPY CERVICAL TREATMENT   Patient Name: Alicia Booth MRN: [id number] DOB:[birth year], 53 y.o., female Today's Date: 04/01/2024  END OF SESSION:  PT End of Session - 04/01/24 1537     Visit Number 3    Number of Visits 12    Date for PT Re-Evaluation 05/06/24    Authorization Type aetna no copay    Authorization Time Period year    Authorization - Visit Number 3    Authorization - Number of Visits 60    PT Start Time 1535    PT Stop Time 1620    PT Time Calculation (min) 45 min    Activity Tolerance Patient tolerated treatment well            Past Medical History:  Diagnosis Date   Diabetes (HCC)    High cholesterol    Hypertension    Stress-induced cardiomyopathy    Past Surgical History:  Procedure Laterality Date   BARIATRIC SURGERY  2014   BREAST BIOPSY Left    lymph node bx benign   HEEL SPUR EXCISION     LEFT HEART CATH AND CORONARY ANGIOGRAPHY N/A 04/11/2022   Procedure: LEFT HEART CATH AND CORONARY ANGIOGRAPHY;  Surgeon: Lucendia Rusk, MD;  Location: [facility];  Service: Cardiovascular;  Laterality: N/A;   MENISCUS REPAIR     tummy tuck     Patient Active Problem List   Diagnosis Date Noted   Other fatigue 07/25/2023   Seborrheic dermatitis of scalp 05/22/2023   Flexural atopic dermatitis 05/22/2023   Chronic diastolic congestive heart failure (HCC) 01/24/2023   Chronic vaginitis 10/24/2022   Hyperlipidemia associated with type 2 diabetes mellitus (HCC) 10/24/2022   Chronic tension-type headache, not intractable 05/23/2022   Trigger finger, left little finger 05/23/2022   Takotsubo cardiomyopathy 04/24/2022   S/P gastric sleeve procedure 04/24/2022   Adjustment disorder with mixed anxiety and depressed mood 04/24/2022   Perimenopausal symptom 04/24/2022   NSTEMI (non-ST elevated myocardial infarction) (HCC) 04/11/2022   Myopia with astigmatism and presbyopia, bilateral 03/19/2018   Obstructive sleep apnea (adult)  (pediatric) 10/13/2012   Diabetes (HCC) 11/16/2009   Hypertension 11/16/2009    PCP: Nikki Barters, NP  REFERRING PROVIDER: Dr Audie Bleacher  REFERRING DIAG: Cervicalgia   THERAPY DIAG:  Cervicalgia  Other symptoms and signs involving the musculoskeletal system  Muscle weakness (generalized)  Rationale for Evaluation and Treatment: Rehabilitation  ONSET DATE: 01/10/24  SUBJECTIVE:  SUBJECTIVE STATEMENT: Patient reports that she had a lot of pain on Sunday. She cooked a lot for Sunday. Doing better today. Working on her exercises at home.    EVAL: Patient reports history of chronic  R neck and upper trap pain which have been present for the past 5 + years. She had some PT in the past with some improvement but continued to have pain and tightness. Symptoms have increased in the past 3-4 months with no known injury or accident. She describes symptoms as pain and tension in the R trap.   Hand dominance: Right  PERTINENT HISTORY:  MI 2023; valvular disease; takotsubo cardiomyopathy; sleep apnea; AODM; arthritis; anxiety; depression; herniorrhaphy; carpal tunnel surgery L 2024; carpal tunnel R    PAIN:  Are you having pain? Yes: NPRS scale: 1-2/10; in the past week 10/10 03/21/24 Pain location: R cervical and upper trap area  Pain description: tight; aching; shocking  Aggravating factors: tension; bra strap over shoulder; work Relieving factors: meds; sleep  PRECAUTIONS: None   WEIGHT BEARING RESTRICTIONS: No  FALLS:  Has patient fallen in last 6 months? 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  DIAGNOSTIC FINDINGS:  02/12/24: cervical xray - Normal alignment. Moderate C6-C7 disc space narrowing and anterior spurring, mild C5-C6 anterior spurring and disc space narrowing. No evidence of fracture, focal bone abnormality or bony neural foraminal stenosis. No prevertebral soft tissue thickening.   IMPRESSION: Degenerative disc disease at C5-C6 and C6-C7.  PATIENT SURVEYS:  NDI 18/50; 36%  COGNITION: Overall cognitive status: Within functional limits for tasks assessed  SENSATION: Numbness bilat little fingers at night; sometimes numbness in the LE's   POSTURE: rounded shoulders, forward head, increased thoracic kyphosis, and UE in IR at side; scapulae abducted and rotated along the thoracic spine   PALPATION: Significant muscular tightness in R > L pecs; upper trap; leveator; ant/lat/posterior cervical musculature   CERVICAL ROM:   Active ROM A/PROM (deg) eval  Flexion 50  Extension 50  Right lateral flexion 25  Left lateral flexion 25  Right rotation 62 tight   Left rotation 60 pain R   (Blank rows = not tested)  UPPER EXTREMITY ROM: WFL's some tightness at end range elevation bilat   UPPER EXTREMITY MMT:  MMT Right eval Left eval  Shoulder flexion    Shoulder extension    Shoulder abduction    Shoulder adduction    Shoulder extension    Shoulder internal rotation    Shoulder external rotation    Middle trapezius 4+ 5  Lower trapezius 4 5  Elbow flexion    Elbow extension    Wrist flexion    Wrist extension    Wrist ulnar deviation    Wrist radial deviation    Wrist pronation    Wrist supination    Grip strength     (Blank rows = not tested)  CERVICAL SPECIAL TESTS:   Upper limb tension test (ULTT): Positive, Spurling's test: Negative, and Distraction test: Negative   [facility] Adult PT Treatment:  DATE: 04/01/2024 Therapeutic Exercise: Doorway stretch 3 positions 30 sec  Standing chin tuck 3 sec x 5 Standing scap squeeze 3 sec x 5 Standing scap squeeze with ER 3 sec x 5  Standing W 3 sec x 5  Therapeutic Activity:  Standing scap squeeze with ER red TB 3 sec x 10 x 2   Standing W with red TB 3 sec x 10 x 2  Sitting thoracic extension with coregeous ball 10 sec x 5  Stepping under orange physio ball for shoulder flexion stretch 30 sec x 3  Manual Therapy: STM through cervical and thoracic spine musculature; pecs  Manual cervical traction supine  Cervical PROM and stretch  Neuromuscular re-ed: Working on posture and alignment engaging posterior shoulder girdle musculature UE active movement to release tightness and increase awarenss of muscular tightness through neck and shoulders    [facility] Adult PT Treatment:                                                DATE: 03/29/2024 Therapeutic Exercise: Corner pec stretch Thread the needle --> standing with foam roller Spine stretch forward with foam roller UT stretch --> sitting on palm --> hand behind back Manual Therapy: Cupping for myofascial decompression --> static/dynamic, shearing, gliding IASTM (R) upper/mid/low traps Neuromuscular re-ed: Low trap setting at wall Median nerve glide Ulnar nerve glide   TREATMENT DATE: 03/27/24 Postural correction and education Suggestions for improved sitting and standing posture and alignment Exercises per HEP Myofacial ball release work standing                                                                                                                                   PATIENT EDUCATION:  Education details: POC; HEP  Person educated: Patient Education method: Programmer, multimedia, Facilities manager, Actor cues,  Verbal cues, and Handouts Education comprehension: verbalized understanding, returned demonstration, verbal cues required, tactile cues required, and needs further education  HOME EXERCISE PROGRAM: Access Code: [email or web address] URL: [email or web address] Date: 04/01/2024 Prepared by: Vertis Bauder  Exercises - Seated Cervical Retraction  - 2 x daily - 7 x weekly - 1-2 sets - 5-10 reps - 10 sec  hold - Supine Cervical Retraction with Towel  - 2 x daily - 7 x weekly - 1 sets - 5-10 reps - 10 sec  hold - Seated Scapular Retraction  - 2 x daily - 7 x weekly - 1-2 sets - 10 reps - 10 sec  hold - Supine Scapular Retraction  - 2 x daily - 7 x weekly - 1 sets - 10 reps - 5-10 sec  hold - Shoulder External Rotation and Scapular Retraction  - 3 x daily - 7 x weekly - 1 sets - 10 reps - 3-5 sec   hold - Shoulder External Rotation in  45 Degrees Abduction  - 2 x daily - 7 x weekly - 1-2 sets - 10 reps - 3 sec  hold - Doorway Pec Stretch at 60 Degrees Abduction  - 3 x daily - 7 x weekly - 1 sets - 3 reps - Doorway Pec Stretch at 90 Degrees Abduction  - 3 x daily - 7 x weekly - 1 sets - 3 reps - 30 seconds  hold - Doorway Pec Stretch at 120 Degrees Abduction  - 3 x daily - 7 x weekly - 1 sets - 3 reps - 30 second hold  hold - Standing Median Nerve Glide  - 1-2 x daily - 7 x weekly - 1 sets - 10 reps - Standing Ulnar Nerve Glide  - 1-2 x daily - 7 x weekly - 1 sets - 10 reps - Upper Trapezius Stretch  - 3 x daily - 7 x weekly - 1 sets - 3-5 reps - 30 sec hold - Shoulder External Rotation and Scapular Retraction with Resistance  - 2 x daily - 7 x weekly - 1 sets - 10 reps - 3-5 sec  hold - Shoulder W - External Rotation with Resistance  - 2 x daily - 7 x weekly - 1-2 sets - 10 reps - 3 sec  hold - Seated Thoracic Lumbar Extension with Pectoralis Stretch  - 2 x daily - 7 x weekly - 1 sets - 5-8 reps - 10 sec  hold  Patient Education - Office Posture  ASSESSMENT:  CLINICAL IMPRESSION: Patient reports  some improvement in tightness through the R shoulder/upper trap area. She is working on Clinical cytogeneticist. Working on exercises at home. Treatment consisted of stretching through pecs and thoracic spine as well as posterior shoulder girdle strengthening. Continued with manual work including passive cervical stretching. Good response to treatment.     EVAL: history of chronic cervical and R upper quadrant pain and tightness with intermittent tightness and numbness into the R arm and bilat little fingers at night. She has poor posture and alignment; limited cervical ROM; pain with cervical ROM ; postural weakness; intermittent UE radicular symptoms; decreased functional activity tolerance including difficulty sleeping.    OBJECTIVE IMPAIRMENTS: decreased activity tolerance, decreased ROM, decreased strength, increased fascial restrictions, increased muscle spasms, impaired UE functional use, improper body mechanics, postural dysfunction, and pain.   GOALS: Goals reviewed with patient? Yes  SHORT TERM GOALS: Target date: 04/15/2024   Independent in initial HEP  Baseline:  Goal status: INITIAL  2.  Decrease muscular tightness and pain by 25% allowing patient to sleep 3-4 hours without awakening due to pain  Baseline:  Goal status: INITIAL  LONG TERM GOALS: Target date: 05/06/2024   Decrease R cervical and upper trap tightness and pain by 75-80% allowing patient to improve functional level with work, leisure, sleep Baseline:  Goal status: INITIAL  2.  Increase cervical ROM by 10-12 degrees in lateral flexion and 5-7 degrees in rotation  Baseline:  Goal status: INITIAL  3.  Improve posture and alignment with patient to demonstrate improved tissue extensibility through anterior chest/pecs and increased muscular engagement in posterior shoulder girdle  Baseline:  Goal status: INITIAL  4.  Patient to demonstrate and verbalize proper posture and alignment for sitting; sleeping;  work tasks Baseline:  Goal status: INITIAL  5.  Independent in HEP including aquatic program as indicated  Baseline:  Goal status: INITIAL  6.  Improve NDI by 10 points  Baseline: 18/50; 36%  Goal status: INITIAL   PLAN:  PT FREQUENCY: 2x/week  PT DURATION: 6 weeks  PLANNED INTERVENTIONS: 97110-Therapeutic exercises, 97530- Therapeutic activity, 97112- Neuromuscular re-education, 97535- Self Care, 16109- Manual therapy, Patient/Family education, Taping, Dry Needling, and Joint mobilization  PLAN FOR NEXT SESSION: review and progress exercises; continue with spine care education; postural correction; manual work and modalities as indicated    Elisia Stepp P Johnella Crumm, PT 04/01/2024, 3:37 PM

## 2024-04-05 ENCOUNTER — Ambulatory Visit: Admitting: Rehabilitative and Restorative Service Providers"

## 2024-04-05 ENCOUNTER — Encounter: Payer: Self-pay | Admitting: Rehabilitative and Restorative Service Providers"

## 2024-04-05 DIAGNOSIS — R29898 Other symptoms and signs involving the musculoskeletal system: Secondary | ICD-10-CM

## 2024-04-05 DIAGNOSIS — M542 Cervicalgia: Secondary | ICD-10-CM

## 2024-04-05 DIAGNOSIS — M6281 Muscle weakness (generalized): Secondary | ICD-10-CM

## 2024-04-05 NOTE — Therapy (Signed)
 OUTPATIENT PHYSICAL THERAPY CERVICAL TREATMENT   Patient Name: Alicia Booth MRN: [id number] DOB:13-Oct-1971, 53 y.o., female Today's Date: 04/05/2024  END OF SESSION:  PT End of Session - 04/05/24 1403     Visit Number 4    Number of Visits 12    Date for PT Re-Evaluation 05/06/24    Authorization Type aetna no copay    Authorization Time Period year    Authorization - Visit Number 4    Authorization - Number of Visits 60    PT Start Time 1400    PT Stop Time 1445    PT Time Calculation (min) 45 min    Activity Tolerance Patient tolerated treatment well            Past Medical History:  Diagnosis Date   Diabetes (HCC)    High cholesterol    Hypertension    Stress-induced cardiomyopathy    Past Surgical History:  Procedure Laterality Date   BARIATRIC SURGERY  2014   BREAST BIOPSY Left    lymph node bx benign   HEEL SPUR EXCISION     LEFT HEART CATH AND CORONARY ANGIOGRAPHY N/A 04/11/2022   Procedure: LEFT HEART CATH AND CORONARY ANGIOGRAPHY;  Surgeon: Lucendia Rusk, MD;  Location: [facility];  Service: Cardiovascular;  Laterality: N/A;   MENISCUS REPAIR     tummy tuck     Patient Active Problem List   Diagnosis Date Noted   Other fatigue 07/25/2023   Seborrheic dermatitis of scalp 05/22/2023   Flexural atopic dermatitis 05/22/2023   Chronic diastolic congestive heart failure (HCC) 01/24/2023   Chronic vaginitis 10/24/2022   Hyperlipidemia associated with type 2 diabetes mellitus (HCC) 10/24/2022   Chronic tension-type headache, not intractable 05/23/2022   Trigger finger, left little finger 05/23/2022   Takotsubo cardiomyopathy 04/24/2022   S/P gastric sleeve procedure 04/24/2022   Adjustment disorder with mixed anxiety and depressed mood 04/24/2022   Perimenopausal symptom 04/24/2022   NSTEMI (non-ST elevated myocardial infarction) (HCC) 04/11/2022   Myopia with astigmatism and presbyopia, bilateral 03/19/2018   Obstructive sleep apnea (adult)  (pediatric) 10/13/2012   Diabetes (HCC) 11/16/2009   Hypertension 11/16/2009    PCP: Nikki Barters, NP  REFERRING PROVIDER: Dr Audie Bleacher  REFERRING DIAG: Cervicalgia   THERAPY DIAG:  Cervicalgia  Other symptoms and signs involving the musculoskeletal system  Muscle weakness (generalized)  Rationale for Evaluation and Treatment: Rehabilitation  ONSET DATE: 01/10/24  SUBJECTIVE:  SUBJECTIVE STATEMENT: Patient reports that she overdid it over the weekend. Has had a lot of pain last night and has continued pain and tightness in the R shoulder and neck area. Working on her exercises at home.    EVAL: Patient reports history of chronic  R neck and upper trap pain which have been present for the past 5 + years. She had some PT in the past with some improvement but continued to have pain and tightness. Symptoms have increased in the past 3-4 months with no known injury or accident. She describes symptoms as pain and tension in the R trap.   Hand dominance: Right  PERTINENT HISTORY:  MI 2023; valvular disease; takotsubo cardiomyopathy; sleep apnea; AODM; arthritis; anxiety; depression; herniorrhaphy; carpal tunnel surgery L 2024; carpal tunnel R    PAIN:  Are you having pain? Yes: NPRS scale: 7/10; in the past week 10/10  Pain location: R cervical and upper trap area  Pain description: tight; aching; shocking  Aggravating factors: tension; bra strap over shoulder; work Relieving factors: meds; sleep  PRECAUTIONS: None   WEIGHT BEARING RESTRICTIONS: No  FALLS:  Has patient fallen in last 6 months? 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  DIAGNOSTIC FINDINGS:  02/12/24: cervical xray - Normal alignment. Moderate C6-C7 disc space narrowing and anterior spurring, mild C5-C6 anterior spurring and disc space narrowing. No evidence of fracture, focal bone abnormality or bony neural foraminal stenosis. No prevertebral soft tissue thickening.   IMPRESSION: Degenerative disc disease at C5-C6 and C6-C7.  PATIENT SURVEYS:  NDI 18/50; 36%  COGNITION: Overall cognitive status: Within functional limits for tasks assessed  SENSATION: Numbness bilat little fingers at night; sometimes numbness in the LE's   POSTURE: rounded shoulders, forward head, increased thoracic kyphosis, and UE in IR at side; scapulae abducted and rotated along the thoracic spine   PALPATION: Significant muscular tightness in R > L pecs; upper trap; leveator; ant/lat/posterior cervical musculature   CERVICAL ROM:   Active ROM A/PROM (deg) eval  Flexion 50  Extension 50  Right lateral flexion 25  Left lateral flexion 25  Right rotation 62 tight   Left rotation 60 pain R   (Blank rows = not tested)  UPPER EXTREMITY ROM: WFL's some tightness at end range elevation bilat   UPPER EXTREMITY MMT:  MMT Right eval Left eval  Shoulder flexion    Shoulder extension    Shoulder abduction    Shoulder adduction    Shoulder extension    Shoulder internal rotation    Shoulder external rotation    Middle trapezius 4+ 5  Lower trapezius 4 5  Elbow flexion    Elbow extension    Wrist flexion    Wrist extension    Wrist ulnar deviation    Wrist radial deviation    Wrist pronation    Wrist supination    Grip strength     (Blank rows = not  tested)  CERVICAL SPECIAL TESTS:  Upper limb tension test (ULTT): Positive, Spurling's test: Negative, and Distraction test:  Negative   [facility] Adult PT Treatment:                                                DATE: 04/05/2024 Therapeutic Exercise: Doorway stretch 3 positions 30 sec  Standing chin tuck 3 sec x 10 Standing scap squeeze 3 sec x 10 Standing scap squeeze with ER 3 sec x 10  Standing W 3 sec x 10 Manual Therapy: Thoracic mobs pt sitting PA and lateral glides Grade II/III STM through cervical and thoracic spine musculature; pecs  Manual cervical traction supine  Cervical PROM and stretch  Neuromuscular re-ed: Working on posture and alignment engaging posterior shoulder girdle musculature UE active movement to release tightness and increase awarenss of muscular tightness through neck and shoulders Self care:   Education re- sleeping positions    [facility] Adult PT Treatment:                                                DATE: 04/01/2024 Therapeutic Exercise: Doorway stretch 3 positions 30 sec  Standing chin tuck 3 sec x 5 Standing scap squeeze 3 sec x 5 Standing scap squeeze with ER 3 sec x 5  Standing W 3 sec x 5  Therapeutic Activity:  Standing scap squeeze with ER red TB 3 sec x 10 x 2   Standing W with red TB 3 sec x 10 x 2  Sitting thoracic extension with coregeous ball 10 sec x 5  Stepping under orange physio ball for shoulder flexion stretch 30 sec x 3  Manual Therapy: STM through cervical and thoracic spine musculature; pecs  Manual cervical traction supine  Cervical PROM and stretch  Neuromuscular re-ed: Working on posture and alignment engaging posterior shoulder girdle musculature UE active movement to release tightness and increase awarenss of muscular tightness through neck and shoulders    [facility] Adult PT Treatment:                                                DATE: 03/29/2024 Therapeutic Exercise: Corner pec stretch Thread the needle --> standing with  foam roller Spine stretch forward with foam roller UT stretch --> sitting on palm --> hand behind back Manual Therapy: Cupping for myofascial decompression --> static/dynamic, shearing, gliding IASTM (R) upper/mid/low traps Neuromuscular re-ed: Low trap setting at wall Median nerve glide Ulnar nerve glide   TREATMENT DATE: 03/27/24 Postural correction and education Suggestions for improved sitting and standing posture and alignment Exercises per HEP Myofacial ball release work standing  PATIENT EDUCATION:  Education details: POC; HEP  Person educated: Patient Education method: Programmer, multimedia, Demonstration, Actor cues, Verbal cues, and Handouts Education comprehension: verbalized understanding, returned demonstration, verbal cues required, tactile cues required, and needs further education  HOME EXERCISE PROGRAM: Access Code: [email or web address] URL: [email or web address] Date: 04/05/2024 Prepared by: Anessa Charley  Exercises - Seated Cervical Retraction  - 2 x daily - 7 x weekly - 1-2 sets - 5-10 reps - 10 sec  hold - Supine Cervical Retraction with Towel  - 2 x daily - 7 x weekly - 1 sets - 5-10 reps - 10 sec  hold - Seated Scapular Retraction  - 2 x daily - 7 x weekly - 1-2 sets - 10 reps - 10 sec  hold - Supine Scapular Retraction  - 2 x daily - 7 x weekly - 1 sets - 10 reps - 5-10 sec  hold - Shoulder External Rotation and Scapular Retraction  - 3 x daily - 7 x weekly - 1 sets - 10 reps - 3-5 sec   hold - Shoulder External Rotation in 45 Degrees Abduction  - 2 x daily - 7 x weekly - 1-2 sets - 10 reps - 3 sec  hold - Doorway Pec Stretch at 60 Degrees Abduction  - 3 x daily - 7 x weekly - 1 sets - 3 reps - Doorway Pec Stretch at 90 Degrees Abduction  - 3 x daily - 7 x weekly - 1 sets - 3 reps - 30 seconds  hold - Doorway Pec Stretch at 120  Degrees Abduction  - 3 x daily - 7 x weekly - 1 sets - 3 reps - 30 second hold  hold - Standing Median Nerve Glide  - 1-2 x daily - 7 x weekly - 1 sets - 10 reps - Standing Ulnar Nerve Glide  - 1-2 x daily - 7 x weekly - 1 sets - 10 reps - Upper Trapezius Stretch  - 3 x daily - 7 x weekly - 1 sets - 3-5 reps - 30 sec hold - Shoulder External Rotation and Scapular Retraction with Resistance  - 2 x daily - 7 x weekly - 1 sets - 10 reps - 3-5 sec  hold - Shoulder W - External Rotation with Resistance  - 2 x daily - 7 x weekly - 1-2 sets - 10 reps - 3 sec  hold - Seated Thoracic Lumbar Extension with Pectoralis Stretch  - 2 x daily - 7 x weekly - 1 sets - 5-8 reps - 10 sec  hold - Seated Cervical Sidebending Stretch  - 2 x daily - 7 x weekly - 1 sets - 3-5 reps - 5-10 sec  hold  Patient Education - Office Posture  ASSESSMENT:  CLINICAL IMPRESSION: Patient reports significant flare up of symptoms from activities over the weekend. She decorated for her nephews prom and then had a birthday dinner for nephew yesterday. She has had pain yesterday and today. Has not worked on exercises. Treatment consisted of stretching through pecs and mobilization of cervical/thoracic spine as well as posterior shoulder girdle strengthening. Continued with manual work including passive cervical stretching. Good response to treatment.     EVAL: history of chronic cervical and R upper quadrant pain and tightness with intermittent tightness and numbness into the R arm and bilat little fingers at night. She has poor posture and alignment; limited cervical ROM; pain with cervical ROM ; postural weakness; intermittent UE radicular symptoms; decreased functional activity tolerance including difficulty sleeping.  OBJECTIVE IMPAIRMENTS: decreased activity tolerance, decreased ROM, decreased strength, increased fascial restrictions, increased muscle spasms, impaired UE functional use, improper body mechanics, postural  dysfunction, and pain.   GOALS: Goals reviewed with patient? Yes  SHORT TERM GOALS: Target date: 04/15/2024   Independent in initial HEP  Baseline:  Goal status: INITIAL  2.  Decrease muscular tightness and pain by 25% allowing patient to sleep 3-4 hours without awakening due to pain  Baseline:  Goal status: INITIAL  LONG TERM GOALS: Target date: 05/06/2024   Decrease R cervical and upper trap tightness and pain by 75-80% allowing patient to improve functional level with work, leisure, sleep Baseline:  Goal status: INITIAL  2.  Increase cervical ROM by 10-12 degrees in lateral flexion and 5-7 degrees in rotation  Baseline:  Goal status: INITIAL  3.  Improve posture and alignment with patient to demonstrate improved tissue extensibility through anterior chest/pecs and increased muscular engagement in posterior shoulder girdle  Baseline:  Goal status: INITIAL  4.  Patient to demonstrate and verbalize proper posture and alignment for sitting; sleeping; work tasks Baseline:  Goal status: INITIAL  5.  Independent in HEP including aquatic program as indicated  Baseline:  Goal status: INITIAL  6.  Improve NDI by 10 points  Baseline: 18/50; 36%  Goal status: INITIAL   PLAN:  PT FREQUENCY: 2x/week  PT DURATION: 6 weeks  PLANNED INTERVENTIONS: 97110-Therapeutic exercises, 97530- Therapeutic activity, 97112- Neuromuscular re-education, 97535- Self Care, 16109- Manual therapy, Patient/Family education, Taping, Dry Needling, and Joint mobilization  PLAN FOR NEXT SESSION: review and progress exercises; continue with spine care education; postural correction; manual work and modalities as indicated    Elynn Patteson P Noe Pittsley, PT 04/05/2024, 2:04 PM

## 2024-04-07 ENCOUNTER — Ambulatory Visit

## 2024-04-07 DIAGNOSIS — M6281 Muscle weakness (generalized): Secondary | ICD-10-CM

## 2024-04-07 DIAGNOSIS — M79642 Pain in left hand: Secondary | ICD-10-CM

## 2024-04-07 DIAGNOSIS — M542 Cervicalgia: Secondary | ICD-10-CM

## 2024-04-07 DIAGNOSIS — M25642 Stiffness of left hand, not elsewhere classified: Secondary | ICD-10-CM

## 2024-04-07 DIAGNOSIS — R29898 Other symptoms and signs involving the musculoskeletal system: Secondary | ICD-10-CM

## 2024-04-07 NOTE — Patient Instructions (Signed)

## 2024-04-07 NOTE — Therapy (Signed)
 OUTPATIENT PHYSICAL THERAPY CERVICAL TREATMENT   Patient Name: Alicia Booth MRN: [id number] DOB:1971/02/22, 53 y.o., female Today's Date: 04/07/2024  END OF SESSION:  PT End of Session - 04/07/24 1402     Visit Number 5    Number of Visits 12    Date for PT Re-Evaluation 05/06/24    Authorization Type aetna no copay    Authorization - Visit Number 5    Authorization - Number of Visits 60    PT Start Time 1404    PT Stop Time 1453    PT Time Calculation (min) 49 min    Activity Tolerance Patient tolerated treatment well    Behavior During Therapy WFL for tasks assessed/performed            Past Medical History:  Diagnosis Date   Diabetes (HCC)    High cholesterol    Hypertension    Stress-induced cardiomyopathy    Past Surgical History:  Procedure Laterality Date   BARIATRIC SURGERY  2014   BREAST BIOPSY Left    lymph node bx benign   HEEL SPUR EXCISION     LEFT HEART CATH AND CORONARY ANGIOGRAPHY N/A 04/11/2022   Procedure: LEFT HEART CATH AND CORONARY ANGIOGRAPHY;  Surgeon: Lucendia Rusk, MD;  Location: [facility];  Service: Cardiovascular;  Laterality: N/A;   MENISCUS REPAIR     tummy tuck     Patient Active Problem List   Diagnosis Date Noted   Other fatigue 07/25/2023   Seborrheic dermatitis of scalp 05/22/2023   Flexural atopic dermatitis 05/22/2023   Chronic diastolic congestive heart failure (HCC) 01/24/2023   Chronic vaginitis 10/24/2022   Hyperlipidemia associated with type 2 diabetes mellitus (HCC) 10/24/2022   Chronic tension-type headache, not intractable 05/23/2022   Trigger finger, left little finger 05/23/2022   Takotsubo cardiomyopathy 04/24/2022   S/P gastric sleeve procedure 04/24/2022   Adjustment disorder with mixed anxiety and depressed mood 04/24/2022   Perimenopausal symptom 04/24/2022   NSTEMI (non-ST elevated myocardial infarction) (HCC) 04/11/2022   Myopia with astigmatism and presbyopia, bilateral 03/19/2018    Obstructive sleep apnea (adult) (pediatric) 10/13/2012   Diabetes (HCC) 11/16/2009   Hypertension 11/16/2009    PCP: Nikki Barters, NP  REFERRING PROVIDER: Dr Audie Bleacher  REFERRING DIAG: Cervicalgia   THERAPY DIAG:  Cervicalgia  Other symptoms and signs involving the musculoskeletal system  Muscle weakness (generalized)  Pain in left hand  Stiffness of left hand, not elsewhere classified  Rationale for Evaluation and Treatment: Rehabilitation  ONSET DATE: 01/10/24  SUBJECTIVE:  SUBJECTIVE STATEMENT: Patient reports her neck/upper trap is feeling better since Monday, however is still very tight in R shoulder and neck.   EVAL: Patient reports history of chronic  R neck and upper trap pain which have been present for the past 5 + years. She had some PT in the past with some improvement but continued to have pain and tightness. Symptoms have increased in the past 3-4 months with no known injury or accident. She describes symptoms as pain and tension in the R trap.   Hand dominance: Right  PERTINENT HISTORY:  MI 2023; valvular disease; takotsubo cardiomyopathy; sleep apnea; AODM; arthritis; anxiety; depression; herniorrhaphy; carpal tunnel surgery L 2024; carpal tunnel R    PAIN:  Are you having pain? Yes: NPRS scale: 7/10; in the past week 10/10  Pain location: R cervical and upper trap area  Pain description: tight; aching; shocking  Aggravating factors: tension; bra strap over shoulder; work Relieving factors: meds; sleep  PRECAUTIONS: None   WEIGHT BEARING RESTRICTIONS: No  FALLS:  Has patient fallen in last 6 months? 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  DIAGNOSTIC FINDINGS:  02/12/24: cervical xray - Normal alignment. Moderate C6-C7 disc space narrowing and anterior spurring, mild C5-C6 anterior spurring and disc space narrowing. No evidence of fracture, focal bone abnormality or bony neural foraminal stenosis. No prevertebral soft tissue thickening.   IMPRESSION: Degenerative disc disease at C5-C6 and C6-C7.  PATIENT SURVEYS:  NDI 18/50; 36%  COGNITION: Overall cognitive status: Within functional limits for tasks assessed  SENSATION: Numbness bilat little fingers at night; sometimes numbness in the LE's   POSTURE: rounded shoulders, forward head, increased thoracic kyphosis, and UE in IR at side; scapulae abducted and rotated along the thoracic spine   PALPATION: Significant muscular tightness in R > L pecs; upper trap; leveator; ant/lat/posterior cervical musculature   CERVICAL ROM:   Active ROM A/PROM (deg) eval  Flexion 50  Extension 50  Right lateral flexion 25  Left lateral flexion 25  Right rotation 62 tight   Left rotation 60 pain R   (Blank rows = not tested)  UPPER EXTREMITY ROM: WFL's some tightness at end range elevation bilat   UPPER EXTREMITY MMT:  MMT Right eval Left eval  Shoulder flexion    Shoulder extension    Shoulder abduction    Shoulder adduction    Shoulder extension    Shoulder internal rotation    Shoulder external rotation    Middle trapezius 4+ 5  Lower trapezius 4 5  Elbow flexion    Elbow extension    Wrist flexion    Wrist extension    Wrist ulnar deviation    Wrist radial deviation    Wrist pronation    Wrist supination     Grip strength     (Blank rows = not tested)  CERVICAL SPECIAL TESTS:  Upper limb tension test (ULTT): Positive, Spurling's test: Negative, and Distraction test: Negative   [facility] Adult PT Treatment:  DATE: 04/07/2024 Therapeutic Exercise: Pec doorway stretch Supine thoracic extension over blanket roll with tapered edge: Arm raises Scissor arms Hug <--> t arms pec stretch Self-TPR with theracane --> upper traps Manual Therapy: Cupping for myofascial decompression --> static, dynamic, gliding, shearing --> R upper traps, cervical paraspinals, upper thoracic paraspinals Active UT & LS stretches with static cups Self Care: Folded towel for postural support when driving   [facility] Adult PT Treatment:                                                DATE: 04/05/2024 Therapeutic Exercise: Doorway stretch 3 positions 30 sec  Standing chin tuck 3 sec x 10 Standing scap squeeze 3 sec x 10 Standing scap squeeze with ER 3 sec x 10  Standing W 3 sec x 10 Manual Therapy: Thoracic mobs pt sitting PA and lateral glides Grade II/III STM through cervical and thoracic spine musculature; pecs  Manual cervical traction supine  Cervical PROM and stretch  Neuromuscular re-ed: Working on posture and alignment engaging posterior shoulder girdle musculature UE active movement to release tightness and increase awarenss of muscular tightness through neck and shoulders Self care:   Education re- sleeping positions    [facility] Adult PT Treatment:                                                DATE: 04/01/2024 Therapeutic Exercise: Doorway stretch 3 positions 30 sec  Standing chin tuck 3 sec x 5 Standing scap squeeze 3 sec x 5 Standing scap squeeze with ER 3 sec x 5  Standing W 3 sec x 5  Therapeutic Activity:  Standing scap squeeze with ER red TB 3 sec x 10 x 2   Standing W with red TB 3 sec x 10 x 2  Sitting thoracic extension with coregeous ball 10 sec x 5   Stepping under orange physio ball for shoulder flexion stretch 30 sec x 3  Manual Therapy: STM through cervical and thoracic spine musculature; pecs  Manual cervical traction supine  Cervical PROM and stretch  Neuromuscular re-ed: Working on posture and alignment engaging posterior shoulder girdle musculature UE active movement to release tightness and increase awarenss of muscular tightness through neck and shoulders     PATIENT EDUCATION:  Education details: DN handout Person educated: Patient Education method: Programmer, multimedia, Demonstration, Actor cues, Verbal cues, and Handouts Education comprehension: verbalized understanding, returned demonstration, verbal cues required, tactile cues required, and needs further education  HOME EXERCISE PROGRAM: Access Code: [email or web address] URL: [email or web address] Date: 04/05/2024 Prepared by: Celyn Holt  Exercises - Seated Cervical Retraction  - 2 x daily - 7 x weekly - 1-2 sets - 5-10 reps - 10 sec  hold - Supine Cervical Retraction with Towel  - 2 x daily - 7 x weekly - 1 sets - 5-10 reps - 10 sec  hold - Seated Scapular Retraction  - 2 x daily - 7 x weekly - 1-2 sets - 10 reps - 10 sec  hold - Supine Scapular Retraction  - 2 x daily - 7 x weekly - 1 sets - 10 reps - 5-10 sec  hold - Shoulder External Rotation and Scapular Retraction  - 3 x daily - 7  x weekly - 1 sets - 10 reps - 3-5 sec   hold - Shoulder External Rotation in 45 Degrees Abduction  - 2 x daily - 7 x weekly - 1-2 sets - 10 reps - 3 sec  hold - Doorway Pec Stretch at 60 Degrees Abduction  - 3 x daily - 7 x weekly - 1 sets - 3 reps - Doorway Pec Stretch at 90 Degrees Abduction  - 3 x daily - 7 x weekly - 1 sets - 3 reps - 30 seconds  hold - Doorway Pec Stretch at 120 Degrees Abduction  - 3 x daily - 7 x weekly - 1 sets - 3 reps - 30 second hold  hold - Standing Median Nerve Glide  - 1-2 x daily - 7 x weekly - 1 sets - 10 reps - Standing Ulnar Nerve Glide  - 1-2 x daily  - 7 x weekly - 1 sets - 10 reps - Upper Trapezius Stretch  - 3 x daily - 7 x weekly - 1 sets - 3-5 reps - 30 sec hold - Shoulder External Rotation and Scapular Retraction with Resistance  - 2 x daily - 7 x weekly - 1 sets - 10 reps - 3-5 sec  hold - Shoulder W - External Rotation with Resistance  - 2 x daily - 7 x weekly - 1-2 sets - 10 reps - 3 sec  hold - Seated Thoracic Lumbar Extension with Pectoralis Stretch  - 2 x daily - 7 x weekly - 1 sets - 5-8 reps - 10 sec  hold - Seated Cervical Sidebending Stretch  - 2 x daily - 7 x weekly - 1 sets - 3-5 reps - 5-10 sec  hold  Patient Education - Office Posture  ASSESSMENT:  CLINICAL IMPRESSION:  Session focused on decreasing myofascial tension in trapezius musculature on R side with static and dynamic cupping techniques for fascial decompression. Thoracic extension exercise incorporated to promote upright postural alignment and decrease forward shoulder posture. Information handout on dry needling provided for patient in preparation for treatment intervention at next PT visit.   EVAL: history of chronic cervical and R upper quadrant pain and tightness with intermittent tightness and numbness into the R arm and bilat little fingers at night. She has poor posture and alignment; limited cervical ROM; pain with cervical ROM ; postural weakness; intermittent UE radicular symptoms; decreased functional activity tolerance including difficulty sleeping.    OBJECTIVE IMPAIRMENTS: decreased activity tolerance, decreased ROM, decreased strength, increased fascial restrictions, increased muscle spasms, impaired UE functional use, improper body mechanics, postural dysfunction, and pain.   GOALS: Goals reviewed with patient? Yes  SHORT TERM GOALS: Target date: 04/15/2024  Independent in initial HEP  Baseline:  Goal status: INITIAL  2.  Decrease muscular tightness and pain by 25% allowing patient to sleep 3-4 hours without awakening due to pain  Baseline:   Goal status: INITIAL  LONG TERM GOALS: Target date: 05/06/2024  Decrease R cervical and upper trap tightness and pain by 75-80% allowing patient to improve functional level with work, leisure, sleep Baseline:  Goal status: INITIAL  2.  Increase cervical ROM by 10-12 degrees in lateral flexion and 5-7 degrees in rotation  Baseline:  Goal status: INITIAL  3.  Improve posture and alignment with patient to demonstrate improved tissue extensibility through anterior chest/pecs and increased muscular engagement in posterior shoulder girdle  Baseline:  Goal status: INITIAL  4.  Patient to demonstrate and verbalize proper posture and alignment for  sitting; sleeping; work tasks Baseline:  Goal status: INITIAL  5.  Independent in HEP including aquatic program as indicated  Baseline:  Goal status: INITIAL  6.  Improve NDI by 10 points  Baseline: 18/50; 36%  Goal status: INITIAL   PLAN:  PT FREQUENCY: 2x/week  PT DURATION: 6 weeks  PLANNED INTERVENTIONS: 97110-Therapeutic exercises, 97530- Therapeutic activity, 97112- Neuromuscular re-education, 97535- Self Care, 16109- Manual therapy, Patient/Family education, Taping, Dry Needling, and Joint mobilization  PLAN FOR NEXT SESSION: Dry Needling next visit. Review and progress exercises; continue with spine care education; postural correction; manual work and modalities as indicated    Flint Hummer, PTA 04/07/2024, 2:55 PM

## 2024-04-08 ENCOUNTER — Encounter: Admitting: Rehabilitative and Restorative Service Providers"

## 2024-04-12 ENCOUNTER — Ambulatory Visit: Attending: Neurosurgery | Admitting: Rehabilitative and Restorative Service Providers"

## 2024-04-12 ENCOUNTER — Encounter: Payer: Self-pay | Admitting: Rehabilitative and Restorative Service Providers"

## 2024-04-12 DIAGNOSIS — M542 Cervicalgia: Secondary | ICD-10-CM | POA: Insufficient documentation

## 2024-04-12 DIAGNOSIS — R29898 Other symptoms and signs involving the musculoskeletal system: Secondary | ICD-10-CM | POA: Insufficient documentation

## 2024-04-12 DIAGNOSIS — M79642 Pain in left hand: Secondary | ICD-10-CM | POA: Diagnosis present

## 2024-04-12 DIAGNOSIS — M6281 Muscle weakness (generalized): Secondary | ICD-10-CM | POA: Diagnosis present

## 2024-04-12 DIAGNOSIS — M25642 Stiffness of left hand, not elsewhere classified: Secondary | ICD-10-CM | POA: Diagnosis present

## 2024-04-12 NOTE — Patient Instructions (Signed)

## 2024-04-12 NOTE — Therapy (Signed)
 OUTPATIENT PHYSICAL THERAPY CERVICAL TREATMENT   Patient Name: Alicia Booth MRN: [id number] DOB:08/19/1971, 53 y.o., female Today's Date: 04/12/2024  END OF SESSION:  PT End of Session - 04/12/24 1405     Visit Number 6    Number of Visits 12    Date for PT Re-Evaluation 05/06/24    Authorization Type [employer]etna no copay    Authorization - Number of Visits 60    PT Start Time 1406    PT Stop Time 1458    PT Time Calculation (min) 52 min    Activity Tolerance Patient tolerated treatment well    Behavior During Therapy WFL for tasks assessed/performed             Past Medical History:  Diagnosis Date   Diabetes (HCC)    High cholesterol    Hypertension    Stress-induced cardiomyopathy    Past Surgical History:  Procedure Laterality Date   BARIATRIC SURGERY  2014   BREAST BIOPSY Left    lymph node bx benign   HEEL SPUR EXCISION     LEFT HEART CATH AND CORONARY ANGIOGRAPHY N/A 04/11/2022   Procedure: LEFT HEART CATH AND CORONARY ANGIOGRAPHY;  Surgeon: Lucendia Rusk, MD;  Location: [facility];  Service: Cardiovascular;  Laterality: N/A;   MENISCUS REPAIR     tummy tuck     Patient Active Problem List   Diagnosis Date Noted   Other fatigue 07/25/2023   Seborrheic dermatitis of scalp 05/22/2023   Flexural atopic dermatitis 05/22/2023   Chronic diastolic congestive heart failure (HCC) 01/24/2023   Chronic vaginitis 10/24/2022   Hyperlipidemia associated with type 2 diabetes mellitus (HCC) 10/24/2022   Chronic tension-type headache, not intractable 05/23/2022   Trigger finger, left little finger 05/23/2022   Takotsubo cardiomyopathy 04/24/2022   S/P gastric sleeve procedure 04/24/2022   Adjustment disorder with mixed anxiety and depressed mood 04/24/2022   Perimenopausal symptom 04/24/2022   NSTEMI (non-ST elevated myocardial infarction) (HCC) 04/11/2022   Myopia with astigmatism and presbyopia, bilateral 03/19/2018   Obstructive sleep apnea (adult)  (pediatric) 10/13/2012   Diabetes (HCC) 11/16/2009   Hypertension 11/16/2009    PCP: Nikki Barters, NP REFERRING PROVIDER: Dr Audie Bleacher REFERRING DIAG: Cervicalgia  THERAPY DIAG:  Cervicalgia  Other symptoms and signs involving the musculoskeletal system  Muscle weakness (generalized)  Rationale for Evaluation and Treatment: Rehabilitation  ONSET DATE: 01/10/24  SUBJECTIVE:  SUBJECTIVE STATEMENT: "The cupping helps a lot". She reports she had a busy weekend last weekend with cooking, and this weekend she went to the beach (pulling stuff on the beach). She takes meds to sleep so the neck/shoulder don't seem to wake her.   EVAL: Patient reports history of chronic  R neck and upper trap pain which have been present for the past 5 + years. She had some PT in the past with some improvement but continued to have pain and tightness. Symptoms have increased in the past 3-4 months with no known injury or accident. She describes symptoms as pain and tension in the R trap.   Hand dominance: Right  PERTINENT HISTORY:  MI 2023; valvular disease; takotsubo cardiomyopathy; sleep apnea; AODM; arthritis; anxiety; depression; herniorrhaphy; carpal tunnel surgery L 2024; carpal tunnel R; h/o barriatric surgery   PAIN:  Are you having pain? Yes: NPRS scale: 2/10 today Pain location: R cervical and upper trap area  Pain description: tight; aching; shocking  Aggravating factors: tension; bra strap over shoulder; work Relieving factors: meds; sleep  PRECAUTIONS: None   WEIGHT BEARING RESTRICTIONS: No  FALLS:  Has patient fallen in last 6 months? 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  DIAGNOSTIC FINDINGS:  02/12/24: cervical xray - Normal alignment. Moderate C6-C7 disc space narrowing and anterior spurring, mild C5-C6 anterior spurring and disc space narrowing. No evidence of fracture, focal bone abnormality or bony neural foraminal stenosis. No prevertebral soft tissue thickening.   IMPRESSION: Degenerative disc disease at C5-C6 and C6-C7.  PATIENT SURVEYS:  NDI 18/50; 36%  COGNITION: Overall cognitive status: Within functional limits for tasks assessed  SENSATION: Numbness bilat little fingers at night; sometimes numbness in the LE's   POSTURE: rounded shoulders, forward head, increased thoracic kyphosis, and UE in IR at side; scapulae abducted and rotated along the thoracic spine   PALPATION: Significant muscular tightness in R > L pecs; upper trap; leveator; ant/lat/posterior cervical musculature   CERVICAL ROM:  Active ROM A/PROM (deg) eval  Flexion 50  Extension 50  Right lateral flexion 25  Left lateral flexion 25  Right rotation 62 tight   Left rotation 60 pain R   (Blank rows = not tested)  UPPER EXTREMITY ROM: WFL's some tightness at end range elevation bilat   UPPER EXTREMITY MMT: MMT Right eval Left eval  Shoulder flexion    Shoulder extension    Shoulder abduction    Shoulder adduction    Shoulder extension    Shoulder internal rotation    Shoulder external rotation    Middle trapezius 4+ 5  Lower trapezius 4 5  Elbow flexion    Elbow extension    Wrist flexion    Wrist extension    Wrist ulnar deviation    Wrist radial deviation    Wrist pronation    Wrist supination    Grip  strength     (Blank rows = not tested)  CERVICAL SPECIAL TESTS:  Upper limb tension test (ULTT): Positive, Spurling's test: Negative, and Distraction test: Negative  [facility] Adult PT Treatment:                                                DATE: 04/12/24 Therapeutic Exercise: Standing Wall lean with thoracic opening R and L sides Wall scapular retraction/protraction x 10 reps Pec stretch with arms at 70, 90, 120 Manual Therapy: STM bilat upper trap, bilat levator, rhomboids, cervical multifidi Trigger Point Dry Needling Initial Treatment: Pt instructed on Dry Needling rational, procedures, and possible side effects. Pt instructed to expect mild to moderate muscle soreness later in the day and/or into the next day.  Pt instructed in methods to reduce muscle soreness. Pt instructed to continue prescribed HEP. Because Dry Needling was performed over or adjacent to a lung field, pt was educated on S/S of pneumothorax and to seek immediate medical attention should they occur.  Patient verbalized understanding of these instructions and education.  Patient Verbal Consent Given: Yes Education Handout Provided: Yes Muscles Treated: R upper trapezius, R levator, R C6 cervical multifidi Electrical Stimulation Performed: No Treatment Response/Outcome: palpable lengthening, muscle twitch, *patient notes some discomfort anterior R shoulder after TPDN  Modalities: Moist hot pack x 10 minutes upper trap, R anterior shoulder  [facility] Adult PT Treatment:                                                DATE: 04/07/2024 Therapeutic Exercise: Pec doorway stretch Supine thoracic extension over blanket roll with tapered edge: Arm raises Scissor arms Hug <--> t arms pec stretch Self-TPR with theracane --> upper traps Manual Therapy: Cupping for myofascial decompression --> static, dynamic, gliding, shearing --> R upper traps, cervical paraspinals, upper thoracic paraspinals Active UT & LS stretches with  static cups Self Care: Folded towel for postural support when driving   [facility] Adult PT Treatment:                                                DATE: 04/05/2024 Therapeutic Exercise: Doorway stretch 3 positions 30 sec  Standing chin tuck 3 sec x 10 Standing scap squeeze 3 sec x 10 Standing scap squeeze with ER 3 sec x 10  Standing W 3 sec x 10 Manual Therapy: Thoracic mobs pt sitting PA and lateral glides Grade II/III STM through cervical and thoracic spine musculature; pecs  Manual cervical traction supine  Cervical PROM and stretch  Neuromuscular re-ed: Working on posture and alignment engaging posterior shoulder girdle musculature UE active movement to release tightness and increase awarenss of muscular tightness through neck and shoulders Self care:   Education re- sleeping positions    [facility] Adult PT Treatment:                                                DATE: 04/01/2024 Therapeutic Exercise: Doorway stretch 3 positions 30 sec  Standing chin tuck 3 sec x 5 Standing scap squeeze 3 sec x 5 Standing scap squeeze with ER 3 sec x  5  Standing W 3 sec x 5  Therapeutic Activity:  Standing scap squeeze with ER red TB 3 sec x 10 x 2   Standing W with red TB 3 sec x 10 x 2  Sitting thoracic extension with coregeous ball 10 sec x 5  Stepping under orange physio ball for shoulder flexion stretch 30 sec x 3  Manual Therapy: STM through cervical and thoracic spine musculature; pecs  Manual cervical traction supine  Cervical PROM and stretch  Neuromuscular re-ed: Working on posture and alignment engaging posterior shoulder girdle musculature UE active movement to release tightness and increase awarenss of muscular tightness through neck and shoulders     PATIENT EDUCATION:  Education details: DN handout Person educated: Patient Education method: Programmer, multimedia, Demonstration, Actor cues, Verbal cues, and Handouts Education comprehension: verbalized understanding, returned  demonstration, verbal cues required, tactile cues required, and needs further education  HOME EXERCISE PROGRAM: Access Code: [email or web address] URL: [email or web address] Date: 04/05/2024 Prepared by: Celyn Holt  Exercises - Seated Cervical Retraction  - 2 x daily - 7 x weekly - 1-2 sets - 5-10 reps - 10 sec  hold - Supine Cervical Retraction with Towel  - 2 x daily - 7 x weekly - 1 sets - 5-10 reps - 10 sec  hold - Seated Scapular Retraction  - 2 x daily - 7 x weekly - 1-2 sets - 10 reps - 10 sec  hold - Supine Scapular Retraction  - 2 x daily - 7 x weekly - 1 sets - 10 reps - 5-10 sec  hold - Shoulder External Rotation and Scapular Retraction  - 3 x daily - 7 x weekly - 1 sets - 10 reps - 3-5 sec   hold - Shoulder External Rotation in 45 Degrees Abduction  - 2 x daily - 7 x weekly - 1-2 sets - 10 reps - 3 sec  hold - Doorway Pec Stretch at 60 Degrees Abduction  - 3 x daily - 7 x weekly - 1 sets - 3 reps - Doorway Pec Stretch at 90 Degrees Abduction  - 3 x daily - 7 x weekly - 1 sets - 3 reps - 30 seconds  hold - Doorway Pec Stretch at 120 Degrees Abduction  - 3 x daily - 7 x weekly - 1 sets - 3 reps - 30 second hold  hold - Standing Median Nerve Glide  - 1-2 x daily - 7 x weekly - 1 sets - 10 reps - Standing Ulnar Nerve Glide  - 1-2 x daily - 7 x weekly - 1 sets - 10 reps - Upper Trapezius Stretch  - 3 x daily - 7 x weekly - 1 sets - 3-5 reps - 30 sec hold - Shoulder External Rotation and Scapular Retraction with Resistance  - 2 x daily - 7 x weekly - 1 sets - 10 reps - 3-5 sec  hold - Shoulder W - External Rotation with Resistance  - 2 x daily - 7 x weekly - 1-2 sets - 10 reps - 3 sec  hold - Seated Thoracic Lumbar Extension with Pectoralis Stretch  - 2 x daily - 7 x weekly - 1 sets - 5-8 reps - 10 sec  hold - Seated Cervical Sidebending Stretch  - 2 x daily - 7 x weekly - 1 sets - 3-5 reps - 5-10 sec  hold  Patient Education - Office Posture  ASSESSMENT:  CLINICAL  IMPRESSION:  The patient is doing a portion of HEP-- the  standing ones are easier b/c she can do them as she gets up from her desk from working. PT added TPDN with instructions provided today over R cervical and trap musculature. Patient has referred pain during DN that is proximal to where therapist is DN and after DN she has R anterior pain (over [facility] joint). Patient has significant muscle tightness that appears chronic in nature.   EVAL: history of chronic cervical and R upper quadrant pain and tightness with intermittent tightness and numbness into the R arm and bilat little fingers at night. She has poor posture and alignment; limited cervical ROM; pain with cervical ROM ; postural weakness; intermittent UE radicular symptoms; decreased functional activity tolerance including difficulty sleeping.   OBJECTIVE IMPAIRMENTS: decreased activity tolerance, decreased ROM, decreased strength, increased fascial restrictions, increased muscle spasms, impaired UE functional use, improper body mechanics, postural dysfunction, and pain.   GOALS: Goals reviewed with patient? Yes  SHORT TERM GOALS: Target date: 04/15/2024  Independent in initial HEP  Baseline:  Goal status: INITIAL  2.  Decrease muscular tightness and pain by 25% allowing patient to sleep 3-4 hours without awakening due to pain  Baseline:  Goal status: INITIAL  LONG TERM GOALS: Target date: 05/06/2024  Decrease R cervical and upper trap tightness and pain by 75-80% allowing patient to improve functional level with work, leisure, sleep Baseline:  Goal status: INITIAL  2.  Increase cervical ROM by 10-12 degrees in lateral flexion and 5-7 degrees in rotation  Baseline:  Goal status: INITIAL  3.  Improve posture and alignment with patient to demonstrate improved tissue extensibility through anterior chest/pecs and increased muscular engagement in posterior shoulder girdle  Baseline:  Goal status: INITIAL  4.  Patient to demonstrate  and verbalize proper posture and alignment for sitting; sleeping; work tasks Baseline:  Goal status: INITIAL  5.  Independent in HEP including aquatic program as indicated  Baseline:  Goal status: INITIAL  6.  Improve NDI by 10 points  Baseline: 18/50; 36%  Goal status: INITIAL   PLAN:  PT FREQUENCY: 2x/week  PT DURATION: 6 weeks  PLANNED INTERVENTIONS: 97110-Therapeutic exercises, 97530- Therapeutic activity, 97112- Neuromuscular re-education, 97535- Self Care, 16109- Manual therapy, Patient/Family education, Taping, Dry Needling, and Joint mobilization  PLAN FOR NEXT SESSION: Check on response to TPDN. Review and progress exercises; continue with spine care education; postural correction; manual work and modalities as indicated    Kenson Groh, PT 04/12/2024, 2:54 PM

## 2024-04-15 ENCOUNTER — Ambulatory Visit

## 2024-04-15 DIAGNOSIS — M6281 Muscle weakness (generalized): Secondary | ICD-10-CM

## 2024-04-15 DIAGNOSIS — M542 Cervicalgia: Secondary | ICD-10-CM | POA: Diagnosis not present

## 2024-04-15 DIAGNOSIS — R29898 Other symptoms and signs involving the musculoskeletal system: Secondary | ICD-10-CM

## 2024-04-15 NOTE — Therapy (Signed)
 OUTPATIENT PHYSICAL THERAPY CERVICAL TREATMENT   Patient Name: Alicia Booth MRN: [id number] DOB:03-30-1971, 53 y.o., female Today's Date: 04/15/2024  END OF SESSION:  PT End of Session - 04/15/24 0800     Visit Number 7    Number of Visits 12    Date for PT Re-Evaluation 05/06/24    Authorization Type [employer]etna no copay    Authorization - Visit Number 7    Authorization - Number of Visits 60    PT Start Time 0800    PT Stop Time 0845    PT Time Calculation (min) 45 min    Activity Tolerance Patient tolerated treatment well    Behavior During Therapy WFL for tasks assessed/performed            Past Medical History:  Diagnosis Date   Diabetes (HCC)    High cholesterol    Hypertension    Stress-induced cardiomyopathy    Past Surgical History:  Procedure Laterality Date   BARIATRIC SURGERY  2014   BREAST BIOPSY Left    lymph node bx benign   HEEL SPUR EXCISION     LEFT HEART CATH AND CORONARY ANGIOGRAPHY N/A 04/11/2022   Procedure: LEFT HEART CATH AND CORONARY ANGIOGRAPHY;  Surgeon: Lucendia Rusk, MD;  Location: [facility];  Service: Cardiovascular;  Laterality: N/A;   MENISCUS REPAIR     tummy tuck     Patient Active Problem List   Diagnosis Date Noted   Other fatigue 07/25/2023   Seborrheic dermatitis of scalp 05/22/2023   Flexural atopic dermatitis 05/22/2023   Chronic diastolic congestive heart failure (HCC) 01/24/2023   Chronic vaginitis 10/24/2022   Hyperlipidemia associated with type 2 diabetes mellitus (HCC) 10/24/2022   Chronic tension-type headache, not intractable 05/23/2022   Trigger finger, left little finger 05/23/2022   Takotsubo cardiomyopathy 04/24/2022   S/P gastric sleeve procedure 04/24/2022   Adjustment disorder with mixed anxiety and depressed mood 04/24/2022   Perimenopausal symptom 04/24/2022   NSTEMI (non-ST elevated myocardial infarction) (HCC) 04/11/2022   Myopia with astigmatism and presbyopia, bilateral 03/19/2018    Obstructive sleep apnea (adult) (pediatric) 10/13/2012   Diabetes (HCC) 11/16/2009   Hypertension 11/16/2009    PCP: Nikki Barters, NP REFERRING PROVIDER: Dr Audie Bleacher REFERRING DIAG: Cervicalgia  THERAPY DIAG:  Cervicalgia  Other symptoms and signs involving the musculoskeletal system  Muscle weakness (generalized)  Rationale for Evaluation and Treatment: Rehabilitation  ONSET DATE: 01/10/24  SUBJECTIVE:  SUBJECTIVE STATEMENT: Patient reports she was most sore at proximal neck from dry needling last visit; states she has had less pain but still tight, states she has been able to sleep without taking sleep aides.   EVAL: Patient reports history of chronic  R neck and upper trap pain which have been present for the past 5 + years. She had some PT in the past with some improvement but continued to have pain and tightness. Symptoms have increased in the past 3-4 months with no known injury or accident. She describes symptoms as pain and tension in the R trap.   Hand dominance: Right  PERTINENT HISTORY:  MI 2023; valvular disease; takotsubo cardiomyopathy; sleep apnea; AODM; arthritis; anxiety; depression; herniorrhaphy; carpal tunnel surgery L 2024; carpal tunnel R; h/o barriatric surgery   PAIN:  Are you having pain? Yes: NPRS scale: 2/10 today Pain location: R cervical and upper trap area  Pain description: tight; aching; shocking  Aggravating factors: tension; bra strap over shoulder; work Relieving factors: meds; sleep  PRECAUTIONS: None   WEIGHT BEARING RESTRICTIONS: No  FALLS:  Has patient fallen in last 6 months? 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  DIAGNOSTIC FINDINGS:  02/12/24: cervical xray - Normal alignment. Moderate C6-C7 disc space narrowing and anterior spurring, mild C5-C6 anterior spurring and disc space narrowing. No evidence of fracture, focal bone abnormality or bony neural foraminal stenosis. No prevertebral soft tissue thickening.   IMPRESSION: Degenerative disc disease at C5-C6 and C6-C7.  PATIENT SURVEYS:  NDI 18/50; 36%  COGNITION: Overall cognitive status: Within functional limits for tasks assessed  SENSATION: Numbness bilat little fingers at night; sometimes numbness in the LE's   POSTURE: rounded shoulders, forward head, increased thoracic kyphosis, and UE in IR at side; scapulae abducted and rotated along the thoracic spine   PALPATION: Significant muscular tightness in R > L pecs; upper trap; leveator; ant/lat/posterior cervical musculature   CERVICAL ROM:  Active ROM A/PROM (deg) eval  Flexion 50  Extension 50  Right lateral flexion 25  Left lateral flexion 25  Right rotation 62 tight   Left rotation 60 pain R   (Blank rows = not tested)  UPPER EXTREMITY ROM: WFL's some tightness at end range elevation bilat   UPPER EXTREMITY MMT: MMT Right eval Left eval  Shoulder flexion    Shoulder extension    Shoulder abduction    Shoulder adduction    Shoulder extension    Shoulder internal rotation    Shoulder external rotation    Middle trapezius 4+ 5  Lower trapezius 4 5  Elbow flexion    Elbow extension    Wrist flexion    Wrist extension    Wrist ulnar deviation    Wrist radial deviation    Wrist pronation    Wrist supination    Grip strength      (Blank rows = not tested)  CERVICAL SPECIAL TESTS:  Upper limb tension test (ULTT): Positive, Spurling's test: Negative, and Distraction test: Negative   [facility] Adult PT Treatment:  DATE: 04/15/2024 Therapeutic Exercise: Side stretch over green bolster + ribcage breathing Manual Therapy: Cupping for myofascial decompression --> static, dynamic, gliding, shearing --> R upper traps, cervical paraspinals, upper thoracic paraspinals Active UT & LS stretches with static cups Gentle cupping along lateral incisions  Self Care: Scar tissue massage   [facility] Adult PT Treatment:                                                DATE: 04/12/24 Therapeutic Exercise: Standing Wall lean with thoracic opening R and L sides Wall scapular retraction/protraction x 10 reps Pec stretch with arms at 70, 90, 120 Manual Therapy: STM bilat upper trap, bilat levator, rhomboids, cervical multifidi Trigger Point Dry Needling Initial Treatment: Pt instructed on Dry Needling rational, procedures, and possible side effects. Pt instructed to expect mild to moderate muscle soreness later in the day and/or into the next day.  Pt instructed in methods to reduce muscle soreness. Pt instructed to continue prescribed HEP. Because Dry Needling was performed over or adjacent to a lung field, pt was educated on S/S of pneumothorax and to seek immediate medical attention should they occur.  Patient verbalized understanding of these instructions and education.  Patient Verbal Consent Given: Yes Education Handout Provided: Yes Muscles Treated: R upper trapezius, R levator, R C6 cervical multifidi Electrical Stimulation Performed: No Treatment Response/Outcome: palpable lengthening, muscle twitch, *patient notes some discomfort anterior R shoulder after TPDN  Modalities: Moist hot pack x 10 minutes upper trap, R anterior shoulder  [facility] Adult PT Treatment:                                                 DATE: 04/07/2024 Therapeutic Exercise: Pec doorway stretch Supine thoracic extension over blanket roll with tapered edge: Arm raises Scissor arms Hug <--> t arms pec stretch Self-TPR with theracane --> upper traps Manual Therapy: Cupping for myofascial decompression --> static, dynamic, gliding, shearing --> R upper traps, cervical paraspinals, upper thoracic paraspinals Active UT & LS stretches with static cups Self Care: Folded towel for postural support when driving   PATIENT EDUCATION:  Education details: DN handout Person educated: Patient Education method: Explanation, Demonstration, Tactile cues, Verbal cues, and Handouts Education comprehension: verbalized understanding, returned demonstration, verbal cues required, tactile cues required, and needs further education  HOME EXERCISE PROGRAM: Access Code: [email or web address] URL: [email or web address] Date: 04/05/2024 Prepared by: Celyn Holt  Exercises - Seated Cervical Retraction  - 2 x daily - 7 x weekly - 1-2 sets - 5-10 reps - 10 sec  hold - Supine Cervical Retraction with Towel  - 2 x daily - 7 x weekly - 1 sets - 5-10 reps - 10 sec  hold - Seated Scapular Retraction  - 2 x daily - 7 x weekly - 1-2 sets - 10 reps - 10 sec  hold - Supine Scapular Retraction  - 2 x daily - 7 x weekly - 1 sets - 10 reps - 5-10 sec  hold - Shoulder External Rotation and Scapular Retraction  - 3 x daily - 7 x weekly - 1 sets - 10 reps - 3-5 sec   hold - Shoulder External Rotation in 45 Degrees Abduction  - 2 x daily -  7 x weekly - 1-2 sets - 10 reps - 3 sec  hold - Doorway Pec Stretch at 60 Degrees Abduction  - 3 x daily - 7 x weekly - 1 sets - 3 reps - Doorway Pec Stretch at 90 Degrees Abduction  - 3 x daily - 7 x weekly - 1 sets - 3 reps - 30 seconds  hold - Doorway Pec Stretch at 120 Degrees Abduction  - 3 x daily - 7 x weekly - 1 sets - 3 reps - 30 second hold  hold - Standing Median Nerve Glide  - 1-2 x daily - 7 x  weekly - 1 sets - 10 reps - Standing Ulnar Nerve Glide  - 1-2 x daily - 7 x weekly - 1 sets - 10 reps - Upper Trapezius Stretch  - 3 x daily - 7 x weekly - 1 sets - 3-5 reps - 30 sec hold - Shoulder External Rotation and Scapular Retraction with Resistance  - 2 x daily - 7 x weekly - 1 sets - 10 reps - 3-5 sec  hold - Shoulder W - External Rotation with Resistance  - 2 x daily - 7 x weekly - 1-2 sets - 10 reps - 3 sec  hold - Seated Thoracic Lumbar Extension with Pectoralis Stretch  - 2 x daily - 7 x weekly - 1 sets - 5-8 reps - 10 sec  hold - Seated Cervical Sidebending Stretch  - 2 x daily - 7 x weekly - 1 sets - 3-5 reps - 5-10 sec  hold  Patient Education - Office Posture  ASSESSMENT:  CLINICAL IMPRESSION:  Session focused on myofascial decompression with cupping techniques and IASTM manual intervention. Instructed patient in scar tissue massage and recommended patient add in lateral torso stretches over bolster to address myofascial tightness around lateral incisions.   OBJECTIVE IMPAIRMENTS: decreased activity tolerance, decreased ROM, decreased strength, increased fascial restrictions, increased muscle spasms, impaired UE functional use, improper body mechanics, postural dysfunction, and pain.   GOALS: Goals reviewed with patient? Yes  SHORT TERM GOALS: Target date: 04/15/2024  Independent in initial HEP  Baseline:  Goal status: INITIAL  2.  Decrease muscular tightness and pain by 25% allowing patient to sleep 3-4 hours without awakening due to pain  Baseline:  Goal status: INITIAL  LONG TERM GOALS: Target date: 05/06/2024  Decrease R cervical and upper trap tightness and pain by 75-80% allowing patient to improve functional level with work, leisure, sleep Baseline:  Goal status: INITIAL  2.  Increase cervical ROM by 10-12 degrees in lateral flexion and 5-7 degrees in rotation  Baseline:  Goal status: INITIAL  3.  Improve posture and alignment with patient to demonstrate  improved tissue extensibility through anterior chest/pecs and increased muscular engagement in posterior shoulder girdle  Baseline:  Goal status: INITIAL  4.  Patient to demonstrate and verbalize proper posture and alignment for sitting; sleeping; work tasks Baseline:  Goal status: INITIAL  5.  Independent in HEP including aquatic program as indicated  Baseline:  Goal status: INITIAL  6.  Improve NDI by 10 points  Baseline: 18/50; 36%  Goal status: INITIAL   PLAN:  PT FREQUENCY: 2x/week  PT DURATION: 6 weeks  PLANNED INTERVENTIONS: 97110-Therapeutic exercises, 97530- Therapeutic activity, 97112- Neuromuscular re-education, 97535- Self Care, [id number]- Manual therapy, Patient/Family education, Taping, Dry Needling, and Joint mobilization  PLAN FOR NEXT SESSION: Review and progress exercises; continue with spine care education; postural correction; manual work and modalities as indicated  Flint Hummer, PTA 04/15/2024, 10:02 AM

## 2024-04-20 ENCOUNTER — Encounter: Payer: Self-pay | Admitting: Rehabilitative and Restorative Service Providers"

## 2024-04-20 ENCOUNTER — Other Ambulatory Visit: Payer: Self-pay

## 2024-04-20 ENCOUNTER — Ambulatory Visit: Payer: Self-pay

## 2024-04-20 DIAGNOSIS — M6281 Muscle weakness (generalized): Secondary | ICD-10-CM

## 2024-04-20 DIAGNOSIS — R29898 Other symptoms and signs involving the musculoskeletal system: Secondary | ICD-10-CM

## 2024-04-20 DIAGNOSIS — M25642 Stiffness of left hand, not elsewhere classified: Secondary | ICD-10-CM

## 2024-04-20 DIAGNOSIS — M542 Cervicalgia: Secondary | ICD-10-CM

## 2024-04-20 DIAGNOSIS — M79642 Pain in left hand: Secondary | ICD-10-CM

## 2024-04-20 MED ORDER — SPIRONOLACTONE 25 MG PO TABS: 12.5000 mg | ORAL_TABLET | Freq: Every day | ORAL | 1 refills | Status: DC

## 2024-04-20 NOTE — Therapy (Signed)
 OUTPATIENT PHYSICAL THERAPY CERVICAL TREATMENT   Patient Name: Alicia Booth MRN: [id number] DOB:December 25, 1970, 53 y.o., female Today's Date: 04/20/2024  END OF SESSION:  PT End of Session - 04/20/24 0852     Visit Number 8    Number of Visits 12    Date for PT Re-Evaluation 05/06/24    Authorization Type aetna no copay    Authorization Time Period year    Authorization - Visit Number 8    Authorization - Number of Visits 60    PT Start Time [phone]    PT Stop Time 0930    PT Time Calculation (min) 38 min    Activity Tolerance Patient tolerated treatment well    Behavior During Therapy WFL for tasks assessed/performed            Past Medical History:  Diagnosis Date   Diabetes (HCC)    High cholesterol    Hypertension    Stress-induced cardiomyopathy    Past Surgical History:  Procedure Laterality Date   BARIATRIC SURGERY  2014   BREAST BIOPSY Left    lymph node bx benign   HEEL SPUR EXCISION     LEFT HEART CATH AND CORONARY ANGIOGRAPHY N/A 04/11/2022   Procedure: LEFT HEART CATH AND CORONARY ANGIOGRAPHY;  Surgeon: Lucendia Rusk, MD;  Location: [facility];  Service: Cardiovascular;  Laterality: N/A;   MENISCUS REPAIR     tummy tuck     Patient Active Problem List   Diagnosis Date Noted   Other fatigue 07/25/2023   Seborrheic dermatitis of scalp 05/22/2023   Flexural atopic dermatitis 05/22/2023   Chronic diastolic congestive heart failure (HCC) 01/24/2023   Chronic vaginitis 10/24/2022   Hyperlipidemia associated with type 2 diabetes mellitus (HCC) 10/24/2022   Chronic tension-type headache, not intractable 05/23/2022   Trigger finger, left little finger 05/23/2022   Takotsubo cardiomyopathy 04/24/2022   S/P gastric sleeve procedure 04/24/2022   Adjustment disorder with mixed anxiety and depressed mood 04/24/2022   Perimenopausal symptom 04/24/2022   NSTEMI (non-ST elevated myocardial infarction) (HCC) 04/11/2022   Myopia with astigmatism and  presbyopia, bilateral 03/19/2018   Obstructive sleep apnea (adult) (pediatric) 10/13/2012   Diabetes (HCC) 11/16/2009   Hypertension 11/16/2009    PCP: Nikki Barters, NP REFERRING PROVIDER: Dr Audie Bleacher REFERRING DIAG: Cervicalgia  THERAPY DIAG:  Cervicalgia  Other symptoms and signs involving the musculoskeletal system  Muscle weakness (generalized)  Pain in left hand  Stiffness of left hand, not elsewhere classified  Rationale for Evaluation and Treatment: Rehabilitation  ONSET DATE: 01/10/24  SUBJECTIVE:  SUBJECTIVE STATEMENT: Patient reports she continues to feel sore in upper traps but not pain. Patient states "I wasn't rubbing my side scars as much after the cupping last time".  EVAL: Patient reports history of chronic  R neck and upper trap pain which have been present for the past 5 + years. She had some PT in the past with some improvement but continued to have pain and tightness. Symptoms have increased in the past 3-4 months with no known injury or accident. She describes symptoms as pain and tension in the R trap.   Hand dominance: Right  PERTINENT HISTORY:  MI 2023; valvular disease; takotsubo cardiomyopathy; sleep apnea; AODM; arthritis; anxiety; depression; herniorrhaphy; carpal tunnel surgery L 2024; carpal tunnel R; h/o barriatric surgery   PAIN:  Are you having pain? Yes: NPRS scale: 2/10 today Pain location: R cervical and upper trap area  Pain description: tight; aching; shocking  Aggravating factors: tension; bra strap over shoulder; work Relieving factors: meds; sleep  PRECAUTIONS: None   WEIGHT BEARING RESTRICTIONS: No  FALLS:  Has patient fallen in last 6 months? 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  DIAGNOSTIC FINDINGS:  02/12/24: cervical xray - Normal alignment. Moderate C6-C7 disc space narrowing and anterior spurring, mild C5-C6 anterior spurring and disc space narrowing. No evidence of fracture, focal bone abnormality or bony neural foraminal stenosis. No prevertebral soft tissue thickening.   IMPRESSION: Degenerative disc disease at C5-C6 and C6-C7.  PATIENT SURVEYS:  NDI 18/50; 36%  COGNITION: Overall cognitive status: Within functional limits for tasks assessed  SENSATION: Numbness bilat little fingers at night; sometimes numbness in the LE's   POSTURE: rounded shoulders, forward head, increased thoracic kyphosis, and UE in IR at side; scapulae abducted and rotated along the thoracic spine   PALPATION: Significant muscular tightness in R > L pecs; upper trap; leveator; ant/lat/posterior cervical musculature   CERVICAL ROM:  Active ROM A/PROM (deg) eval  Flexion 50  Extension 50  Right lateral flexion 25  Left lateral flexion 25  Right rotation 62 tight   Left rotation 60 pain R   (Blank rows = not tested)  UPPER EXTREMITY ROM: WFL's some tightness at end range elevation bilat   UPPER EXTREMITY MMT: MMT Right eval Left eval  Shoulder flexion    Shoulder extension    Shoulder abduction    Shoulder adduction    Shoulder extension    Shoulder internal rotation    Shoulder external rotation    Middle trapezius 4+ 5  Lower trapezius 4 5  Elbow flexion    Elbow extension    Wrist flexion    Wrist extension    Wrist ulnar deviation     Wrist radial deviation    Wrist pronation    Wrist supination    Grip strength     (Blank rows = not tested)  CERVICAL SPECIAL TESTS:  Upper limb tension test (ULTT): Positive, Spurling's test: Negative, and Distraction test: Negative    [facility] Adult PT Treatment:  DATE: 04/20/2024 Therapeutic Exercise: Pec doorway stretch 3x30" Wall angels Supine over horiz towel roll below bra line --> shoulder flexion + 1#dowel for thoracic extension Seated side bend stretch + static cups along lateral side Manual Therapy: Cupping for myofascial decompression --> static, dynamic, gliding, shearing --> R upper traps, cervical paraspinals, upper thoracic paraspinals Active UT & LS stretches with static cups Gentle cupping bilateral to lateral incisions     [facility] Adult PT Treatment:                                                DATE: 04/15/2024 Therapeutic Exercise: Side stretch over green bolster + ribcage breathing Manual Therapy: Cupping for myofascial decompression --> static, dynamic, gliding, shearing --> R upper traps, cervical paraspinals, upper thoracic paraspinals Active UT & LS stretches with static cups Gentle cupping along lateral incisions  Self Care: Scar tissue massage   [facility] Adult PT Treatment:                                                DATE: 04/12/24 Therapeutic Exercise: Standing Wall lean with thoracic opening R and L sides Wall scapular retraction/protraction x 10 reps Pec stretch with arms at 70, 90, 120 Manual Therapy: STM bilat upper trap, bilat levator, rhomboids, cervical multifidi Trigger Point Dry Needling Initial Treatment: Pt instructed on Dry Needling rational, procedures, and possible side effects. Pt instructed to expect mild to moderate muscle soreness later in the day and/or into the next day.  Pt instructed in methods to reduce muscle soreness. Pt instructed to continue prescribed HEP. Because Dry Needling  was performed over or adjacent to a lung field, pt was educated on S/S of pneumothorax and to seek immediate medical attention should they occur.  Patient verbalized understanding of these instructions and education.  Patient Verbal Consent Given: Yes Education Handout Provided: Yes Muscles Treated: R upper trapezius, R levator, R C6 cervical multifidi Electrical Stimulation Performed: No Treatment Response/Outcome: palpable lengthening, muscle twitch, *patient notes some discomfort anterior R shoulder after TPDN  Modalities: Moist hot pack x 10 minutes upper trap, R anterior shoulder   PATIENT EDUCATION:  Education details: DN handout Person educated: Patient Education method: Explanation, Demonstration, Tactile cues, Verbal cues, and Handouts Education comprehension: verbalized understanding, returned demonstration, verbal cues required, tactile cues required, and needs further education  HOME EXERCISE PROGRAM: Access Code: [email or web address] URL: [email or web address] Date: 04/05/2024 Prepared by: Celyn Holt  Exercises - Seated Cervical Retraction  - 2 x daily - 7 x weekly - 1-2 sets - 5-10 reps - 10 sec  hold - Supine Cervical Retraction with Towel  - 2 x daily - 7 x weekly - 1 sets - 5-10 reps - 10 sec  hold - Seated Scapular Retraction  - 2 x daily - 7 x weekly - 1-2 sets - 10 reps - 10 sec  hold - Supine Scapular Retraction  - 2 x daily - 7 x weekly - 1 sets - 10 reps - 5-10 sec  hold - Shoulder External Rotation and Scapular Retraction  - 3 x daily - 7 x weekly - 1 sets - 10 reps - 3-5 sec   hold - Shoulder External Rotation in 45 Degrees Abduction  -  2 x daily - 7 x weekly - 1-2 sets - 10 reps - 3 sec  hold - Doorway Pec Stretch at 60 Degrees Abduction  - 3 x daily - 7 x weekly - 1 sets - 3 reps - Doorway Pec Stretch at 90 Degrees Abduction  - 3 x daily - 7 x weekly - 1 sets - 3 reps - 30 seconds  hold - Doorway Pec Stretch at 120 Degrees Abduction  - 3 x daily - 7 x  weekly - 1 sets - 3 reps - 30 second hold  hold - Standing Median Nerve Glide  - 1-2 x daily - 7 x weekly - 1 sets - 10 reps - Standing Ulnar Nerve Glide  - 1-2 x daily - 7 x weekly - 1 sets - 10 reps - Upper Trapezius Stretch  - 3 x daily - 7 x weekly - 1 sets - 3-5 reps - 30 sec hold - Shoulder External Rotation and Scapular Retraction with Resistance  - 2 x daily - 7 x weekly - 1 sets - 10 reps - 3-5 sec  hold - Shoulder W - External Rotation with Resistance  - 2 x daily - 7 x weekly - 1-2 sets - 10 reps - 3 sec  hold - Seated Thoracic Lumbar Extension with Pectoralis Stretch  - 2 x daily - 7 x weekly - 1 sets - 5-8 reps - 10 sec  hold - Seated Cervical Sidebending Stretch  - 2 x daily - 7 x weekly - 1 sets - 3-5 reps - 5-10 sec  hold  Patient Education - Office Posture  ASSESSMENT:  CLINICAL IMPRESSION:  Myofascial decompression continued along upper trap, latissimus dorsi, cervicothoracic paraspinals, as well as scar tissue massage along lateral incisions. Thoracic mobility and static/dynamic pec stretches continued with noted increase din ease of movement.    OBJECTIVE IMPAIRMENTS: decreased activity tolerance, decreased ROM, decreased strength, increased fascial restrictions, increased muscle spasms, impaired UE functional use, improper body mechanics, postural dysfunction, and pain.   GOALS: Goals reviewed with patient? Yes  SHORT TERM GOALS: Target date: 04/15/2024  Independent in initial HEP  Baseline:  Goal status: INITIAL  2.  Decrease muscular tightness and pain by 25% allowing patient to sleep 3-4 hours without awakening due to pain  Baseline:  Goal status: INITIAL  LONG TERM GOALS: Target date: 05/06/2024  Decrease R cervical and upper trap tightness and pain by 75-80% allowing patient to improve functional level with work, leisure, sleep Baseline:  Goal status: INITIAL  2.  Increase cervical ROM by 10-12 degrees in lateral flexion and 5-7 degrees in rotation   Baseline:  Goal status: INITIAL  3.  Improve posture and alignment with patient to demonstrate improved tissue extensibility through anterior chest/pecs and increased muscular engagement in posterior shoulder girdle  Baseline:  Goal status: INITIAL  4.  Patient to demonstrate and verbalize proper posture and alignment for sitting; sleeping; work tasks Baseline:  Goal status: INITIAL  5.  Independent in HEP including aquatic program as indicated  Baseline:  Goal status: INITIAL  6.  Improve NDI by 10 points  Baseline: 18/50; 36%  Goal status: INITIAL   PLAN:  PT FREQUENCY: 2x/week  PT DURATION: 6 weeks  PLANNED INTERVENTIONS: 97110-Therapeutic exercises, 97530- Therapeutic activity, 97112- Neuromuscular re-education, 97535- Self Care, 16109- Manual therapy, Patient/Family education, Taping, Dry Needling, and Joint mobilization  PLAN FOR NEXT SESSION: Review and progress exercises; continue with spine care education; postural correction; manual work and modalities  as indicated    Flint Hummer, PTA 04/20/2024, 9:37 AM

## 2024-04-22 ENCOUNTER — Ambulatory Visit: Payer: Self-pay | Admitting: Rehabilitative and Restorative Service Providers"

## 2024-04-26 ENCOUNTER — Encounter: Payer: Self-pay | Admitting: Rehabilitative and Restorative Service Providers"

## 2024-04-26 ENCOUNTER — Ambulatory Visit: Payer: Self-pay | Admitting: Rehabilitative and Restorative Service Providers"

## 2024-04-26 DIAGNOSIS — M542 Cervicalgia: Secondary | ICD-10-CM | POA: Diagnosis not present

## 2024-04-26 DIAGNOSIS — M6281 Muscle weakness (generalized): Secondary | ICD-10-CM

## 2024-04-26 DIAGNOSIS — R29898 Other symptoms and signs involving the musculoskeletal system: Secondary | ICD-10-CM

## 2024-04-26 NOTE — Therapy (Signed)
 OUTPATIENT PHYSICAL THERAPY CERVICAL TREATMENT   Patient Name: Alicia Booth MRN: [id number] DOB:03-27-1971, 53 y.o., female Today's Date: 04/26/2024  END OF SESSION:  PT End of Session - 04/26/24 1316     Visit Number 9    Number of Visits 12    Date for PT Re-Evaluation 05/06/24    Authorization Type aetna no copay    Authorization Time Period year    Authorization - Number of Visits 60    PT Start Time 1317    PT Stop Time 1400    PT Time Calculation (min) 43 min    Activity Tolerance Patient tolerated treatment well    Behavior During Therapy WFL for tasks assessed/performed             Past Medical History:  Diagnosis Date   Diabetes (HCC)    High cholesterol    Hypertension    Stress-induced cardiomyopathy    Past Surgical History:  Procedure Laterality Date   BARIATRIC SURGERY  2014   BREAST BIOPSY Left    lymph node bx benign   HEEL SPUR EXCISION     LEFT HEART CATH AND CORONARY ANGIOGRAPHY N/A 04/11/2022   Procedure: LEFT HEART CATH AND CORONARY ANGIOGRAPHY;  Surgeon: Lucendia Rusk, MD;  Location: [facility];  Service: Cardiovascular;  Laterality: N/A;   MENISCUS REPAIR     tummy tuck     Patient Active Problem List   Diagnosis Date Noted   Other fatigue 07/25/2023   Seborrheic dermatitis of scalp 05/22/2023   Flexural atopic dermatitis 05/22/2023   Chronic diastolic congestive heart failure (HCC) 01/24/2023   Chronic vaginitis 10/24/2022   Hyperlipidemia associated with type 2 diabetes mellitus (HCC) 10/24/2022   Chronic tension-type headache, not intractable 05/23/2022   Trigger finger, left little finger 05/23/2022   Takotsubo cardiomyopathy 04/24/2022   S/P gastric sleeve procedure 04/24/2022   Adjustment disorder with mixed anxiety and depressed mood 04/24/2022   Perimenopausal symptom 04/24/2022   NSTEMI (non-ST elevated myocardial infarction) (HCC) 04/11/2022   Myopia with astigmatism and presbyopia, bilateral 03/19/2018    Obstructive sleep apnea (adult) (pediatric) 10/13/2012   Diabetes (HCC) 11/16/2009   Hypertension 11/16/2009    PCP: Nikki Barters, NP REFERRING PROVIDER: Dr Audie Bleacher REFERRING DIAG: Cervicalgia  THERAPY DIAG:  Cervicalgia  Other symptoms and signs involving the musculoskeletal system  Muscle weakness (generalized)  Rationale for Evaluation and Treatment: Rehabilitation  ONSET DATE: 01/10/24  SUBJECTIVE:  SUBJECTIVE STATEMENT: The patient reports that neck is tight. She feels like the needling, the cupping, and exercise help. She cooks on Sunday and makes big family meals, so she is tight today. She does exercise, but can't tell that it's going to hurt.   EVAL: Patient reports history of chronic  R neck and upper trap pain which have been present for the past 5 + years. She had some PT in the past with some improvement but continued to have pain and tightness. Symptoms have increased in the past 3-4 months with no known injury or accident. She describes symptoms as pain and tension in the R trap.   Hand dominance: Right  PERTINENT HISTORY:  MI 2023; valvular disease; takotsubo cardiomyopathy; sleep apnea; AODM; arthritis; anxiety; depression; herniorrhaphy; carpal tunnel surgery L 2024; carpal tunnel R; h/o barriatric surgery  PAIN:  Are you having pain? Yes: NPRS scale: 2/10 today Pain location: R cervical and upper trap area  Pain description: tight; aching; shocking  Aggravating factors: tension; bra strap over shoulder; work Relieving factors: meds; sleep  PRECAUTIONS: None  WEIGHT BEARING RESTRICTIONS: No  FALLS:  Has patient fallen in last 6 months? 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  DIAGNOSTIC FINDINGS:  02/12/24: cervical xray - Normal alignment. Moderate C6-C7 disc space narrowing and anterior spurring, mild C5-C6 anterior spurring and disc space narrowing. No evidence of fracture, focal bone abnormality or bony neural foraminal stenosis. No prevertebral soft tissue thickening.   IMPRESSION: Degenerative disc disease at C5-C6 and C6-C7.  PATIENT SURVEYS:  NDI 18/50; 36%  COGNITION: Overall cognitive status: Within functional limits for tasks assessed  SENSATION: Numbness bilat little fingers at night; sometimes numbness in the LE's   POSTURE: rounded shoulders, forward head, increased thoracic kyphosis, and UE in IR at side; scapulae abducted and rotated along the thoracic spine   PALPATION: Significant muscular tightness in R > L pecs; upper trap; leveator; ant/lat/posterior cervical musculature   CERVICAL ROM:  Active ROM A/PROM (deg) eval  Flexion 50  Extension 50  Right lateral flexion 25  Left lateral flexion 25  Right rotation 62 tight   Left rotation 60 pain R   (Blank rows = not tested)  UPPER EXTREMITY ROM: WFL's some tightness at end range elevation bilat   UPPER EXTREMITY MMT: MMT Right eval Left eval  Shoulder flexion    Shoulder extension    Shoulder abduction    Shoulder adduction    Shoulder extension    Shoulder internal rotation    Shoulder external rotation    Middle trapezius 4+ 5  Lower trapezius 4 5  Elbow flexion    Elbow extension    Wrist flexion    Wrist extension    Wrist ulnar deviation    Wrist radial deviation    Wrist pronation     Wrist supination    Grip strength     (Blank rows = not tested)  CERVICAL SPECIAL TESTS:  Upper limb tension test (ULTT): Positive, Spurling's test: Negative, and Distraction test: Negative  [facility] Adult PT Treatment:                                                DATE: 04/26/24 Therapeutic Exercise: Standing L with theraband (red) x 10 reps W with theraand x 10 reps Wall leans with thoracic opening R and L Manual Therapy: STM cervical multifidi, upper trap bilaterally, and R infraspinatus Joint mobilization with PA mobs upper thoracic spine grade II Trigger Point Dry Needling Subsequent Treatment: Instructions reviewed, if requested by the patient, prior to subsequent dry needling treatment.  Patient Verbal Consent Given: Yes Education Handout Provided: Previously Provided Muscles Treated: upper trap R and L, R cervical multifidi, R infraspinatus Electrical Stimulation Performed: No Treatment Response/Outcome: palpable lengthening, twitch response    [facility] Adult PT Treatment:                                                DATE: 04/20/2024 Therapeutic Exercise: Pec doorway stretch 3x30" Wall angels Supine over horiz towel roll below bra line --> shoulder flexion + 1#dowel for thoracic extension Seated side bend stretch + static cups along lateral side Manual Therapy: Cupping for myofascial decompression --> static, dynamic, gliding, shearing --> R upper traps, cervical paraspinals, upper thoracic paraspinals Active UT & LS stretches with static cups Gentle cupping bilateral to lateral incisions     [facility] Adult PT Treatment:                                                DATE: 04/15/2024 Therapeutic Exercise: Side stretch over green bolster + ribcage breathing Manual Therapy: Cupping for myofascial decompression --> static, dynamic, gliding, shearing --> R upper traps, cervical paraspinals, upper thoracic paraspinals Active UT & LS stretches with static cups Gentle cupping  along lateral incisions  Self Care: Scar tissue massage   PATIENT EDUCATION:  Education details: DN handout Person educated: Patient Education method: Explanation, Demonstration, Tactile cues, Verbal cues, and Handouts Education comprehension: verbalized understanding, returned demonstration, verbal cues required, tactile cues required, and needs further education  HOME EXERCISE PROGRAM: UPDATED HEP:    Access Code: [email or web address] URL: [email or web address] Date: 04/26/2024 Prepared by: Trygve Gage  Exercises - Seated Cervical Retraction  - 1 x daily - 5 x weekly - 1-2 sets - 5-10 reps - 10 sec  hold - Seated Cervical Sidebending Stretch  - 1 x daily - 5 x weekly - 1 sets - 3 reps - 20 seconds hold - Seated Thoracic Lumbar Extension with Pectoralis Stretch  - 1 x daily - 5 x weekly - 1 sets - 3 reps - 20 seconds hold - Doorway Pec Stretch at 120 Degrees Abduction  - 1 x daily - 5 x weekly - 1 sets - 3 reps - 30 second hold  hold - Shoulder External Rotation and Scapular Retraction with Resistance  - 1 x daily - 5 x weekly - 1 sets - 10 reps - 3-5 sec  hold - Standing with Forearms Thoracic Rotation  - 1 x daily - 5 x weekly - 1 sets - 10 reps  ASSESSMENT:  CLINICAL IMPRESSION:  The patient noted only doing door frame stretch and upper trap stretch from HEP due to # of exercises. We discussed the need for loading the tissue so she will be able to withstand daily activities (like cooking big meals, being at her work station, Catering manager). PT modified HEP today (chose based on what patient is doing-- will need more strengthening), and also worked on soft tissue release with DN and STM to reduce tightness.    OBJECTIVE IMPAIRMENTS: decreased activity tolerance, decreased ROM, decreased strength, increased fascial restrictions, increased muscle spasms, impaired UE functional use, improper body mechanics, postural dysfunction, and pain.   GOALS: Goals reviewed with patient?  Yes  SHORT TERM GOALS: Target date: 04/15/2024  Independent in initial HEP  Baseline:  Goal status: MET  2.  Decrease muscular tightness and pain by 25% allowing patient to sleep 3-4 hours without awakening due to pain  Baseline:  Goal status: MET-- she reports significant improvement since beginning PT.   LONG TERM GOALS: Target date: 05/06/2024  Decrease R cervical and upper trap tightness and pain by 75-80% allowing patient to improve functional level with work, leisure, sleep Baseline:  Goal status: INITIAL  2.  Increase cervical ROM by 10-12 degrees in lateral flexion and 5-7 degrees in rotation  Baseline:  Goal status: INITIAL  3.  Improve posture and alignment with patient to demonstrate improved tissue extensibility through anterior chest/pecs and increased muscular engagement in posterior shoulder girdle  Baseline:  Goal status: INITIAL  4.  Patient to demonstrate and verbalize proper posture and alignment for sitting; sleeping; work tasks Baseline:  Goal status: INITIAL  5.  Independent in HEP including aquatic program as indicated  Baseline:  Goal status: INITIAL  6.  Improve NDI by 10 points  Baseline: 18/50; 36%  Goal status: INITIAL  PLAN:  PT FREQUENCY: 2x/week  PT DURATION: 6 weeks  PLANNED INTERVENTIONS: 97110-Therapeutic exercises, 97530- Therapeutic activity, 97112- Neuromuscular re-education, 97535- Self Care, [id number]- Manual therapy, Patient/Family education, Taping, Dry Needling, and Joint mobilization  PLAN FOR NEXT SESSION: Review and progress exercises; discuss HEP-- can we add 2 more scapular strengthening activities (if she will do)? Postural strengthening, STM as indicated. Begin checking LTGs.    Danaye Sobh, PT 04/26/2024, 3:52 PM

## 2024-04-29 ENCOUNTER — Ambulatory Visit: Payer: Self-pay | Admitting: Rehabilitative and Restorative Service Providers"

## 2024-04-29 ENCOUNTER — Encounter: Payer: Self-pay | Admitting: Rehabilitative and Restorative Service Providers"

## 2024-04-29 DIAGNOSIS — R29898 Other symptoms and signs involving the musculoskeletal system: Secondary | ICD-10-CM

## 2024-04-29 DIAGNOSIS — M542 Cervicalgia: Secondary | ICD-10-CM

## 2024-04-29 DIAGNOSIS — M79642 Pain in left hand: Secondary | ICD-10-CM

## 2024-04-29 DIAGNOSIS — M6281 Muscle weakness (generalized): Secondary | ICD-10-CM

## 2024-04-29 DIAGNOSIS — M25642 Stiffness of left hand, not elsewhere classified: Secondary | ICD-10-CM

## 2024-04-29 NOTE — Therapy (Signed)
 OUTPATIENT PHYSICAL THERAPY CERVICAL TREATMENT   Patient Name: Alicia Booth MRN: [id number] DOB:[birth year], 53 y.o., female Today's Date: 04/29/2024  END OF SESSION:  PT End of Session - 04/29/24 1616     Visit Number 10    Number of Visits 12    Date for PT Re-Evaluation 05/06/24    Authorization Type aetna no copay    Authorization Time Period year    Authorization - Visit Number 10    Authorization - Number of Visits 60    PT Start Time 1615    PT Stop Time 1700    PT Time Calculation (min) 45 min    Activity Tolerance Patient tolerated treatment well             Past Medical History:  Diagnosis Date   Diabetes (HCC)    High cholesterol    Hypertension    Stress-induced cardiomyopathy    Past Surgical History:  Procedure Laterality Date   BARIATRIC SURGERY  2014   BREAST BIOPSY Left    lymph node bx benign   HEEL SPUR EXCISION     LEFT HEART CATH AND CORONARY ANGIOGRAPHY N/A 04/11/2022   Procedure: LEFT HEART CATH AND CORONARY ANGIOGRAPHY;  Surgeon: Lucendia Rusk, MD;  Location: [facility];  Service: Cardiovascular;  Laterality: N/A;   MENISCUS REPAIR     tummy tuck     Patient Active Problem List   Diagnosis Date Noted   Other fatigue 07/25/2023   Seborrheic dermatitis of scalp 05/22/2023   Flexural atopic dermatitis 05/22/2023   Chronic diastolic congestive heart failure (HCC) 01/24/2023   Chronic vaginitis 10/24/2022   Hyperlipidemia associated with type 2 diabetes mellitus (HCC) 10/24/2022   Chronic tension-type headache, not intractable 05/23/2022   Trigger finger, left little finger 05/23/2022   Takotsubo cardiomyopathy 04/24/2022   S/P gastric sleeve procedure 04/24/2022   Adjustment disorder with mixed anxiety and depressed mood 04/24/2022   Perimenopausal symptom 04/24/2022   NSTEMI (non-ST elevated myocardial infarction) (HCC) 04/11/2022   Myopia with astigmatism and presbyopia, bilateral 03/19/2018   Obstructive sleep apnea  (adult) (pediatric) 10/13/2012   Diabetes (HCC) 11/16/2009   Hypertension 11/16/2009    PCP: Nikki Barters, NP REFERRING PROVIDER: Dr Audie Bleacher REFERRING DIAG: Cervicalgia  THERAPY DIAG:  Cervicalgia  Other symptoms and signs involving the musculoskeletal system  Muscle weakness (generalized)  Pain in left hand  Stiffness of left hand, not elsewhere classified  Rationale for Evaluation and Treatment: Rehabilitation  ONSET DATE: 01/10/24  SUBJECTIVE:  SUBJECTIVE STATEMENT: The patient reports that her neck and shoulders continue to feel tight. The needling, cupping, and exercise all help. She is trying to decide is she is going to pursue breast reduction. She continues to work on her exercises at home.    EVAL: Patient reports history of chronic  R neck and upper trap pain which have been present for the past 5 + years. She had some PT in the past with some improvement but continued to have pain and tightness. Symptoms have increased in the past 3-4 months with no known injury or accident. She describes symptoms as pain and tension in the R trap.   Hand dominance: Right  PERTINENT HISTORY:  MI 2023; valvular disease; takotsubo cardiomyopathy; sleep apnea; AODM; arthritis; anxiety; depression; herniorrhaphy; carpal tunnel surgery L 2024; carpal tunnel R; h/o barriatric surgery  PAIN:  Are you having pain? Yes: NPRS scale: 2/10 today Pain location: R cervical and upper trap area  Pain description: tight; aching; shocking  Aggravating factors: tension; bra strap over shoulder; work Relieving factors: meds; sleep  PRECAUTIONS: None  WEIGHT BEARING RESTRICTIONS: No  FALLS:  Has patient fallen in last 6 months? 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  DIAGNOSTIC FINDINGS:  02/12/24: cervical xray - Normal alignment. Moderate C6-C7 disc space narrowing and anterior spurring, mild C5-C6 anterior spurring and disc space narrowing. No evidence of fracture, focal bone abnormality or bony neural foraminal stenosis. No prevertebral soft tissue thickening.   IMPRESSION: Degenerative disc disease at C5-C6 and C6-C7.  PATIENT SURVEYS:  NDI 18/50; 36%  COGNITION: Overall cognitive status: Within functional limits for tasks assessed  SENSATION: Numbness bilat little fingers at night; sometimes numbness in the LE's   POSTURE: rounded shoulders, forward head, increased thoracic kyphosis, and UE in IR at side; scapulae abducted and rotated along the thoracic spine   PALPATION: Significant muscular tightness in R > L pecs; upper trap; leveator; ant/lat/posterior cervical musculature   CERVICAL ROM:  Active ROM A/PROM (deg) eval  Flexion 50  Extension 50  Right lateral flexion 25  Left lateral flexion 25  Right rotation 62 tight   Left rotation 60 pain R   (Blank rows = not tested)  UPPER EXTREMITY ROM: WFL's some tightness at end range elevation bilat   UPPER EXTREMITY MMT: MMT Right eval Left eval  Shoulder flexion    Shoulder extension    Shoulder abduction    Shoulder adduction    Shoulder extension    Shoulder internal rotation    Shoulder external rotation    Middle trapezius 4+ 5  Lower trapezius 4 5  Elbow flexion    Elbow extension    Wrist flexion    Wrist extension    Wrist  ulnar deviation    Wrist radial deviation    Wrist pronation    Wrist supination    Grip strength     (Blank rows = not tested)  CERVICAL SPECIAL TESTS:  Upper limb tension test (ULTT): Positive, Spurling's test: Negative, and Distraction test: Negative  [facility] Adult PT Treatment:                                                DATE: 04/29/24 Therapeutic Exercise: Standing L with theraband (red) x 20 reps W with red theraband x 20 reps Wall leans with thoracic opening R and L Therapeutic Activity:  Row blue TB 3 sec x 20  Shoulder extension blue TB 3 sec x 20 Bow and arrow blue TB 3 sec x 10 R/L   Manual Therapy: STM cervical multifidi, upper trap bilaterally, and R infraspinatus PROM/stretching cervical spine    [facility] Adult PT Treatment:                                                DATE: 04/26/24 Therapeutic Exercise: Standing L with theraband (red) x 10 reps W with theraand x 10 reps Wall leans with thoracic opening R and L Manual Therapy: STM cervical multifidi, upper trap bilaterally, and R infraspinatus Joint mobilization with PA mobs upper thoracic spine grade II Trigger Point Dry Needling Subsequent Treatment: Instructions reviewed, if requested by the patient, prior to subsequent dry needling treatment.  Patient Verbal Consent Given: Yes Education Handout Provided: Previously Provided Muscles Treated: upper trap R and L, R cervical multifidi, R infraspinatus Electrical Stimulation Performed: No Treatment Response/Outcome: palpable lengthening, twitch response    [facility] Adult PT Treatment:                                                DATE: 04/20/2024 Therapeutic Exercise: Pec doorway stretch 3x30" Wall angels Supine over horiz towel roll below bra line --> shoulder flexion + 1#dowel for thoracic extension Seated side bend stretch + static cups along lateral side Manual Therapy: Cupping for myofascial decompression --> static, dynamic, gliding, shearing --> R  upper traps, cervical paraspinals, upper thoracic paraspinals Active UT & LS stretches with static cups Gentle cupping bilateral to lateral incisions     [facility] Adult PT Treatment:                                                DATE: 04/15/2024 Therapeutic Exercise: Side stretch over green bolster + ribcage breathing Manual Therapy: Cupping for myofascial decompression --> static, dynamic, gliding, shearing --> R upper traps, cervical paraspinals, upper thoracic paraspinals Active UT & LS stretches with static cups Gentle cupping along lateral incisions  Self Care: Scar tissue massage   PATIENT EDUCATION:  Education details: DN handout Person educated: Patient Education method: Explanation, Demonstration, Tactile cues, Verbal cues, and Handouts Education comprehension: verbalized understanding, returned demonstration, verbal cues required, tactile cues required, and needs further education  HOME EXERCISE PROGRAM: Access Code: [email or web address] URL: [email or web address] Date: 04/29/2024 Prepared by: Tatiyana Foucher  Exercises - Seated Cervical Retraction  - 1 x daily - 5 x weekly - 1-2 sets - 5-10 reps - 10 sec  hold - Seated Cervical Sidebending Stretch  - 1 x daily - 5 x weekly -  1 sets - 3 reps - 20 seconds hold - Seated Thoracic Lumbar Extension with Pectoralis Stretch  - 1 x daily - 5 x weekly - 1 sets - 3 reps - 20 seconds hold - Doorway Pec Stretch at 120 Degrees Abduction  - 1 x daily - 5 x weekly - 1 sets - 3 reps - 30 second hold  hold - Shoulder External Rotation and Scapular Retraction with Resistance  - 1 x daily - 5 x weekly - 1 sets - 10 reps - 3-5 sec  hold - Standing with Forearms Thoracic Rotation  - 1 x daily - 5 x weekly - 1 sets - 10 reps - Standing Bilateral Low Shoulder Row with Anchored Resistance  - 2 x daily - 7 x weekly - 1-3 sets - 10 reps - 2-3 sec  hold - Drawing Bow  - 1 x daily - 7 x weekly - 1 sets - 10 reps - 3 sec  hold - Shoulder extension with  resistance - Neutral  - 1 x daily - 7 x weekly - 1-2 sets - 10 reps - 3-5 sec  hold  ASSESSMENT:  CLINICAL IMPRESSION: Jhayla reports some improvement during the course of treatment. She has less pain but continues to have tightness in the neck and shoulder areas. Continued with stretching and strengthening. Added posterior shoulder girdle/postural strengthening with TB for home. Will continue with soft tissue mobilization; myofacial release; cupping; DN to decrease tightness and improve tissue extensibility.    OBJECTIVE IMPAIRMENTS: decreased activity tolerance, decreased ROM, decreased strength, increased fascial restrictions, increased muscle spasms, impaired UE functional use, improper body mechanics, postural dysfunction, and pain.   GOALS: Goals reviewed with patient? Yes  SHORT TERM GOALS: Target date: 04/15/2024  Independent in initial HEP  Baseline:  Goal status: MET  2.  Decrease muscular tightness and pain by 25% allowing patient to sleep 3-4 hours without awakening due to pain  Baseline:  Goal status: MET-- she reports significant improvement since beginning PT.   LONG TERM GOALS: Target date: 05/06/2024  Decrease R cervical and upper trap tightness and pain by 75-80% allowing patient to improve functional level with work, leisure, sleep Baseline:  Goal status: INITIAL  2.  Increase cervical ROM by 10-12 degrees in lateral flexion and 5-7 degrees in rotation  Baseline:  Goal status: INITIAL  3.  Improve posture and alignment with patient to demonstrate improved tissue extensibility through anterior chest/pecs and increased muscular engagement in posterior shoulder girdle  Baseline:  Goal status: INITIAL  4.  Patient to demonstrate and verbalize proper posture and alignment for sitting; sleeping; work tasks Baseline:  Goal status: INITIAL  5.  Independent in HEP including aquatic program as indicated  Baseline:  Goal status: INITIAL  6.  Improve NDI by 10 points   Baseline: 18/50; 36%  Goal status: INITIAL  PLAN:  PT FREQUENCY: 2x/week  PT DURATION: 6 weeks  PLANNED INTERVENTIONS: 97110-Therapeutic exercises, 97530- Therapeutic activity, 97112- Neuromuscular re-education, 97535- Self Care, 28413- Manual therapy, Patient/Family education, Taping, Dry Needling, and Joint mobilization  PLAN FOR NEXT SESSION: Review and progress exercises; discuss HEP-- can we add 2 more scapular strengthening activities (if she will do)? Postural strengthening, STM as indicated. Begin checking LTGs.    Nicoli Nardozzi P Rino Hosea, PT 04/29/2024, 4:17 PM

## 2024-05-10 ENCOUNTER — Ambulatory Visit: Attending: Neurosurgery

## 2024-05-10 DIAGNOSIS — M542 Cervicalgia: Secondary | ICD-10-CM | POA: Insufficient documentation

## 2024-05-10 DIAGNOSIS — R29898 Other symptoms and signs involving the musculoskeletal system: Secondary | ICD-10-CM | POA: Insufficient documentation

## 2024-05-10 DIAGNOSIS — M6281 Muscle weakness (generalized): Secondary | ICD-10-CM | POA: Diagnosis present

## 2024-05-10 DIAGNOSIS — M79642 Pain in left hand: Secondary | ICD-10-CM | POA: Diagnosis present

## 2024-05-10 NOTE — Therapy (Signed)
 OUTPATIENT PHYSICAL THERAPY CERVICAL TREATMENT   Patient Name: Alicia Booth MRN: [id number] DOB:Jul 23, 1971, 53 y.o., female Today's Date: 05/10/2024  END OF SESSION:  PT End of Session - 05/10/24 1531     Visit Number 11    Number of Visits 12    Date for PT Re-Evaluation 05/06/24    Authorization Type aetna no copay    Authorization Time Period 60 VISITS PER YEAR    Authorization - Visit Number 11    Authorization - Number of Visits 60    PT Start Time 1535    PT Stop Time 1630    PT Time Calculation (min) 55 min    Activity Tolerance Patient tolerated treatment well    Behavior During Therapy WFL for tasks assessed/performed            Past Medical History:  Diagnosis Date   Diabetes (HCC)    High cholesterol    Hypertension    Stress-induced cardiomyopathy    Past Surgical History:  Procedure Laterality Date   BARIATRIC SURGERY  2014   BREAST BIOPSY Left    lymph node bx benign   HEEL SPUR EXCISION     LEFT HEART CATH AND CORONARY ANGIOGRAPHY N/A 04/11/2022   Procedure: LEFT HEART CATH AND CORONARY ANGIOGRAPHY;  Surgeon: Lucendia Rusk, MD;  Location: [facility];  Service: Cardiovascular;  Laterality: N/A;   MENISCUS REPAIR     tummy tuck     Patient Active Problem List   Diagnosis Date Noted   Other fatigue 07/25/2023   Seborrheic dermatitis of scalp 05/22/2023   Flexural atopic dermatitis 05/22/2023   Chronic diastolic congestive heart failure (HCC) 01/24/2023   Chronic vaginitis 10/24/2022   Hyperlipidemia associated with type 2 diabetes mellitus (HCC) 10/24/2022   Chronic tension-type headache, not intractable 05/23/2022   Trigger finger, left little finger 05/23/2022   Takotsubo cardiomyopathy 04/24/2022   S/P gastric sleeve procedure 04/24/2022   Adjustment disorder with mixed anxiety and depressed mood 04/24/2022   Perimenopausal symptom 04/24/2022   NSTEMI (non-ST elevated myocardial infarction) (HCC) 04/11/2022   Myopia with  astigmatism and presbyopia, bilateral 03/19/2018   Obstructive sleep apnea (adult) (pediatric) 10/13/2012   Diabetes (HCC) 11/16/2009   Hypertension 11/16/2009    PCP: Nikki Barters, NP REFERRING PROVIDER: Dr Audie Bleacher REFERRING DIAG: Cervicalgia  THERAPY DIAG:  Cervicalgia  Other symptoms and signs involving the musculoskeletal system  Muscle weakness (generalized)  Pain in left hand  Rationale for Evaluation and Treatment: Rehabilitation  ONSET DATE: 01/10/24  SUBJECTIVE:  SUBJECTIVE STATEMENT: Patient reports she was at a conference in [place]  and her neck got really tight and was not sleeping well. Patient states overall she is feeling better.   EVAL: Patient reports history of chronic  R neck and upper trap pain which have been present for the past 5 + years. She had some PT in the past with some improvement but continued to have pain and tightness. Symptoms have increased in the past 3-4 months with no known injury or accident. She describes symptoms as pain and tension in the R trap.   Hand dominance: Right  PERTINENT HISTORY:  MI 2023; valvular disease; takotsubo cardiomyopathy; sleep apnea; AODM; arthritis; anxiety; depression; herniorrhaphy; carpal tunnel surgery L 2024; carpal tunnel R; h/o barriatric surgery  PAIN:  Are you having pain? Yes: NPRS scale: 2/10 today Pain location: R cervical and upper trap area  Pain description: tight; aching; shocking  Aggravating factors: tension; bra strap over shoulder; work Relieving factors: meds; sleep  PRECAUTIONS: None  WEIGHT BEARING RESTRICTIONS: No  FALLS:  Has patient fallen in last 6 months? 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  DIAGNOSTIC FINDINGS:  02/12/24: cervical xray - Normal alignment. Moderate C6-C7 disc space narrowing and anterior spurring, mild C5-C6 anterior spurring and disc space narrowing. No evidence of fracture, focal bone abnormality or bony neural foraminal stenosis. No prevertebral soft tissue thickening.   IMPRESSION: Degenerative disc disease at C5-C6 and C6-C7.  PATIENT SURVEYS:  NDI 18/50; 36%  COGNITION: Overall cognitive status: Within functional limits for tasks assessed  SENSATION: Numbness bilat little fingers at night; sometimes numbness in the LE's   POSTURE: rounded shoulders, forward head, increased thoracic kyphosis, and UE in IR at side; scapulae abducted and rotated along the thoracic spine   PALPATION: Significant muscular tightness in R > L pecs; upper trap; leveator; ant/lat/posterior cervical musculature   CERVICAL ROM:  Active ROM A/PROM (deg) eval  Flexion 50  Extension 50  Right lateral flexion 25  Left lateral flexion 25  Right rotation 62 tight   Left rotation 60 pain R   (Blank rows = not tested)  UPPER EXTREMITY ROM: WFL's some tightness at end range elevation bilat   UPPER EXTREMITY MMT: MMT Right eval Left eval  Shoulder flexion    Shoulder extension    Shoulder abduction    Shoulder adduction    Shoulder extension    Shoulder internal rotation    Shoulder external rotation    Middle trapezius 4+ 5  Lower trapezius 4 5  Elbow flexion    Elbow extension    Wrist flexion    Wrist extension    Wrist ulnar deviation    Wrist radial deviation    Wrist pronation     Wrist supination    Grip strength     (Blank rows = not tested)  CERVICAL SPECIAL TESTS:  Upper limb tension test (ULTT): Positive, Spurling's test: Negative, and Distraction test: Negative   [facility] Adult PT Treatment:  DATE: 05/10/2024 Therapeutic Exercise: Seated UT (Rt) Thoracic rotation with forearms on wall UT & LS stretches with static cups (RT) Manual Therapy: Myofascial decompression with static and dynamic cupping Neuromuscular re-ed: T arms with back against wall --> ribcage stabilization + palm flip up/down Serratus wall slides with forearms on foam roller V arm lift off from wall Supine on foam roller" Angel arms Scissor arms + 1#DB Arm circles + 1#DB Hug <--> T arms + 1#DB Thoracic extension over black foam roller   [facility] Adult PT Treatment:                                                DATE: 04/29/24 Therapeutic Exercise: Standing L with theraband (red) x 20 reps W with red theraband x 20 reps Wall leans with thoracic opening R and L Therapeutic Activity:  Row blue TB 3 sec x 20  Shoulder extension blue TB 3 sec x 20 Bow and arrow blue TB 3 sec x 10 R/L   Manual Therapy: STM cervical multifidi, upper trap bilaterally, and R infraspinatus PROM/stretching cervical spine    [facility] Adult PT Treatment:                                                DATE: 04/26/24 Therapeutic Exercise: Standing L with theraband (red) x 10 reps W with theraand x 10 reps Wall leans with thoracic opening R and L Manual Therapy: STM cervical multifidi, upper trap bilaterally, and R infraspinatus Joint mobilization with PA mobs upper thoracic spine grade II Trigger Point Dry Needling Subsequent Treatment: Instructions reviewed, if requested by the patient, prior to subsequent dry needling treatment.  Patient Verbal Consent Given: Yes Education Handout Provided: Previously Provided Muscles Treated: upper trap R and L, R cervical  multifidi, R infraspinatus Electrical Stimulation Performed: No Treatment Response/Outcome: palpable lengthening, twitch response    [facility] Adult PT Treatment:                                                DATE: 04/20/2024 Therapeutic Exercise: Pec doorway stretch 3x30" Wall angels Supine over horiz towel roll below bra line --> shoulder flexion + 1#dowel for thoracic extension Seated side bend stretch + static cups along lateral side Manual Therapy: Cupping for myofascial decompression --> static, dynamic, gliding, shearing --> R upper traps, cervical paraspinals, upper thoracic paraspinals Active UT & LS stretches with static cups Gentle cupping bilateral to lateral incisions     [facility] Adult PT Treatment:                                                DATE: 04/15/2024 Therapeutic Exercise: Side stretch over green bolster + ribcage breathing Manual Therapy: Cupping for myofascial decompression --> static, dynamic, gliding, shearing --> R upper traps, cervical paraspinals, upper thoracic paraspinals Active UT & LS stretches with static cups Gentle cupping along lateral incisions  Self Care: Scar tissue massage   PATIENT EDUCATION:  Education  details: DN handout Person educated: Patient Education method: Explanation, Demonstration, Tactile cues, Verbal cues, and Handouts Education comprehension: verbalized understanding, returned demonstration, verbal cues required, tactile cues required, and needs further education  HOME EXERCISE PROGRAM: Access Code: [email or web address] URL: [email or web address] Date: 05/10/2024 Prepared by: Sims Duck  Exercises - Seated Cervical Sidebending Stretch  - 1 x daily - 5 x weekly - 1 sets - 3 reps - 20 seconds hold - Doorway Pec Stretch at 120 Degrees Abduction  - 1 x daily - 5 x weekly - 1 sets - 3 reps - 30 second hold  hold - Shoulder External Rotation and Scapular Retraction with Resistance  - 1 x daily - 5 x weekly - 1 sets - 10 reps -  3-5 sec  hold - Standing with Forearms Thoracic Rotation  - 1 x daily - 5 x weekly - 1 sets - 10 reps - Standing Bilateral Low Shoulder Row with Anchored Resistance  - 2 x daily - 7 x weekly - 1-3 sets - 10 reps - 2-3 sec  hold - Drawing Bow  - 1 x daily - 7 x weekly - 1 sets - 10 reps - 3 sec  hold - Shoulder extension with resistance - Neutral  - 1 x daily - 7 x weekly - 1-2 sets - 10 reps - 3-5 sec  hold - Thoracic Extension Mobilization on Foam Roll  - 1 x daily - 7 x weekly - 3 sets - 10 reps  ASSESSMENT:  CLINICAL IMPRESSION:  Postural exercises continued, adding in thoracic extension and rotation mobility. Cupping continued for myofascial decompression with good response from patient; noted greater myofascial tension Rt upper traps as compared to Lt. NDI score improved by over 10 points, indicating improved quality of mobility and subjective pain symptoms. Patient interested in dry needling treatment at next PT visit.   OBJECTIVE IMPAIRMENTS: decreased activity tolerance, decreased ROM, decreased strength, increased fascial restrictions, increased muscle spasms, impaired UE functional use, improper body mechanics, postural dysfunction, and pain.   GOALS: Goals reviewed with patient? Yes  SHORT TERM GOALS: Target date: 04/15/2024  Independent in initial HEP  Baseline:  Goal status: MET  2.  Decrease muscular tightness and pain by 25% allowing patient to sleep 3-4 hours without awakening due to pain  Baseline:  Goal status: MET-- she reports significant improvement since beginning PT.    LONG TERM GOALS: Target date: 05/06/2024  Decrease R cervical and upper trap tightness and pain by 75-80% allowing patient to improve functional level with work, leisure, sleep Baseline:  Goal status: IN PROGRESS  2.  Increase cervical ROM by 10-12 degrees in lateral flexion and 5-7 degrees in rotation  Baseline:  Goal status: INITIAL  3.  Improve posture and alignment with patient to  demonstrate improved tissue extensibility through anterior chest/pecs and increased muscular engagement in posterior shoulder girdle  Baseline:  Goal status: INITIAL  4.  Patient to demonstrate and verbalize proper posture and alignment for sitting; sleeping; work tasks Baseline:  Goal status: INITIAL  5.  Independent in HEP including aquatic program as indicated  Baseline:  Goal status: MET  6.  Improve NDI by 10 points  Baseline: 18/50; 36%  05/10/24: 24% Goal status: MET  PLAN:  PT FREQUENCY: 2x/week  PT DURATION: 6 weeks  PLANNED INTERVENTIONS: 97110-Therapeutic exercises, 97530- Therapeutic activity, 97112- Neuromuscular re-education, 97535- Self Care, [id number]- Manual therapy, Patient/Family education, Taping, Dry Needling, and Joint mobilization  PLAN FOR NEXT SESSION: Patient wants DN next visit -  multiple needles ($45). Re-eval next visit. Edit down HEP. Postural strengthening, STM as indicated.    Flint Hummer, PTA 05/10/2024, 4:32 PM

## 2024-05-12 ENCOUNTER — Ambulatory Visit: Admitting: Rehabilitative and Restorative Service Providers"

## 2024-05-12 ENCOUNTER — Encounter: Payer: Self-pay | Admitting: Rehabilitative and Restorative Service Providers"

## 2024-05-12 DIAGNOSIS — R29898 Other symptoms and signs involving the musculoskeletal system: Secondary | ICD-10-CM

## 2024-05-12 DIAGNOSIS — M6281 Muscle weakness (generalized): Secondary | ICD-10-CM

## 2024-05-12 DIAGNOSIS — M542 Cervicalgia: Secondary | ICD-10-CM

## 2024-05-12 NOTE — Therapy (Signed)
 OUTPATIENT PHYSICAL THERAPY CERVICAL TREATMENT and RECERTIFICATION   Patient Name: Alicia Booth MRN: [id number] DOB:1971-05-02, 53 y.o., female Today's Date: 05/12/2024  END OF SESSION:  PT End of Session - 05/12/24 1022     Visit Number 12    Number of Visits 20    Date for PT Re-Evaluation 06/11/24    Authorization Type aetna no copay    Authorization Time Period 60 VISITS PER YEAR    Authorization - Visit Number 12    Authorization - Number of Visits 60    PT Start Time 1020    PT Stop Time 1100    PT Time Calculation (min) 40 min    Activity Tolerance Patient tolerated treatment well    Behavior During Therapy WFL for tasks assessed/performed            Past Medical History:  Diagnosis Date   Diabetes (HCC)    High cholesterol    Hypertension    Stress-induced cardiomyopathy    Past Surgical History:  Procedure Laterality Date   BARIATRIC SURGERY  2014   BREAST BIOPSY Left    lymph node bx benign   HEEL SPUR EXCISION     LEFT HEART CATH AND CORONARY ANGIOGRAPHY N/A 04/11/2022   Procedure: LEFT HEART CATH AND CORONARY ANGIOGRAPHY;  Surgeon: Lucendia Rusk, MD;  Location: [facility];  Service: Cardiovascular;  Laterality: N/A;   MENISCUS REPAIR     tummy tuck     Patient Active Problem List   Diagnosis Date Noted   Other fatigue 07/25/2023   Seborrheic dermatitis of scalp 05/22/2023   Flexural atopic dermatitis 05/22/2023   Chronic diastolic congestive heart failure (HCC) 01/24/2023   Chronic vaginitis 10/24/2022   Hyperlipidemia associated with type 2 diabetes mellitus (HCC) 10/24/2022   Chronic tension-type headache, not intractable 05/23/2022   Trigger finger, left little finger 05/23/2022   Takotsubo cardiomyopathy 04/24/2022   S/P gastric sleeve procedure 04/24/2022   Adjustment disorder with mixed anxiety and depressed mood 04/24/2022   Perimenopausal symptom 04/24/2022   NSTEMI (non-ST elevated myocardial infarction) (HCC) 04/11/2022    Myopia with astigmatism and presbyopia, bilateral 03/19/2018   Obstructive sleep apnea (adult) (pediatric) 10/13/2012   Diabetes (HCC) 11/16/2009   Hypertension 11/16/2009    PCP: Nikki Barters, NP REFERRING PROVIDER: Dr Audie Bleacher REFERRING DIAG: Cervicalgia  THERAPY DIAG:  Cervicalgia  Other symptoms and signs involving the musculoskeletal system  Muscle weakness (generalized)  Rationale for Evaluation and Treatment: Rehabilitation  ONSET DATE: 01/10/24  SUBJECTIVE:  SUBJECTIVE STATEMENT: Patient feels improvement with therapy and feels the benefit of dry needling.   EVAL: Patient reports history of chronic  R neck and upper trap pain which have been present for the past 5 + years. She had some PT in the past with some improvement but continued to have pain and tightness. Symptoms have increased in the past 3-4 months with no known injury or accident. She describes symptoms as pain and tension in the R trap.   Hand dominance: Right  PERTINENT HISTORY:  MI 2023; valvular disease; takotsubo cardiomyopathy; sleep apnea; AODM; arthritis; anxiety; depression; herniorrhaphy; carpal tunnel surgery L 2024; carpal tunnel R; h/o barriatric surgery  PAIN:  Are you having pain? Yes: NPRS scale: 2/10 today Pain location: R cervical and upper trap area  Pain description: tight; aching; shocking  Aggravating factors: tension; bra strap over shoulder; work Relieving factors: meds; sleep  PRECAUTIONS: None  WEIGHT BEARING RESTRICTIONS: No  FALLS:  Has patient fallen in last 6 months? 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  DIAGNOSTIC FINDINGS:  02/12/24: cervical xray - Normal alignment. Moderate C6-C7 disc space narrowing and anterior spurring, mild C5-C6 anterior spurring and disc space narrowing. No evidence of fracture, focal bone abnormality or bony neural foraminal stenosis. No prevertebral soft tissue thickening.   IMPRESSION: Degenerative disc disease at C5-C6 and C6-C7.  PATIENT SURVEYS:  NDI 18/50; 36%  COGNITION: Overall cognitive status: Within functional limits for tasks assessed  SENSATION: Numbness bilat little fingers at night; sometimes numbness in the LE's   POSTURE: rounded shoulders, forward head, increased thoracic kyphosis, and UE in IR at side; scapulae abducted and rotated along the thoracic spine   PALPATION: Significant muscular tightness in R > L pecs; upper trap; leveator; ant/lat/posterior cervical musculature   CERVICAL ROM:  Active ROM A/PROM (deg) eval AROM 05/12/24  Flexion 50   Extension 50   Right lateral flexion 25 30  Left lateral flexion 25 30  Right rotation 62 tight  68 deg Pain at end range  Left rotation 60 pain R 72 deg   (Blank rows = not tested)  UPPER EXTREMITY ROM: WFL's some tightness at end range elevation bilat   UPPER EXTREMITY MMT: MMT Right eval Left eval  Shoulder flexion    Shoulder extension    Shoulder abduction    Shoulder adduction    Shoulder extension    Shoulder internal rotation    Shoulder external rotation    Middle trapezius 4+ 5  Lower trapezius 4 5  Elbow flexion    Elbow extension    Wrist flexion    Wrist extension    Wrist ulnar deviation    Wrist radial deviation    Wrist pronation    Wrist supination    Grip  strength     (Blank rows = not tested)  CERVICAL SPECIAL TESTS:  Upper limb tension test (ULTT): Positive, Spurling's test: Negative, and Distraction test: Negative   [facility] Adult PT Treatment:  DATE: 05/12/24 Therapeutic Exercise: R Cervical rotation provokes pain mid c-spine R side See ROM measurements above PROM into cervical flexion + rotation for upper c-spine mobility (this increased R upper cervical discomfort) Isometrics R sidebending (this decreases neck pain) Overpressure into lateral flexion  Manual Therapy: STM cervical multifidi, upper trap, bilateral levator, R suboccipitals Joint mobilization mid c-spine downglides grade II R and L, lateral glides grade II Trigger Point Dry Needling Subsequent Treatment: Instructions reviewed, if requested by the patient, prior to subsequent dry needling treatment.  Patient Verbal Consent Given: Yes Education Handout Provided: Previously Provided Muscles Treated: R and L upper trap, R levator, R cervical multifidi Electrical Stimulation Performed: No Treatment Response/Outcome: palpable twitch response, and lengthening   [facility] Adult PT Treatment:                                                DATE: 05/10/2024 Therapeutic Exercise: Seated UT (Rt) Thoracic rotation with forearms on wall UT & LS stretches with static cups (RT) Manual Therapy: Myofascial decompression with static and dynamic cupping Neuromuscular re-ed: T arms with back against wall --> ribcage stabilization + palm flip up/down Serratus wall slides with forearms on foam roller V arm lift off from wall Supine on foam roller" Angel arms Scissor arms + 1#DB Arm circles + 1#DB Hug <--> T arms + 1#DB Thoracic extension over black foam roller   [facility] Adult PT Treatment:                                                DATE: 04/29/24 Therapeutic Exercise: Standing L with theraband (red) x 20 reps W with red theraband x 20  reps Wall leans with thoracic opening R and L Therapeutic Activity:  Row blue TB 3 sec x 20  Shoulder extension blue TB 3 sec x 20 Bow and arrow blue TB 3 sec x 10 R/L   Manual Therapy: STM cervical multifidi, upper trap bilaterally, and R infraspinatus PROM/stretching cervical spine    [facility] Adult PT Treatment:                                                DATE: 04/26/24 Therapeutic Exercise: Standing L with theraband (red) x 10 reps W with theraand x 10 reps Wall leans with thoracic opening R and L Manual Therapy: STM cervical multifidi, upper trap bilaterally, and R infraspinatus Joint mobilization with PA mobs upper thoracic spine grade II Trigger Point Dry Needling Subsequent Treatment: Instructions reviewed, if requested by the patient, prior to subsequent dry needling treatment.  Patient Verbal Consent Given: Yes Education Handout Provided: Previously Provided Muscles Treated: upper trap R and L, R cervical multifidi, R infraspinatus Electrical Stimulation Performed: No Treatment Response/Outcome: palpable lengthening, twitch response    PATIENT EDUCATION:  Education details: DN handout Person educated: Patient Education method: Programmer, multimedia, Demonstration, Actor cues, Verbal cues, and Handouts Education comprehension: verbalized understanding, returned demonstration, verbal cues required, tactile cues required, and needs further education  HOME EXERCISE PROGRAM: Access Code: [email or web address] URL: [email or web address] Date: 05/10/2024 Prepared by: Sims Duck  Exercises - Seated Cervical Sidebending  Stretch  - 1 x daily - 5 x weekly - 1 sets - 3 reps - 20 seconds hold - Doorway Pec Stretch at 120 Degrees Abduction  - 1 x daily - 5 x weekly - 1 sets - 3 reps - 30 second hold  hold - Shoulder External Rotation and Scapular Retraction with Resistance  - 1 x daily - 5 x weekly - 1 sets - 10 reps - 3-5 sec  hold - Standing with Forearms Thoracic Rotation  - 1  x daily - 5 x weekly - 1 sets - 10 reps - Standing Bilateral Low Shoulder Row with Anchored Resistance  - 2 x daily - 7 x weekly - 1-3 sets - 10 reps - 2-3 sec  hold - Drawing Bow  - 1 x daily - 7 x weekly - 1 sets - 10 reps - 3 sec  hold - Shoulder extension with resistance - Neutral  - 1 x daily - 7 x weekly - 1-2 sets - 10 reps - 3-5 sec  hold - Thoracic Extension Mobilization on Foam Roll  - 1 x daily - 7 x weekly - 3 sets - 10 reps  ASSESSMENT: CLINICAL IMPRESSION:  The patient has met 2 STGs and 5 LTGs in physical therapy. PT is continuing to develop her HEP for spinal mobility, flexibility, and strengthening + soft tissue work to reduce trigger points. PT to renew x 4 more weeks to continue plan of care with updated goals noted below.  OBJECTIVE IMPAIRMENTS: decreased activity tolerance, decreased ROM, decreased strength, increased fascial restrictions, increased muscle spasms, impaired UE functional use, improper body mechanics, postural dysfunction, and pain.   GOALS: Goals reviewed with patient? Yes  SHORT TERM GOALS: Target date: 04/15/2024  Independent in initial HEP  Baseline:  Goal status: MET  2.  Decrease muscular tightness and pain by 25% allowing patient to sleep 3-4 hours without awakening due to pain  Baseline:  Goal status: MET-- she reports significant improvement since beginning PT.   LONG TERM GOALS: Target date: 05/06/2024  Decrease R cervical and upper trap tightness and pain by 75-80% allowing patient to improve functional level with work, leisure, sleep Baseline:  Goal status: MET  2.  Increase cervical ROM by 10-12 degrees in lateral flexion and 5-7 degrees in rotation  Baseline:  Goal status: PARTIALLY MET-- see chart above  3.  Improve posture and alignment with patient to demonstrate improved tissue extensibility through anterior chest/pecs and increased muscular engagement in posterior shoulder girdle  Baseline:  Goal status: MET  4.  Patient to  demonstrate and verbalize proper posture and alignment for sitting; sleeping; work tasks Baseline:  Goal status: MET-- able to catch her posture for correction  5.  Independent in HEP including aquatic program as indicated  Baseline:  Goal status: MET  6.  Improve NDI by 10 points  Baseline: 18/50; 36%  05/10/24: 24% Goal status: MET  UPDATED GOALS:  LONG TERM GOALS: Target date: 06/11/24  The patient will be indep with HEP progression. Baseline: has initial HEP Goal status: UPDATED  2.  Reduce pain to 0/10 with end range R cervical rotation.  Baseline:  Notes a focal area of pain with right rotation Goal status: NEW  3. Patient will report reduced frequency of waking with bilateral 5th digit numbness. Baseline:  Notes frequently waking with numbness. Goal status: NEW  4.  Patient will be able to walk x 15 minutes without neck heaviness or hand numbness. Baseline:  Notes  extended gait provokes a heavy "pulling down" sensation in neck/shoulders. Goal status: NEW  5. Improve NDI by 8%. .  Baseline: 18/50; 36% 05/10/24: 24% Goal status: UPDATED   PLAN:  PT FREQUENCY: 2x/week  PT DURATION: 6 weeks  PLANNED INTERVENTIONS: 97110-Therapeutic exercises, 97530- Therapeutic activity, 97112- Neuromuscular re-education, 97535- Self Care, 19147- Manual therapy, Patient/Family education, Taping, Dry Needling, and Joint mobilization  PLAN FOR NEXT SESSION: Edit down HEP. Postural strengthening, STM as indicated. Continue 2x/week (may use 1 session to add DN as needed).    March Joos, PT 05/12/2024, 11:55 AM

## 2024-05-17 ENCOUNTER — Encounter: Payer: Self-pay | Admitting: Rehabilitative and Restorative Service Providers"

## 2024-05-17 ENCOUNTER — Ambulatory Visit: Admitting: Rehabilitative and Restorative Service Providers"

## 2024-05-17 DIAGNOSIS — M6281 Muscle weakness (generalized): Secondary | ICD-10-CM

## 2024-05-17 DIAGNOSIS — R29898 Other symptoms and signs involving the musculoskeletal system: Secondary | ICD-10-CM

## 2024-05-17 DIAGNOSIS — M542 Cervicalgia: Secondary | ICD-10-CM | POA: Diagnosis not present

## 2024-05-17 NOTE — Therapy (Signed)
 OUTPATIENT PHYSICAL THERAPY CERVICAL TREATMENT  Patient Name: Alicia Booth MRN: [id number] DOB:Apr 11, 1971, 53 y.o., female Today's Date: 05/17/2024  END OF SESSION:  PT End of Session - 05/17/24 1148     Visit Number 13    Number of Visits 20    Date for PT Re-Evaluation 06/11/24    Authorization Type aetna no copay    Authorization Time Period 60 VISITS PER YEAR    Authorization - Number of Visits 60    PT Start Time 1148    PT Stop Time 1230    PT Time Calculation (min) 42 min    Activity Tolerance Patient tolerated treatment well    Behavior During Therapy WFL for tasks assessed/performed            Past Medical History:  Diagnosis Date   Diabetes (HCC)    High cholesterol    Hypertension    Stress-induced cardiomyopathy    Past Surgical History:  Procedure Laterality Date   BARIATRIC SURGERY  2014   BREAST BIOPSY Left    lymph node bx benign   HEEL SPUR EXCISION     LEFT HEART CATH AND CORONARY ANGIOGRAPHY N/A 04/11/2022   Procedure: LEFT HEART CATH AND CORONARY ANGIOGRAPHY;  Surgeon: Lucendia Rusk, MD;  Location: [facility];  Service: Cardiovascular;  Laterality: N/A;   MENISCUS REPAIR     tummy tuck     Patient Active Problem List   Diagnosis Date Noted   Other fatigue 07/25/2023   Seborrheic dermatitis of scalp 05/22/2023   Flexural atopic dermatitis 05/22/2023   Chronic diastolic congestive heart failure (HCC) 01/24/2023   Chronic vaginitis 10/24/2022   Hyperlipidemia associated with type 2 diabetes mellitus (HCC) 10/24/2022   Chronic tension-type headache, not intractable 05/23/2022   Trigger finger, left little finger 05/23/2022   Takotsubo cardiomyopathy 04/24/2022   S/P gastric sleeve procedure 04/24/2022   Adjustment disorder with mixed anxiety and depressed mood 04/24/2022   Perimenopausal symptom 04/24/2022   NSTEMI (non-ST elevated myocardial infarction) (HCC) 04/11/2022   Myopia with astigmatism and presbyopia, bilateral  03/19/2018   Obstructive sleep apnea (adult) (pediatric) 10/13/2012   Diabetes (HCC) 11/16/2009   Hypertension 11/16/2009    PCP: Nikki Barters, NP REFERRING PROVIDER: Dr Audie Bleacher REFERRING DIAG: Cervicalgia  THERAPY DIAG:  Cervicalgia  Other symptoms and signs involving the musculoskeletal system  Muscle weakness (generalized)  Rationale for Evaluation and Treatment: Rehabilitation  ONSET DATE: 01/10/24  SUBJECTIVE:  SUBJECTIVE STATEMENT: The patient had pain after last session reporting a headache and soreness in the muscles we dry needled-- she feels it is more grip for palpation during DN and less the needles. She is tired from her weekend-- traveled to [place].   EVAL: Patient reports history of chronic  R neck and upper trap pain which have been present for the past 5 + years. She had some PT in the past with some improvement but continued to have pain and tightness. Symptoms have increased in the past 3-4 months with no known injury or accident. She describes symptoms as pain and tension in the R trap.   Hand dominance: Right  PERTINENT HISTORY:  MI 2023; valvular disease; takotsubo cardiomyopathy; sleep apnea; AODM; arthritis; anxiety; depression; herniorrhaphy; carpal tunnel surgery L 2024; carpal tunnel R; h/o barriatric surgery  PAIN:  Are you having pain? Yes: NPRS scale: 2/10 today Pain location: R cervical and upper trap area  Pain description: tight; aching; shocking  Aggravating factors: tension; bra strap over shoulder; work Relieving factors: meds; sleep  PRECAUTIONS: None  WEIGHT BEARING RESTRICTIONS: No  FALLS:  Has patient fallen in last 6 months? 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  DIAGNOSTIC FINDINGS:  02/12/24: cervical xray - Normal alignment. Moderate C6-C7 disc space narrowing and anterior spurring, mild C5-C6 anterior spurring and disc space narrowing. No evidence of fracture, focal bone abnormality or bony neural foraminal stenosis. No prevertebral soft tissue thickening.   IMPRESSION: Degenerative disc disease at C5-C6 and C6-C7.  PATIENT SURVEYS:  NDI 18/50; 36%  COGNITION: Overall cognitive status: Within functional limits for tasks assessed  SENSATION: Numbness bilat little fingers at night; sometimes numbness in the LE's   POSTURE: rounded shoulders, forward head, increased thoracic kyphosis, and UE in IR at side; scapulae abducted and rotated along the thoracic spine   PALPATION: Significant muscular tightness in R > L pecs; upper trap; leveator; ant/lat/posterior cervical musculature   CERVICAL ROM:  Active ROM A/PROM (deg) eval AROM 05/12/24  Flexion 50   Extension 50   Right lateral flexion 25 30  Left lateral flexion 25 30  Right rotation 62 tight  68 deg Pain at end range  Left rotation 60 pain R 72 deg   (Blank rows = not tested)  UPPER EXTREMITY ROM: WFL's some tightness at end range elevation bilat   UPPER EXTREMITY MMT: MMT Right eval Left eval  Shoulder flexion    Shoulder extension    Shoulder abduction    Shoulder adduction    Shoulder extension    Shoulder internal rotation    Shoulder external rotation    Middle trapezius 4+ 5  Lower trapezius 4 5  Elbow flexion    Elbow extension    Wrist flexion    Wrist extension     Wrist ulnar deviation    Wrist radial deviation    Wrist pronation    Wrist supination    Grip strength     (Blank rows = not tested)  CERVICAL SPECIAL TESTS:  Upper limb  tension test (ULTT): Positive, Spurling's test: Negative, and Distraction test: Negative  [facility] Adult PT Treatment:                                                DATE: 05/17/24 Therapeutic Exercise: Supine Foam roller for thoracic opening Ball head nods x 10 reps Prone on elbows Chin tuck with head rotation Diagonal head nods Prone T R x 12 reps-- added to HEP Standing Wall lean thoracic Scapular stability with red band ER shoulders (red band) x 5 reps-- feels in pec Horizontal abduction x 10 reps with red band-- feels in pectoralis Wall lean thoracic mobilization Band for rows and for shoulder extension Rolling foam roller up/down wall for serratus iwht red band Sitting Lateral flexion neck SCM stretch-- gets R suboccipital spasm Quadriped Thread the needle x 3 reps-- feels discomfort in SI and wrists Cat/cow x 6 reps  [facility] Adult PT Treatment:                                                DATE: 05/12/24 Therapeutic Exercise: R Cervical rotation provokes pain mid c-spine R side See ROM measurements above PROM into cervical flexion + rotation for upper c-spine mobility (this increased R upper cervical discomfort) Isometrics R sidebending (this decreases neck pain) Overpressure into lateral flexion  Manual Therapy: STM cervical multifidi, upper trap, bilateral levator, R suboccipitals Joint mobilization mid c-spine downglides grade II R and L, lateral glides grade II Trigger Point Dry Needling Subsequent Treatment: Instructions reviewed, if requested by the patient, prior to subsequent dry needling treatment.  Patient Verbal Consent Given: Yes Education Handout Provided: Previously Provided Muscles Treated: R and L upper trap, R levator, R cervical multifidi Electrical Stimulation Performed:  No Treatment Response/Outcome: palpable twitch response, and lengthening   [facility] Adult PT Treatment:                                                DATE: 05/10/2024 Therapeutic Exercise: Seated UT (Rt) Thoracic rotation with forearms on wall UT & LS stretches with static cups (RT) Manual Therapy: Myofascial decompression with static and dynamic cupping Neuromuscular re-ed: T arms with back against wall --> ribcage stabilization + palm flip up/down Serratus wall slides with forearms on foam roller V arm lift off from wall Supine on foam roller" Angel arms Scissor arms + 1#DB Arm circles + 1#DB Hug <--> T arms + 1#DB Thoracic extension over black foam roller  PATIENT EDUCATION:  Education details: DN handout Person educated: Patient Education method: Programmer, multimedia, Facilities manager, Actor cues, Verbal cues, and Handouts Education comprehension: verbalized understanding, returned demonstration, verbal cues required, tactile cues required, and needs further education  HOME EXERCISE PROGRAM: Access Code: [email or web address] URL: [email or web address] Date: 05/17/2024 Prepared by: Trygve Gage  Exercises - Seated Cervical Sidebending Stretch  - 1 x daily - 5 x weekly - 1 sets - 3 reps - 20 seconds hold - Doorway Pec Stretch at 90 Degrees Abduction  - 1 x daily - 5 x weekly - 1 sets - 3 reps - 20 seconds hold - Standing with Forearms  Thoracic Rotation  - 1 x daily - 5 x weekly - 1 sets - 10 reps - Prone Shoulder Horizontal Abduction with Thumbs Up  - 1 x daily - 5 x weekly - 2 sets - 12 reps - Quadruped Full Range Thoracic Rotation with Reach  - 1 x daily - 5 x weekly - 1 sets - 5-8 reps  ASSESSMENT: CLINICAL IMPRESSION:  With review of HEP, the patient demonstrates R upper trap actively over-engaging and she doesn't feel scapular stabilizers during standing rows, arrow, shoulder extension or shoulder ER with band. Therefore, PT modified adding weight bearing through [place] in  quadriped (this fatigues shoulders), prone scapular exercise "T", and attempted others working to reduce firing of R upper trap. Plan to update and add as able to isolate scapular musculature.  OBJECTIVE IMPAIRMENTS: decreased activity tolerance, decreased ROM, decreased strength, increased fascial restrictions, increased muscle spasms, impaired UE functional use, improper body mechanics, postural dysfunction, and pain.   GOALS: Goals reviewed with patient? Yes  UPDATED GOALS:  LONG TERM GOALS: Target date: 06/11/24  The patient will be indep with HEP progression. Baseline: has initial HEP Goal status: UPDATED  2.  Reduce pain to 0/10 with end range R cervical rotation.  Baseline:  Notes a focal area of pain with right rotation Goal status: NEW  3. Patient will report reduced frequency of waking with bilateral 5th digit numbness. Baseline:  Notes frequently waking with numbness. Goal status: NEW  4.  Patient will be able to walk x 15 minutes without neck heaviness or hand numbness. Baseline:  Notes extended gait provokes a heavy "pulling down" sensation in neck/shoulders. Goal status: NEW  5. Improve NDI by 8%. .  Baseline: 18/50; 36% 05/10/24: 24% Goal status: UPDATED   PLAN:  PT FREQUENCY: 2x/week  PT DURATION: 6 weeks  PLANNED INTERVENTIONS: 97110-Therapeutic exercises, 97530- Therapeutic activity, 97112- Neuromuscular re-education, 97535- Self Care, [id number]- Manual therapy, Patient/Family education, Taping, Dry Needling, and Joint mobilization  PLAN FOR NEXT SESSION: Postural strengthening, STM as indicated. Continue 2x/week (may use 1 session to add DN as needed). *Patient also needs isolated scapular strengthening-- she is so accustomed to overcompensating with R upper trap.   Fawaz Borquez, PT 05/17/2024, 11:48 AM

## 2024-05-19 ENCOUNTER — Encounter: Payer: Self-pay | Admitting: Rehabilitative and Restorative Service Providers"

## 2024-05-19 ENCOUNTER — Ambulatory Visit

## 2024-05-19 DIAGNOSIS — R29898 Other symptoms and signs involving the musculoskeletal system: Secondary | ICD-10-CM

## 2024-05-19 DIAGNOSIS — M542 Cervicalgia: Secondary | ICD-10-CM | POA: Diagnosis not present

## 2024-05-19 DIAGNOSIS — M6281 Muscle weakness (generalized): Secondary | ICD-10-CM

## 2024-05-19 NOTE — Therapy (Signed)
 OUTPATIENT PHYSICAL THERAPY CERVICAL TREATMENT  Patient Name: Alicia Booth MRN: [id number] DOB:10-31-1971, 53 y.o., female Today's Date: 05/19/2024  END OF SESSION:  PT End of Session - 05/19/24 1539     Visit Number 14    Number of Visits 20    Date for PT Re-Evaluation 06/11/24    Authorization Type aetna no copay    Authorization Time Period 60 VISITS PER YEAR    Authorization - Visit Number 14    Authorization - Number of Visits 60    PT Start Time 1539    PT Stop Time 1624    PT Time Calculation (min) 45 min    Activity Tolerance Patient tolerated treatment well    Behavior During Therapy WFL for tasks assessed/performed            Past Medical History:  Diagnosis Date   Diabetes (HCC)    High cholesterol    Hypertension    Stress-induced cardiomyopathy    Past Surgical History:  Procedure Laterality Date   BARIATRIC SURGERY  2014   BREAST BIOPSY Left    lymph node bx benign   HEEL SPUR EXCISION     LEFT HEART CATH AND CORONARY ANGIOGRAPHY N/A 04/11/2022   Procedure: LEFT HEART CATH AND CORONARY ANGIOGRAPHY;  Surgeon: Lucendia Rusk, MD;  Location: [facility];  Service: Cardiovascular;  Laterality: N/A;   MENISCUS REPAIR     tummy tuck     Patient Active Problem List   Diagnosis Date Noted   Other fatigue 07/25/2023   Seborrheic dermatitis of scalp 05/22/2023   Flexural atopic dermatitis 05/22/2023   Chronic diastolic congestive heart failure (HCC) 01/24/2023   Chronic vaginitis 10/24/2022   Hyperlipidemia associated with type 2 diabetes mellitus (HCC) 10/24/2022   Chronic tension-type headache, not intractable 05/23/2022   Trigger finger, left little finger 05/23/2022   Takotsubo cardiomyopathy 04/24/2022   S/P gastric sleeve procedure 04/24/2022   Adjustment disorder with mixed anxiety and depressed mood 04/24/2022   Perimenopausal symptom 04/24/2022   NSTEMI (non-ST elevated myocardial infarction) (HCC) 04/11/2022   Myopia with  astigmatism and presbyopia, bilateral 03/19/2018   Obstructive sleep apnea (adult) (pediatric) 10/13/2012   Diabetes (HCC) 11/16/2009   Hypertension 11/16/2009    PCP: Nikki Barters, NP REFERRING PROVIDER: Dr Audie Bleacher REFERRING DIAG: Cervicalgia  THERAPY DIAG:  Other symptoms and signs involving the musculoskeletal system  Muscle weakness (generalized)  Rationale for Evaluation and Treatment: Rehabilitation  ONSET DATE: 01/10/24  SUBJECTIVE:  SUBJECTIVE STATEMENT: Patient reports she did 5 exercises after work and has been more mindful of trap activation/relaxation.   EVAL: Patient reports history of chronic  R neck and upper trap pain which have been present for the past 5 + years. She had some PT in the past with some improvement but continued to have pain and tightness. Symptoms have increased in the past 3-4 months with no known injury or accident. She describes symptoms as pain and tension in the R trap.   Hand dominance: Right  PERTINENT HISTORY:  MI 2023; valvular disease; takotsubo cardiomyopathy; sleep apnea; AODM; arthritis; anxiety; depression; herniorrhaphy; carpal tunnel surgery L 2024; carpal tunnel R; h/o barriatric surgery  PAIN:  Are you having pain? Yes: NPRS scale: 2/10 today Pain location: R cervical and upper trap area  Pain description: tight; aching; shocking  Aggravating factors: tension; bra strap over shoulder; work Relieving factors: meds; sleep  PRECAUTIONS: None  WEIGHT BEARING RESTRICTIONS: No  FALLS:  Has patient fallen in last 6 months? 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  DIAGNOSTIC FINDINGS:  02/12/24: cervical xray - Normal alignment. Moderate C6-C7 disc space narrowing and anterior spurring, mild C5-C6 anterior spurring and disc space narrowing. No evidence of fracture, focal bone abnormality or bony neural foraminal stenosis. No prevertebral soft tissue thickening.   IMPRESSION: Degenerative disc disease at C5-C6 and C6-C7.  PATIENT SURVEYS:  NDI 18/50; 36%  COGNITION: Overall cognitive status: Within functional limits for tasks assessed  SENSATION: Numbness bilat little fingers at night; sometimes numbness in the LE's   POSTURE: rounded shoulders, forward head, increased thoracic kyphosis, and UE in IR at side; scapulae abducted and rotated along the thoracic spine   PALPATION: Significant muscular tightness in R > L pecs; upper trap; leveator; ant/lat/posterior cervical musculature   CERVICAL ROM:  Active ROM A/PROM (deg) eval AROM 05/12/24  Flexion 50   Extension 50   Right lateral flexion 25 30  Left lateral flexion 25 30  Right rotation 62 tight  68 deg Pain at end range  Left rotation 60 pain R 72 deg   (Blank rows = not tested)  UPPER EXTREMITY ROM: WFL's some tightness at end range elevation bilat   UPPER EXTREMITY MMT: MMT Right eval Left eval  Shoulder flexion    Shoulder extension    Shoulder abduction    Shoulder adduction    Shoulder extension    Shoulder internal rotation    Shoulder external rotation    Middle trapezius 4+ 5  Lower trapezius 4 5  Elbow flexion    Elbow extension    Wrist flexion    Wrist extension    Wrist ulnar deviation    Wrist radial deviation    Wrist pronation    Wrist supination    Grip  strength     (Blank rows = not tested)  CERVICAL SPECIAL TESTS:  Upper limb tension test (ULTT): Positive, Spurling's test: Negative, and Distraction test: Negative   [facility] Adult PT Treatment:  DATE: 05/19/2024 Therapeutic Exercise: Seated UT stretch with hand behind back Manual Therapy: Static cups on upper trap + UT stretch (Rt) Rock tape I strip for upper trap inhibition (Rt) Neuromuscular re-ed: Standing with back against wall: Shoulder flexion to 90 degrees + 1#dowel --> shoulder stability High bicep curls front and lateral + 1#DB Prone: W armlift --> scap retract Shoulder extension + 1#dowel 1#DB pass behind/overhead   [facility] Adult PT Treatment:                                                DATE: 05/17/24 Therapeutic Exercise: Supine Foam roller for thoracic opening Ball head nods x 10 reps Prone on elbows Chin tuck with head rotation Diagonal head nods Prone T R x 12 reps-- added to HEP Standing Wall lean thoracic Scapular stability with red band ER shoulders (red band) x 5 reps-- feels in pec Horizontal abduction x 10 reps with red band-- feels in pectoralis Wall lean thoracic mobilization Band for rows and for shoulder extension Rolling foam roller up/down wall for serratus iwht red band Sitting Lateral flexion neck SCM stretch-- gets R suboccipital spasm Quadriped Thread the needle x 3 reps-- feels discomfort in SI and wrists Cat/cow x 6 reps   [facility] Adult PT Treatment:                                                DATE: 05/12/24 Therapeutic Exercise: R Cervical rotation provokes pain mid c-spine R side See ROM measurements above PROM into cervical flexion + rotation for upper c-spine mobility (this increased R upper cervical discomfort) Isometrics R sidebending (this decreases neck pain) Overpressure into lateral flexion  Manual Therapy: STM cervical multifidi, upper trap, bilateral levator, R  suboccipitals Joint mobilization mid c-spine downglides grade II R and L, lateral glides grade II Trigger Point Dry Needling Subsequent Treatment: Instructions reviewed, if requested by the patient, prior to subsequent dry needling treatment.  Patient Verbal Consent Given: Yes Education Handout Provided: Previously Provided Muscles Treated: R and L upper trap, R levator, R cervical multifidi Electrical Stimulation Performed: No Treatment Response/Outcome: palpable twitch response, and lengthening    PATIENT EDUCATION:  Education details: DN handout Person educated: Patient Education method: Programmer, multimedia, Demonstration, Actor cues, Verbal cues, and Handouts Education comprehension: verbalized understanding, returned demonstration, verbal cues required, tactile cues required, and needs further education  HOME EXERCISE PROGRAM: Access Code: [email or web address] URL: [email or web address] Date: 05/17/2024 Prepared by: Trygve Gage  Exercises - Seated Cervical Sidebending Stretch  - 1 x daily - 5 x weekly - 1 sets - 3 reps - 20 seconds hold - Doorway Pec Stretch at 90 Degrees Abduction  - 1 x daily - 5 x weekly - 1 sets - 3 reps - 20 seconds hold - Standing with Forearms Thoracic Rotation  - 1 x daily - 5 x weekly - 1 sets - 10 reps - Prone Shoulder Horizontal Abduction with Thumbs Up  - 1 x daily - 5 x weekly - 2 sets - 12 reps - Quadruped Full Range Thoracic Rotation with Reach  - 1 x daily - 5 x weekly - 1 sets - 5-8 reps  ASSESSMENT: CLINICAL IMPRESSION:  Wall utilized to promote tactile cues for posterior  shoulder girdle stabilization with arm raises and bicep curls; patient demonstrated scapula stability with decreased upper trapezius compensation. Postural strengthening exercises progressed in prone, providing tactile cues as needed to decrease upper trap compensation. Kinesiotape applied to promote upper trap inhibition on right shoulder; will follow up on response at next  visit.   OBJECTIVE IMPAIRMENTS: decreased activity tolerance, decreased ROM, decreased strength, increased fascial restrictions, increased muscle spasms, impaired UE functional use, improper body mechanics, postural dysfunction, and pain.   GOALS: Goals reviewed with patient? Yes  UPDATED GOALS:  LONG TERM GOALS: Target date: 06/11/24  The patient will be indep with HEP progression. Baseline: has initial HEP Goal status: UPDATED  2.  Reduce pain to 0/10 with end range R cervical rotation.  Baseline:  Notes a focal area of pain with right rotation Goal status: NEW  3. Patient will report reduced frequency of waking with bilateral 5th digit numbness. Baseline:  Notes frequently waking with numbness. Goal status: NEW  4.  Patient will be able to walk x 15 minutes without neck heaviness or hand numbness. Baseline:  Notes extended gait provokes a heavy pulling down sensation in neck/shoulders. Goal status: NEW  5. Improve NDI by 8%. .  Baseline: 18/50; 36% 05/10/24: 24% Goal status: UPDATED   PLAN:  PT FREQUENCY: 2x/week  PT DURATION: 6 weeks  PLANNED INTERVENTIONS: 97110-Therapeutic exercises, 97530- Therapeutic activity, 97112- Neuromuscular re-education, 97535- Self Care, 16109- Manual therapy, Patient/Family education, Taping, Dry Needling, and Joint mobilization  PLAN FOR NEXT SESSION: Response to kinesiotape? Postural strengthening, STM as indicated. Continue 2x/week (may use 1 session to add DN as needed). *Patient also needs isolated scapular strengthening-- she is so accustomed to overcompensating with R upper trap.   Flint Hummer, PTA 05/19/2024, 4:31 PM

## 2024-05-24 ENCOUNTER — Other Ambulatory Visit: Payer: Self-pay | Admitting: Cardiology

## 2024-05-24 ENCOUNTER — Ambulatory Visit: Payer: 59 | Admitting: Cardiology

## 2024-05-26 ENCOUNTER — Ambulatory Visit

## 2024-05-26 DIAGNOSIS — M542 Cervicalgia: Secondary | ICD-10-CM | POA: Diagnosis not present

## 2024-05-26 DIAGNOSIS — M6281 Muscle weakness (generalized): Secondary | ICD-10-CM

## 2024-05-26 DIAGNOSIS — R29898 Other symptoms and signs involving the musculoskeletal system: Secondary | ICD-10-CM

## 2024-05-26 NOTE — Therapy (Signed)
 OUTPATIENT PHYSICAL THERAPY CERVICAL TREATMENT  Patient Name: Alicia Booth MRN: [id number] DOB:10-May-1971, 53 y.o., female Today's Date: 05/26/2024  END OF SESSION:  PT End of Session - 05/26/24 0849     Visit Number 15    Number of Visits 20    Date for PT Re-Evaluation 06/11/24    Authorization Type aetna no copay    Authorization Time Period 60 VISITS PER YEAR    Authorization - Visit Number 15    Authorization - Number of Visits 60    PT Start Time 0849    PT Stop Time 0929    PT Time Calculation (min) 40 min    Activity Tolerance Patient tolerated treatment well    Behavior During Therapy WFL for tasks assessed/performed         Past Medical History:  Diagnosis Date   Diabetes (HCC)    High cholesterol    Hypertension    Stress-induced cardiomyopathy    Past Surgical History:  Procedure Laterality Date   BARIATRIC SURGERY  2014   BREAST BIOPSY Left    lymph node bx benign   HEEL SPUR EXCISION     LEFT HEART CATH AND CORONARY ANGIOGRAPHY N/A 04/11/2022   Procedure: LEFT HEART CATH AND CORONARY ANGIOGRAPHY;  Surgeon: Lucendia Rusk, MD;  Location: [facility];  Service: Cardiovascular;  Laterality: N/A;   MENISCUS REPAIR     tummy tuck     Patient Active Problem List   Diagnosis Date Noted   Other fatigue 07/25/2023   Seborrheic dermatitis of scalp 05/22/2023   Flexural atopic dermatitis 05/22/2023   Chronic diastolic congestive heart failure (HCC) 01/24/2023   Chronic vaginitis 10/24/2022   Hyperlipidemia associated with type 2 diabetes mellitus (HCC) 10/24/2022   Chronic tension-type headache, not intractable 05/23/2022   Trigger finger, left little finger 05/23/2022   Takotsubo cardiomyopathy 04/24/2022   S/P gastric sleeve procedure 04/24/2022   Adjustment disorder with mixed anxiety and depressed mood 04/24/2022   Perimenopausal symptom 04/24/2022   NSTEMI (non-ST elevated myocardial infarction) (HCC) 04/11/2022   Myopia with astigmatism  and presbyopia, bilateral 03/19/2018   Obstructive sleep apnea (adult) (pediatric) 10/13/2012   Diabetes (HCC) 11/16/2009   Hypertension 11/16/2009    PCP: Nikki Barters, NP REFERRING PROVIDER: Dr Audie Bleacher REFERRING DIAG: Cervicalgia  THERAPY DIAG:  Other symptoms and signs involving the musculoskeletal system  Muscle weakness (generalized)  Cervicalgia  Rationale for Evaluation and Treatment: Rehabilitation  ONSET DATE: 01/10/24  SUBJECTIVE:  SUBJECTIVE STATEMENT: Patient reports she thinks the kinesiotape helped some with Rt upper trap tension.   EVAL: Patient reports history of chronic  R neck and upper trap pain which have been present for the past 5 + years. She had some PT in the past with some improvement but continued to have pain and tightness. Symptoms have increased in the past 3-4 months with no known injury or accident. She describes symptoms as pain and tension in the R trap.   Hand dominance: Right  PERTINENT HISTORY:  MI 2023; valvular disease; takotsubo cardiomyopathy; sleep apnea; AODM; arthritis; anxiety; depression; herniorrhaphy; carpal tunnel surgery L 2024; carpal tunnel R; h/o barriatric surgery  PAIN:  Are you having pain? Yes: NPRS scale: 2/10 today Pain location: R cervical and upper trap area  Pain description: tight; aching; shocking  Aggravating factors: tension; bra strap over shoulder; work Relieving factors: meds; sleep  PRECAUTIONS: None  WEIGHT BEARING RESTRICTIONS: No  FALLS:  Has patient fallen in last 6 months? 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  DIAGNOSTIC FINDINGS:  02/12/24: cervical xray - Normal alignment. Moderate C6-C7 disc space narrowing and anterior spurring, mild C5-C6 anterior spurring and disc space narrowing. No evidence of fracture, focal bone abnormality or bony neural foraminal stenosis. No prevertebral soft tissue thickening.   IMPRESSION: Degenerative disc disease at C5-C6 and C6-C7.  PATIENT SURVEYS:  NDI 18/50; 36%  COGNITION: Overall cognitive status: Within functional limits for tasks assessed  SENSATION: Numbness bilat little fingers at night; sometimes numbness in the LE's   POSTURE: rounded shoulders, forward head, increased thoracic kyphosis, and UE in IR at side; scapulae abducted and rotated along the thoracic spine   PALPATION: Significant muscular tightness in R > L pecs; upper trap; leveator; ant/lat/posterior cervical musculature   CERVICAL ROM:  Active ROM A/PROM (deg) eval AROM 05/12/24  Flexion 50   Extension 50   Right lateral flexion 25 30  Left lateral flexion 25 30  Right rotation 62 tight  68 deg Pain at end range  Left rotation 60 pain R 72 deg   (Blank rows = not tested)  UPPER EXTREMITY ROM: WFL's some tightness at end range elevation bilat   UPPER EXTREMITY MMT: MMT Right eval Left eval  Shoulder flexion    Shoulder extension    Shoulder abduction    Shoulder adduction    Shoulder extension    Shoulder internal rotation    Shoulder external rotation    Middle trapezius 4+ 5  Lower trapezius 4 5  Elbow flexion    Elbow extension    Wrist flexion    Wrist extension    Wrist ulnar deviation    Wrist radial deviation    Wrist pronation    Wrist supination    Grip strength     (Blank  rows = not tested)  CERVICAL SPECIAL TESTS:  Upper limb tension test (ULTT): Positive, Spurling's test: Negative, and Distraction test: Negative   [facility] Adult PT Treatment:  DATE: 05/26/2024 Therapeutic Exercise: 3-way pec stretch 3x30 Standing with forearms thoracic rotation Standing thread the needle stretch with foam roller Manual Therapy: Kinesiotape: Double I strips along thoracic paraspinals 30% tension I strip for Rt upper trap 30% tension Neuromuscular re-ed: Standing with back against wall: High bicep curls front and lateral + 1#DB Shaving + 1#DB Prone: W armlift --> scap retract x12 Shoulder abduction isometric hold at 90 degrees + 1#DB 3x10 T arm reach back to extension + 1#DB 2x5    [facility] Adult PT Treatment:                                                DATE: 05/19/2024 Therapeutic Exercise: Seated UT stretch with hand behind back Manual Therapy: Static cups on upper trap + UT stretch (Rt) Rock tape I strip for upper trap inhibition (Rt) Neuromuscular re-ed: Standing with back against wall: Shoulder flexion to 90 degrees + 1#dowel --> shoulder stability High bicep curls front and lateral + 1#DB Prone: W armlift --> scap retract Shoulder extension + 1#dowel 1#DB pass behind/overhead   [facility] Adult PT Treatment:                                                DATE: 05/17/24 Therapeutic Exercise: Supine Foam roller for thoracic opening Ball head nods x 10 reps Prone on elbows Chin tuck with head rotation Diagonal head nods Prone T R x 12 reps-- added to HEP Standing Wall lean thoracic Scapular stability with red band ER shoulders (red band) x 5 reps-- feels in pec Horizontal abduction x 10 reps with red band-- feels in pectoralis Wall lean thoracic mobilization Band for rows and for shoulder extension Rolling foam roller up/down wall for serratus iwht red band Sitting Lateral flexion neck SCM stretch-- gets R  suboccipital spasm Quadriped Thread the needle x 3 reps-- feels discomfort in SI and wrists Cat/cow x 6 reps   [facility] Adult PT Treatment:                                                DATE: 05/12/24 Therapeutic Exercise: R Cervical rotation provokes pain mid c-spine R side See ROM measurements above PROM into cervical flexion + rotation for upper c-spine mobility (this increased R upper cervical discomfort) Isometrics R sidebending (this decreases neck pain) Overpressure into lateral flexion  Manual Therapy: STM cervical multifidi, upper trap, bilateral levator, R suboccipitals Joint mobilization mid c-spine downglides grade II R and L, lateral glides grade II Trigger Point Dry Needling Subsequent Treatment: Instructions reviewed, if requested by the patient, prior to subsequent dry needling treatment.  Patient Verbal Consent Given: Yes Education Handout Provided: Previously Provided Muscles Treated: R and L upper trap, R levator, R cervical multifidi Electrical Stimulation Performed: No Treatment Response/Outcome: palpable twitch response, and lengthening    PATIENT EDUCATION:  Education details: DN handout Person educated: Patient Education method: Programmer, multimedia, Demonstration, Actor cues, Verbal cues, and Handouts Education comprehension: verbalized understanding, returned demonstration, verbal cues required, tactile cues required, and needs further education  HOME EXERCISE PROGRAM: Access Code: [email or web address] URL: [email or web address] Date:  05/17/2024 Prepared by: Trygve Gage  Exercises - Seated Cervical Sidebending Stretch  - 1 x daily - 5 x weekly - 1 sets - 3 reps - 20 seconds hold - Doorway Pec Stretch at 90 Degrees Abduction  - 1 x daily - 5 x weekly - 1 sets - 3 reps - 20 seconds hold - Standing with Forearms Thoracic Rotation  - 1 x daily - 5 x weekly - 1 sets - 10 reps - Prone Shoulder Horizontal Abduction with Thumbs Up  - 1 x daily - 5 x weekly - 2  sets - 12 reps - Quadruped Full Range Thoracic Rotation with Reach  - 1 x daily - 5 x weekly - 1 sets - 5-8 reps  ASSESSMENT: CLINICAL IMPRESSION:  Postural exercises continued with focus on scapular strengthening. Tactile cues provided as needed to improve postural awareness and alignment. Decreased upper trap compensation and tension noted on Rt side; patient quick to fatigue with isometric holds. Follow up on response to postural kinesiotaping at next visit.  OBJECTIVE IMPAIRMENTS: decreased activity tolerance, decreased ROM, decreased strength, increased fascial restrictions, increased muscle spasms, impaired UE functional use, improper body mechanics, postural dysfunction, and pain.   GOALS: Goals reviewed with patient? Yes  UPDATED GOALS:  LONG TERM GOALS: Target date: 06/11/24  The patient will be indep with HEP progression. Baseline: has initial HEP Goal status: UPDATED  2.  Reduce pain to 0/10 with end range R cervical rotation.  Baseline:  Notes a focal area of pain with right rotation Goal status: NEW  3. Patient will report reduced frequency of waking with bilateral 5th digit numbness. Baseline:  Notes frequently waking with numbness. Goal status: NEW  4.  Patient will be able to walk x 15 minutes without neck heaviness or hand numbness. Baseline:  Notes extended gait provokes a heavy pulling down sensation in neck/shoulders. Goal status: NEW  5. Improve NDI by 8%. .  Baseline: 18/50; 36% 05/10/24: 24% Goal status: UPDATED   PLAN:  PT FREQUENCY: 2x/week  PT DURATION: 6 weeks  PLANNED INTERVENTIONS: 97110-Therapeutic exercises, 97530- Therapeutic activity, 97112- Neuromuscular re-education, 97535- Self Care, 16109- Manual therapy, Patient/Family education, Taping, Dry Needling, and Joint mobilization  PLAN FOR NEXT SESSION: Response to k-tape? Postural strengthening, STM as indicated. Continue 2x/week (may use 1 session to add DN as needed). Isolated scapular  strengthening.  Flint Hummer, PTA 05/26/2024, 9:29 AM

## 2024-05-27 NOTE — Progress Notes (Signed)
 HPI: Follow-up Takotsubo cardiomyopathy.  Patient admitted May 2023 with non-ST elevation myocardial infarction.  Echocardiogram showed hypokinesis of the distal inferolateral wall and apex and mild left ventricular hypertrophy.  Cardiac catheterization showed ejection fraction 25 to 35% with no coronary disease, left ventricular end-diastolic pressure of 25 and findings felt consistent with stress-induced cardiomyopathy.  Follow-up echocardiogram August 2023 showed normal LV function, grade 1 diastolic dysfunction, mild to moderate left atrial enlargement.  Since last seen   Current Outpatient Medications  Medication Sig Dispense Refill   Fezolinetant  (VEOZAH ) 45 MG TABS Take 1 tablet (45 mg total) by mouth daily. 90 tablet 3   fluticasone  (FLONASE ) 50 MCG/ACT nasal spray Place into both nostrils as needed for allergies or rhinitis.     ketoconazole (NIZORAL) 2 % shampoo Apply 1 Application topically 2 (two) times a week. 120 mL 0   lisinopril  (ZESTRIL ) 40 MG tablet Take 1 tablet (40 mg total) by mouth at bedtime. 90 tablet 3   metoprolol  succinate (TOPROL -XL) 100 MG 24 hr tablet TAKE ONE TABLET BY MOUTH ONE TIME DAILY. TAKE WITH OR IMMEDIATELY FOLLOWING A MEAL 30 tablet 0   Multiple Vitamins-Minerals (ONE-A-DAY 50 PLUS PO) Take 1 tablet by mouth at bedtime.     naproxen  (NAPROSYN ) 500 MG tablet Bid prn pain 60 tablet 2   nitroGLYCERIN  (NITROSTAT ) 0.4 MG SL tablet Place 1 tablet (0.4 mg total) under the tongue every 5 (five) minutes x 3 doses as needed for chest pain. 25 tablet 12   rosuvastatin  (CRESTOR ) 40 MG tablet Take 1 tablet (40 mg total) by mouth daily. 90 tablet 3   Semaglutide , 1 MG/DOSE, (OZEMPIC , 1 MG/DOSE,) 4 MG/3ML SOPN Inject 1 mg into the skin once a week. 9 mL 1   spironolactone  (ALDACTONE ) 25 MG tablet Take 0.5 tablets (12.5 mg total) by mouth daily. 15 tablet 1   triamcinolone  ointment (KENALOG ) 0.1 % Apply 1 Application topically 2 (two) times daily. 30 g 0   venlafaxine   XR (EFFEXOR -XR) 37.5 MG 24 hr capsule Take 1 capsule (37.5 mg total) by mouth daily with breakfast. 90 capsule 3   No current facility-administered medications for this visit.     Past Medical History:  Diagnosis Date   Diabetes (HCC)    High cholesterol    Hypertension    Stress-induced cardiomyopathy     Past Surgical History:  Procedure Laterality Date   BARIATRIC SURGERY  2014   BREAST BIOPSY Left    lymph node bx benign   HEEL SPUR EXCISION     LEFT HEART CATH AND CORONARY ANGIOGRAPHY N/A 04/11/2022   Procedure: LEFT HEART CATH AND CORONARY ANGIOGRAPHY;  Surgeon: Dann Candyce RAMAN, MD;  Location: [facility];  Service: Cardiovascular;  Laterality: N/A;   MENISCUS REPAIR     tummy tuck      Social History   Socioeconomic History   Marital status: Single    Spouse name: Not on file   Number of children: Not on file   Years of education: Not on file   Highest education level: Not on file  Occupational History   Not on file  Tobacco Use   Smoking status: Never   Smokeless tobacco: Never  Vaping Use   Vaping status: Never Used  Substance and Sexual Activity   Alcohol use: Yes    Comment: occassionally   Drug use: Never   Sexual activity: Yes  Other Topics Concern   Not on file  Social History Narrative  Not on file   Social Drivers of Health   Financial Resource Strain: Not on file  Food Insecurity: Not on file  Transportation Needs: Not on file  Physical Activity: Not on file  Stress: Not on file  Social Connections: Not on file  Intimate Partner Violence: Low Risk  (01/20/2022)   Received from [facility]   Interpersonal Safety Assessment ED Visit    Signs/Symptoms of Abuse or Neglect?: No    Has anyone physically harmed you in the last 12 months?: No    Do you feel safe at home?: Yes    Family History  Problem Relation Age of Onset   Breast cancer Mother 12   Rashes / Skin problems Father    Parkinson's disease  Father    Heart attack Maternal Grandmother 34   Alzheimer's disease Maternal Grandfather     ROS: no fevers or chills, productive cough, hemoptysis, dysphasia, odynophagia, melena, hematochezia, dysuria, hematuria, rash, seizure activity, orthopnea, PND, pedal edema, claudication. Remaining systems are negative.  Physical Exam: Well-developed well-nourished in no acute distress.  Skin is warm and dry.  HEENT is normal.  Neck is supple.  Chest is clear to auscultation with normal expansion.  Cardiovascular exam is regular rate and rhythm.  Abdominal exam nontender or distended. No masses palpated. Extremities show no edema. neuro grossly intact  EKG Interpretation Date/Time:  Monday June 07 2024 08:24:39 EDT Ventricular Rate:  73 PR Interval:  138 QRS Duration:  74 QT Interval:  384 QTC Calculation: 423 R Axis:   8  Text Interpretation: Normal sinus rhythm Nonspecific T wave abnormality Confirmed by Pietro Rogue ([id number]) on 06/07/2024 8:42:24 AM    A/P  1 stress-induced cardiomyopathy-LV function has improved on most recent echocardiogram.  Continue present medications.  2 hypertension-patient's blood pressure is controlled today.  Continue present medications.  3 hyperlipidemia-continue statin.  Rogue Pietro, MD

## 2024-05-28 ENCOUNTER — Ambulatory Visit: Admitting: Rehabilitative and Restorative Service Providers"

## 2024-05-28 ENCOUNTER — Encounter: Payer: Self-pay | Admitting: Rehabilitative and Restorative Service Providers"

## 2024-05-28 DIAGNOSIS — M542 Cervicalgia: Secondary | ICD-10-CM

## 2024-05-28 DIAGNOSIS — M6281 Muscle weakness (generalized): Secondary | ICD-10-CM

## 2024-05-28 DIAGNOSIS — R29898 Other symptoms and signs involving the musculoskeletal system: Secondary | ICD-10-CM

## 2024-05-28 NOTE — Therapy (Signed)
 OUTPATIENT PHYSICAL THERAPY CERVICAL TREATMENT  Patient Name: Alicia Booth MRN: [id number] DOB:1971/04/28, 53 y.o., female Today's Date: 05/28/2024  END OF SESSION:  PT End of Session - 05/28/24 0933     Visit Number 16    Number of Visits 20    Date for PT Re-Evaluation 06/11/24    Authorization Type aetna no copay    Authorization Time Period 60 VISITS PER YEAR    Authorization - Number of Visits 60    PT Start Time 0934    PT Stop Time 1015    PT Time Calculation (min) 41 min    Activity Tolerance Patient tolerated treatment well    Behavior During Therapy WFL for tasks assessed/performed         Past Medical History:  Diagnosis Date   Diabetes (HCC)    High cholesterol    Hypertension    Stress-induced cardiomyopathy    Past Surgical History:  Procedure Laterality Date   BARIATRIC SURGERY  2014   BREAST BIOPSY Left    lymph node bx benign   HEEL SPUR EXCISION     LEFT HEART CATH AND CORONARY ANGIOGRAPHY N/A 04/11/2022   Procedure: LEFT HEART CATH AND CORONARY ANGIOGRAPHY;  Surgeon: Lucendia Rusk, MD;  Location: [facility];  Service: Cardiovascular;  Laterality: N/A;   MENISCUS REPAIR     tummy tuck     Patient Active Problem List   Diagnosis Date Noted   Other fatigue 07/25/2023   Seborrheic dermatitis of scalp 05/22/2023   Flexural atopic dermatitis 05/22/2023   Chronic diastolic congestive heart failure (HCC) 01/24/2023   Chronic vaginitis 10/24/2022   Hyperlipidemia associated with type 2 diabetes mellitus (HCC) 10/24/2022   Chronic tension-type headache, not intractable 05/23/2022   Trigger finger, left little finger 05/23/2022   Takotsubo cardiomyopathy 04/24/2022   S/P gastric sleeve procedure 04/24/2022   Adjustment disorder with mixed anxiety and depressed mood 04/24/2022   Perimenopausal symptom 04/24/2022   NSTEMI (non-ST elevated myocardial infarction) (HCC) 04/11/2022   Myopia with astigmatism and presbyopia, bilateral 03/19/2018    Obstructive sleep apnea (adult) (pediatric) 10/13/2012   Diabetes (HCC) 11/16/2009   Hypertension 11/16/2009    PCP: Nikki Barters, NP REFERRING PROVIDER: Dr Audie Bleacher REFERRING DIAG: Cervicalgia  THERAPY DIAG:  Other symptoms and signs involving the musculoskeletal system  Muscle weakness (generalized)  Cervicalgia  Rationale for Evaluation and Treatment: Rehabilitation  ONSET DATE: 01/10/24  SUBJECTIVE:  SUBJECTIVE STATEMENT: The patient feels herself tightening with walking and working. She notes the kinesiotape helps. She was sore with Wednesday's session. She frequently has morning discomfort in the R side of her neck.   EVAL: Patient reports history of chronic  R neck and upper trap pain which have been present for the past 5 + years. She had some PT in the past with some improvement but continued to have pain and tightness. Symptoms have increased in the past 3-4 months with no known injury or accident. She describes symptoms as pain and tension in the R trap.   Hand dominance: Right  PERTINENT HISTORY:  MI 2023; valvular disease; takotsubo cardiomyopathy; sleep apnea; AODM; arthritis; anxiety; depression; herniorrhaphy; carpal tunnel surgery L 2024; carpal tunnel R; h/o barriatric surgery  PAIN:  Are you having pain? Yes: NPRS scale: 2/10 today Pain location: R cervical and upper trap area  Pain description: tight; aching; shocking  Aggravating factors: tension; bra strap over shoulder; work Relieving factors: meds; sleep  PRECAUTIONS: None  WEIGHT BEARING RESTRICTIONS: No  FALLS:  Has patient fallen in last 6 months? 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  DIAGNOSTIC FINDINGS:  02/12/24: cervical xray - Normal alignment. Moderate C6-C7 disc space narrowing and anterior spurring, mild C5-C6 anterior spurring and disc space narrowing. No evidence of fracture, focal bone abnormality or bony neural foraminal stenosis. No prevertebral soft tissue thickening.   IMPRESSION: Degenerative disc disease at C5-C6 and C6-C7.  PATIENT SURVEYS:  NDI 18/50; 36%  COGNITION: Overall cognitive status: Within functional limits for tasks assessed  SENSATION: Numbness bilat little fingers at night; sometimes numbness in the LE's   POSTURE: rounded shoulders, forward head, increased thoracic kyphosis, and UE in IR at side; scapulae abducted and rotated along the thoracic spine   PALPATION: Significant muscular tightness in R > L pecs; upper trap; leveator; ant/lat/posterior cervical musculature   CERVICAL ROM:  Active ROM A/PROM (deg) eval AROM 05/12/24  Flexion 50   Extension 50   Right lateral flexion 25 30  Left lateral flexion 25 30  Right rotation 62 tight  68 deg Pain at end range  Left rotation 60 pain R 72 deg   (Blank rows = not tested)  UPPER EXTREMITY ROM: WFL's some tightness at end range elevation bilat   UPPER EXTREMITY MMT: MMT Right eval Left eval  Shoulder flexion    Shoulder extension    Shoulder abduction    Shoulder adduction    Shoulder extension    Shoulder internal rotation    Shoulder external rotation    Middle trapezius 4+ 5  Lower trapezius 4 5  Elbow flexion    Elbow extension    Wrist flexion    Wrist extension    Wrist ulnar deviation    Wrist radial  deviation    Wrist pronation    Wrist supination    Grip strength     (Blank rows = not tested)  CERVICAL SPECIAL TESTS:  Upper limb tension test (ULTT): Positive, Spurling's test: Negative, and Distraction  test: Negative   [facility] Adult PT Treatment:                                                DATE: 05/28/24 Therapeutic Exercise: Prone Chin tuck for postural alignment On elbows with R and L rotation adding head nods for end range motion Sidelying Head lateral tilt lifting off pillow x 5 reps feels like it needs to pop and notes increased pain when laterally flexing to R, can tolerate on L x 5 reps Supine Neck AROM after increased pain from sidelying activities-- rotation feels blocked to the right side Isometrics into sidebending and rotation-- this reduces end range pain R rotation Manual Therapy: STM cervical paraspinals and suboccipitals, upper trap Contract relax used for cervical ROM and stretching Joint mobilizations lateral glides R>L in L sidelying  Neuromuscular re-ed: Prone W arm lift for scapular retraction x 12 reps T with 1# weights x 12 reps with tactile cues Shoulder extension x 12 reps with 1# reaching towards feet   [facility] Adult PT Treatment:                                                DATE: 05/26/2024 Therapeutic Exercise: 3-way pec stretch 3x30 Standing with forearms thoracic rotation Standing thread the needle stretch with foam roller Manual Therapy: Kinesiotape: Double I strips along thoracic paraspinals 30% tension I strip for Rt upper trap 30% tension Neuromuscular re-ed: Standing with back against wall: High bicep curls front and lateral + 1#DB Shaving + 1#DB Prone: W armlift --> scap retract x12 Shoulder abduction isometric hold at 90 degrees + 1#DB 3x10 T arm reach back to extension + 1#DB 2x5    [facility] Adult PT Treatment:                                                DATE: 05/19/2024 Therapeutic Exercise: Seated UT stretch with hand  behind back Manual Therapy: Static cups on upper trap + UT stretch (Rt) Rock tape I strip for upper trap inhibition (Rt) Neuromuscular re-ed: Standing with back against wall: Shoulder flexion to 90 degrees + 1#dowel --> shoulder stability High bicep curls front and lateral + 1#DB Prone: W armlift --> scap retract Shoulder extension + 1#dowel 1#DB pass behind/overhead   [facility] Adult PT Treatment:                                                DATE: 05/17/24 Therapeutic Exercise: Supine Foam roller for thoracic opening Ball head nods x 10 reps Prone on elbows Chin tuck with head rotation Diagonal head nods Prone T R x 12 reps-- added to HEP Standing Wall lean thoracic Scapular stability with red band ER shoulders (red band) x 5 reps-- feels in pec Horizontal abduction x 10 reps with red band-- feels in pectoralis Wall lean thoracic mobilization Band for rows and for shoulder extension Rolling foam roller up/down wall for serratus iwht red band Sitting Lateral  flexion neck SCM stretch-- gets R suboccipital spasm Quadriped Thread the needle x 3 reps-- feels discomfort in SI and wrists Cat/cow x 6 reps   PATIENT EDUCATION:  Education details: DN handout Person educated: Patient Education method: Explanation, Demonstration, Tactile cues, Verbal cues, and Handouts Education comprehension: verbalized understanding, returned demonstration, verbal cues required, tactile cues required, and needs further education  HOME EXERCISE PROGRAM: Access Code: [email or web address] URL: [email or web address] Date: 05/17/2024 Prepared by: Trygve Gage  Exercises - Seated Cervical Sidebending Stretch  - 1 x daily - 5 x weekly - 1 sets - 3 reps - 20 seconds hold - Doorway Pec Stretch at 90 Degrees Abduction  - 1 x daily - 5 x weekly - 1 sets - 3 reps - 20 seconds hold - Standing with Forearms Thoracic Rotation  - 1 x daily - 5 x weekly - 1 sets - 10 reps - Prone Shoulder Horizontal  Abduction with Thumbs Up  - 1 x daily - 5 x weekly - 2 sets - 12 reps - Quadruped Full Range Thoracic Rotation with Reach  - 1 x daily - 5 x weekly - 1 sets - 5-8 reps  ASSESSMENT: CLINICAL IMPRESSION:  The patient is tolerating scapular strengthening well. She reports pain when waking prone and rotating her head to the right. PT worked on prone spinal lengthening + rotation. Patient did not tolerate lateral flexion to the R with exacerbation of pain. Plan to continue to work towards LTGs focusing on postural strengthening.   OBJECTIVE IMPAIRMENTS: decreased activity tolerance, decreased ROM, decreased strength, increased fascial restrictions, increased muscle spasms, impaired UE functional use, improper body mechanics, postural dysfunction, and pain.   GOALS: Goals reviewed with patient? Yes  UPDATED GOALS:  LONG TERM GOALS: Target date: 06/11/24  The patient will be indep with HEP progression. Baseline: has initial HEP Goal status: UPDATED  2.  Reduce pain to 0/10 with end range R cervical rotation.  Baseline:  Notes a focal area of pain with right rotation Goal status: NEW  3. Patient will report reduced frequency of waking with bilateral 5th digit numbness. Baseline:  Notes frequently waking with numbness. Goal status: NEW  4.  Patient will be able to walk x 15 minutes without neck heaviness or hand numbness. Baseline:  Notes extended gait provokes a heavy pulling down sensation in neck/shoulders. Goal status: NEW  5. Improve NDI by 8%. .  Baseline: 18/50; 36% 05/10/24: 24% Goal status: UPDATED   PLAN:  PT FREQUENCY: 2x/week  PT DURATION: 6 weeks  PLANNED INTERVENTIONS: 97110-Therapeutic exercises, 97530- Therapeutic activity, 97112- Neuromuscular re-education, 97535- Self Care, 62130- Manual therapy, Patient/Family education, Taping, Dry Needling, and Joint mobilization  PLAN FOR NEXT SESSION: Work on prone thoracic extension, continue postural strengthening, STM as  indicated. Continue 2x/week (may use 1 session to add DN as needed). Isolated scapular strengthening. Consider K tape R upper trap and and levator + postural cues.  Rhett Mutschler, PT 05/28/2024, 9:34 AM

## 2024-06-07 ENCOUNTER — Encounter: Payer: Self-pay | Admitting: Cardiology

## 2024-06-07 ENCOUNTER — Encounter: Payer: Self-pay | Admitting: Rehabilitative and Restorative Service Providers"

## 2024-06-07 ENCOUNTER — Ambulatory Visit: Admitting: Cardiology

## 2024-06-07 ENCOUNTER — Ambulatory Visit: Admitting: Rehabilitative and Restorative Service Providers"

## 2024-06-07 VITALS — BP 102/60 | HR 73 | Ht 67.0 in | Wt 185.0 lb

## 2024-06-07 DIAGNOSIS — E7800 Pure hypercholesterolemia, unspecified: Secondary | ICD-10-CM | POA: Diagnosis not present

## 2024-06-07 DIAGNOSIS — R29898 Other symptoms and signs involving the musculoskeletal system: Secondary | ICD-10-CM

## 2024-06-07 DIAGNOSIS — I10 Essential (primary) hypertension: Secondary | ICD-10-CM

## 2024-06-07 DIAGNOSIS — M6281 Muscle weakness (generalized): Secondary | ICD-10-CM

## 2024-06-07 DIAGNOSIS — M542 Cervicalgia: Secondary | ICD-10-CM | POA: Diagnosis not present

## 2024-06-07 DIAGNOSIS — I5181 Takotsubo syndrome: Secondary | ICD-10-CM | POA: Diagnosis not present

## 2024-06-07 NOTE — Therapy (Signed)
 OUTPATIENT PHYSICAL THERAPY CERVICAL TREATMENT  Patient Name: Alicia Booth MRN: [id number] DOB:[birth year], 53 y.o., female Today's Date: 06/07/2024  END OF SESSION:  PT End of Session - 06/07/24 1103     Visit Number 17    Number of Visits 20    Date for PT Re-Evaluation 06/11/24    Authorization Type [employer] no copay    Authorization Time Period 60 VISITS PER YEAR    Authorization - Number of Visits 60    PT Start Time 1103    PT Stop Time 1145    PT Time Calculation (min) 42 min    Activity Tolerance Patient tolerated treatment well    Behavior During Therapy WFL for tasks assessed/performed          Past Medical History:  Diagnosis Date   Diabetes (HCC)    High cholesterol    Hypertension    Stress-induced cardiomyopathy    Past Surgical History:  Procedure Laterality Date   BARIATRIC SURGERY  2014   BREAST BIOPSY Left    lymph node bx benign   HEEL SPUR EXCISION     LEFT HEART CATH AND CORONARY ANGIOGRAPHY N/A 04/11/2022   Procedure: LEFT HEART CATH AND CORONARY ANGIOGRAPHY;  Surgeon: Dann Candyce RAMAN, MD;  Location: [facility];  Service: Cardiovascular;  Laterality: N/A;   MENISCUS REPAIR     tummy tuck     Patient Active Problem List   Diagnosis Date Noted   Other fatigue 07/25/2023   Seborrheic dermatitis of scalp 05/22/2023   Flexural atopic dermatitis 05/22/2023   Chronic diastolic congestive heart failure (HCC) 01/24/2023   Chronic vaginitis 10/24/2022   Hyperlipidemia associated with type 2 diabetes mellitus (HCC) 10/24/2022   Chronic tension-type headache, not intractable 05/23/2022   Trigger finger, left little finger 05/23/2022   Takotsubo cardiomyopathy 04/24/2022   S/P gastric sleeve procedure 04/24/2022   Adjustment disorder with mixed anxiety and depressed mood 04/24/2022   Perimenopausal symptom 04/24/2022   NSTEMI (non-ST elevated myocardial infarction) (HCC) 04/11/2022   Myopia with astigmatism and presbyopia, bilateral 03/19/2018    Obstructive sleep apnea (adult) (pediatric) 10/13/2012   Diabetes (HCC) 11/16/2009   Hypertension 11/16/2009    PCP: Roselie Bishop Peek, NP REFERRING PROVIDER: Dr Rockey Peru REFERRING DIAG: Cervicalgia  THERAPY DIAG:  Other symptoms and signs involving the musculoskeletal system  Muscle weakness (generalized)  Cervicalgia  Rationale for Evaluation and Treatment: Rehabilitation  ONSET DATE: 01/10/24  SUBJECTIVE:  SUBJECTIVE STATEMENT: The patient is back from traveling. She had a backpack and used it on both sides. With travelling, she notes pillows lead to increased pain. She modified her cross body to be a belt pack and tolerated greater walking. R side is tender to the touch today.   EVAL: Patient reports history of chronic  R neck and upper trap pain which have been present for the past 5 + years. She had some PT in the past with some improvement but continued to have pain and tightness. Symptoms have increased in the past 3-4 months with no known injury or accident. She describes symptoms as pain and tension in the R trap.  Hand dominance: Right  PERTINENT HISTORY:  MI 2023; valvular disease; takotsubo cardiomyopathy; sleep apnea; AODM; arthritis; anxiety; depression; herniorrhaphy; carpal tunnel surgery L 2024; carpal tunnel R; h/o barriatric surgery  PAIN:  Are you having pain? Yes: NPRS scale: 2/10 today Pain location: R cervical and upper trap area  Pain description: tight; aching; shocking  Aggravating factors: only hurts to the touch Relieving factors: meds; sleep  PRECAUTIONS: None  WEIGHT BEARING RESTRICTIONS: No  FALLS:  Has patient fallen in last 6 months? 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  DIAGNOSTIC FINDINGS:  02/12/24: cervical xray - Normal alignment. Moderate C6-C7 disc space narrowing and anterior spurring, mild C5-C6 anterior spurring and disc space narrowing. No evidence of fracture, focal bone abnormality or bony neural foraminal stenosis. No prevertebral soft tissue thickening.   IMPRESSION: Degenerative disc disease at C5-C6 and C6-C7.  PATIENT SURVEYS:  NDI 18/50; 36%  COGNITION: Overall cognitive status: Within functional limits for tasks assessed  SENSATION: Numbness bilat little fingers at night; sometimes numbness in the LE's   POSTURE: rounded shoulders, forward head, increased thoracic kyphosis, and UE in IR at side; scapulae abducted and rotated along the thoracic spine   PALPATION: Significant muscular tightness in R > L pecs; upper trap; leveator; ant/lat/posterior cervical musculature   CERVICAL ROM:  Active ROM A/PROM (deg) eval AROM 05/12/24  Flexion 50   Extension 50   Right lateral flexion 25 30  Left lateral flexion 25 30  Right rotation 62 tight  68 deg Pain at end range  Left rotation 60 pain R 72 deg   (Blank rows = not tested)  UPPER EXTREMITY ROM: WFL's some tightness at end range elevation bilat   UPPER EXTREMITY MMT: MMT Right eval Left eval  Shoulder flexion    Shoulder extension    Shoulder abduction    Shoulder adduction    Shoulder extension    Shoulder internal rotation    Shoulder external rotation    Middle trapezius 4+ 5  Lower trapezius 4 5  Elbow flexion    Elbow extension    Wrist flexion    Wrist extension     Wrist ulnar deviation    Wrist radial deviation    Wrist pronation    Wrist supination    Grip strength     (Blank rows = not tested)  CERVICAL SPECIAL  TESTS:  Upper limb tension test (ULTT): Positive, Spurling's test: Negative, and Distraction test: Negative   [facility] Adult PT Treatment:                                                DATE: 06/07/24 Therapeutic Exercise: Prone on elbows Working on postural lengthening chin tuck + rotation thoracic rotation R and L x 8 reps Standing Shoulder extension blue band with ne R 3# with shoulder shrug and then stretch away into upper trap stretch--- this irritates pain today Sidelying Shoulder ER 2# x 12 reps x 2 sets Manual Therapy: First rib mobilization using a belt and then leaning to L side Scapular mobilization R side in L sidelying STM parascapular mobility and upper trap release K-tape over R upper trap/levator (2 pieces intersect at upper trap trigger point) Neuromuscular re-ed: Quadriped Shoulder scaption R and L x 10 reps with postural cues Birddog x 5 reps R and L for postural stability   [facility] Adult PT Treatment:                                                DATE: 05/28/24 Therapeutic Exercise: Prone Chin tuck for postural alignment On elbows with R and L rotation adding head nods for end range motion Sidelying Head lateral tilt lifting off pillow x 5 reps feels like it needs to pop and notes increased pain when laterally flexing to R, can tolerate on L x 5 reps Supine Neck AROM after increased pain from sidelying activities-- rotation feels blocked to the right side Isometrics into sidebending and rotation-- this reduces end range pain R rotation Manual Therapy: STM cervical paraspinals and suboccipitals, upper trap Contract relax used for cervical ROM and stretching Joint mobilizations lateral glides R>L in L sidelying  Neuromuscular re-ed: Prone W arm lift for scapular retraction x 12 reps T with 1# weights x  12 reps with tactile cues Shoulder extension x 12 reps with 1# reaching towards feet   [facility] Adult PT Treatment:                                                DATE: 05/26/2024 Therapeutic Exercise: 3-way pec stretch 3x30 Standing with forearms thoracic rotation Standing thread the needle stretch with foam roller Manual Therapy: Kinesiotape: Double I strips along thoracic paraspinals 30% tension I strip for Rt upper trap 30% tension Neuromuscular re-ed: Standing with back against wall: High bicep curls front and lateral + 1#DB Shaving + 1#DB Prone: W armlift --> scap retract x12 Shoulder abduction isometric hold at 90 degrees + 1#DB 3x10 T arm reach back to extension + 1#DB 2x5   PATIENT EDUCATION:  Education details: DN handout Person educated: Patient Education method: Explanation, Demonstration, Tactile cues, Verbal cues, and Handouts Education comprehension: verbalized understanding, returned demonstration, verbal cues required, tactile cues required, and needs further education  HOME EXERCISE PROGRAM: Access Code: [email or web address] URL: [email or web address] Date: 05/17/2024 Prepared by: Tawni Ferrier  Exercises - Seated Cervical Sidebending Stretch  - 1 x daily - 5 x weekly - 1 sets - 3 reps - 20  seconds hold - Doorway Pec Stretch at 90 Degrees Abduction  - 1 x daily - 5 x weekly - 1 sets - 3 reps - 20 seconds hold - Standing with Forearms Thoracic Rotation  - 1 x daily - 5 x weekly - 1 sets - 10 reps - Prone Shoulder Horizontal Abduction with Thumbs Up  - 1 x daily - 5 x weekly - 2 sets - 12 reps - Quadruped Full Range Thoracic Rotation with Reach  - 1 x daily - 5 x weekly - 1 sets - 5-8 reps  ASSESSMENT: CLINICAL IMPRESSION:  The patient has met 2 LTGs and partially met 1 LTG. She continues with end range R rotation pain and PT did not reassess NDI-- she has traveled more recently and has dec'd therapy visits in the past month.  PT continuing goals x 1 more  month. We discussed planning to work towards a d/c plan with patient using home exercises and self mobilization activities to manage discomfort in neck/upper trap.   Plan to continue to work towards LTGs focusing on postural strengthening.   OBJECTIVE IMPAIRMENTS: decreased activity tolerance, decreased ROM, decreased strength, increased fascial restrictions, increased muscle spasms, impaired UE functional use, improper body mechanics, postural dysfunction, and pain.   GOALS: Goals reviewed with patient? Yes  UPDATED GOALS:  LONG TERM GOALS: Target date: 06/11/24  The patient will be indep with HEP progression. Baseline: has initial HEP Goal status: MET  2.  Reduce pain to 0/10 with end range R cervical rotation.  Baseline:  Notes a focal area of pain with right rotation Goal status: NOT MET  3. Patient will report reduced frequency of waking with bilateral 5th digit numbness. Baseline:  Notes frequently waking with numbness. Goal status: MET-- no  longer every day, it is about 3 days/week  4.  Patient will be able to walk x 15 minutes without neck heaviness or hand numbness. Baseline:  Notes extended gait provokes a heavy pulling down sensation in neck/shoulders. Goal status: PARTIALLY MET-- could walk around [place] this past weekend.  5. Improve NDI by 8%. .  Baseline: 18/50; 36% 05/10/24: 24% Goal status: UPDATED   UPDATED LONG TERM GOALS:  LONG TERM GOALS: Target date: 07/12/24  The patient will be indep with HEP progression. Baseline: has initial HEP Goal status: REVISED  2.  Reduce pain to 0/10 with end range R cervical rotation.  Baseline:  Notes a focal area of pain with right rotation Goal status: REVISED  3  Patient will be able to walk x 15 minutes without neck heaviness or hand numbness. Baseline:  Notes extended gait provokes a heavy pulling down sensation in neck/shoulders. Goal status: REVISED  4. Improve NDI by 8%. .  Baseline: 18/50; 36%  05/10/24: 24% Goal status: REVISED  PLAN:  PT FREQUENCY: 2x/week  PT DURATION: 6 weeks  PLANNED INTERVENTIONS: 97110-Therapeutic exercises, 97530- Therapeutic activity, 97112- Neuromuscular re-education, 97535- Self Care, [id number]- Manual therapy, Patient/Family education, Taping, Dry Needling, and Joint mobilization  PLAN FOR NEXT SESSION: Work on prone thoracic extension, continue postural strengthening, STM as indicated. Continue 2x/week (may use 1 session to add DN as needed). Isolated scapular strengthening. Consider K tape R upper trap and and levator + postural cues.  Sindi Beckworth, PT 06/07/2024, 11:04 AM

## 2024-06-07 NOTE — Patient Instructions (Signed)
   Follow-Up: At [facility], you and your health needs are our priority.  As part of our continuing mission to provide you with exceptional heart care, our providers are all part of one team.  This team includes your primary Cardiologist (physician) and Advanced Practice Providers or APPs (Physician Assistants and Nurse Practitioners) who all work together to provide you with the care you need, when you need it.  Your next appointment:   AS NEEDED

## 2024-06-15 ENCOUNTER — Ambulatory Visit: Payer: 59 | Admitting: Dermatology

## 2024-06-16 ENCOUNTER — Ambulatory Visit: Attending: Neurosurgery

## 2024-06-16 DIAGNOSIS — R29898 Other symptoms and signs involving the musculoskeletal system: Secondary | ICD-10-CM | POA: Insufficient documentation

## 2024-06-16 DIAGNOSIS — M542 Cervicalgia: Secondary | ICD-10-CM | POA: Insufficient documentation

## 2024-06-16 DIAGNOSIS — M6281 Muscle weakness (generalized): Secondary | ICD-10-CM | POA: Diagnosis present

## 2024-06-16 NOTE — Therapy (Signed)
 OUTPATIENT PHYSICAL THERAPY CERVICAL TREATMENT  Patient Name: Alicia Booth MRN: [id number] DOB:Apr 08, 1971, 53 y.o., female Today's Date: 06/16/2024  END OF SESSION:  PT End of Session - 06/16/24 1406     Visit Number 18    Number of Visits 25    Date for PT Re-Evaluation 07/12/24    Authorization Type [employer]et[employer] no copay    Authorization Time Period 60 VISITS PER YEAR    Authorization - Visit Number 18    Authorization - Number of Visits 60    PT Start Time 1405    PT Stop Time 1448    PT Time Calculation (min) 43 min    Activity Tolerance Patient tolerated treatment well    Behavior During Therapy WFL for tasks assessed/performed         Past Medical History:  Diagnosis Date   Diabetes (HCC)    High cholesterol    Hypertension    Stress-induced cardiomyopathy    Past Surgical History:  Procedure Laterality Date   BARIATRIC SURGERY  2014   BREAST BIOPSY Left    lymph node bx benign   HEEL SPUR EXCISION     LEFT HEART CATH AND CORONARY ANGIOGRAPHY N/A 04/11/2022   Procedure: LEFT HEART CATH AND CORONARY ANGIOGRAPHY;  Surgeon: Dann Candyce RAMAN, MD;  Location: [facility];  Service: Cardiovascular;  Laterality: N/A;   MENISCUS REPAIR     tummy tuck     Patient Active Problem List   Diagnosis Date Noted   Other fatigue 07/25/2023   Seborrheic dermatitis of scalp 05/22/2023   Flexural atopic dermatitis 05/22/2023   Chronic diastolic congestive heart failure (HCC) 01/24/2023   Chronic vaginitis 10/24/2022   Hyperlipidemia associated with type 2 diabetes mellitus (HCC) 10/24/2022   Chronic tension-type headache, not intractable 05/23/2022   Trigger finger, left little finger 05/23/2022   Takotsubo cardiomyopathy 04/24/2022   S/P gastric sleeve procedure 04/24/2022   Adjustment disorder with mixed anxiety and depressed mood 04/24/2022   Perimenopausal symptom 04/24/2022   NSTEMI (non-ST elevated myocardial infarction) (HCC) 04/11/2022   Myopia with astigmatism and  presbyopia, bilateral 03/19/2018   Obstructive sleep apnea (adult) (pediatric) 10/13/2012   Diabetes (HCC) 11/16/2009   Hypertension 11/16/2009    PCP: Roselie Bishop Peek, NP REFERRING PROVIDER: Dr Rockey Peru REFERRING DIAG: Cervicalgia  THERAPY DIAG:  Other symptoms and signs involving the musculoskeletal system  Muscle weakness (generalized)  Cervicalgia  Rationale for Evaluation and Treatment: Rehabilitation  ONSET DATE: 01/10/24  SUBJECTIVE:  SUBJECTIVE STATEMENT: Patient reports she hurts all over after traveling for work, states her back hurts the worst. Patient states her knees and back hurt worst than her shoulders today.   EVAL: Patient reports history of chronic  R neck and upper trap pain which have been present for the past 5 + years. She had some PT in the past with some improvement but continued to have pain and tightness. Symptoms have increased in the past 3-4 months with no known injury or accident. She describes symptoms as pain and tension in the R trap.  Hand dominance: Right  PERTINENT HISTORY:  MI 2023; valvular disease; takotsubo cardiomyopathy; sleep apnea; AODM; arthritis; anxiety; depression; herniorrhaphy; carpal tunnel surgery L 2024; carpal tunnel R; h/o barriatric surgery  PAIN:  Are you having pain? Yes: NPRS scale: 2/10 today Pain location: R cervical and upper trap area  Pain description: tight; aching; shocking  Aggravating factors: only hurts to the touch Relieving factors: meds; sleep  PRECAUTIONS: None  WEIGHT BEARING RESTRICTIONS: No  FALLS:  Has patient fallen in last 6 months? 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  DIAGNOSTIC FINDINGS:  02/12/24: cervical xray - Normal alignment. Moderate C6-C7 disc space narrowing and anterior spurring, mild C5-C6 anterior spurring and disc space narrowing. No evidence of fracture, focal bone abnormality or bony neural foraminal stenosis. No prevertebral soft tissue thickening.   IMPRESSION: Degenerative disc disease at C5-C6 and C6-C7.  PATIENT SURVEYS:  NDI 18/50; 36%  COGNITION: Overall cognitive status: Within functional limits for tasks assessed  SENSATION: Numbness bilat little fingers at night; sometimes numbness in the LE's   POSTURE: rounded shoulders, forward head, increased thoracic kyphosis, and UE in IR at side; scapulae abducted and rotated along the thoracic spine   PALPATION: Significant muscular tightness in R > L pecs; upper trap; leveator; ant/lat/posterior cervical musculature   CERVICAL ROM:  Active ROM A/PROM (deg) eval AROM 05/12/24  Flexion 50   Extension 50   Right lateral flexion 25 30  Left lateral flexion 25 30  Right rotation 62 tight  68 deg Pain at end range  Left rotation 60 pain R 72 deg   (Blank rows = not tested)  UPPER EXTREMITY ROM: WFL's some tightness at end range elevation bilat   UPPER EXTREMITY MMT: MMT Right eval Left eval  Shoulder flexion    Shoulder extension    Shoulder abduction    Shoulder adduction    Shoulder extension    Shoulder internal rotation    Shoulder external rotation    Middle trapezius 4+ 5  Lower trapezius 4 5  Elbow flexion    Elbow extension    Wrist flexion    Wrist extension    Wrist ulnar deviation    Wrist radial deviation     Wrist pronation    Wrist supination    Grip strength     (Blank rows = not tested)  CERVICAL SPECIAL TESTS:  Upper limb tension test (ULTT): Positive, Spurling's test: Negative, and Distraction test: Negative   [facility] Adult  PT Treatment:                                                DATE: 06/16/2024 Therapeutic Exercise: Figure 4 glute stretch Standing lateral lunge adductor stretch Frog leg stretch Seated red PB trunk flexion stretch Manual Therapy: Cupping & IASTM/STM lats, UT, cervicothoracic paraspinals    [facility] Adult PT Treatment:                                                DATE: 06/07/24 Therapeutic Exercise: Prone on elbows Working on postural lengthening chin tuck + rotation thoracic rotation R and L x 8 reps Standing Shoulder extension blue band with ne R 3# with shoulder shrug and then stretch away into upper trap stretch--- this irritates pain today Sidelying Shoulder ER 2# x 12 reps x 2 sets Manual Therapy: First rib mobilization using a belt and then leaning to L side Scapular mobilization R side in L sidelying STM parascapular mobility and upper trap release K-tape over R upper trap/levator (2 pieces intersect at upper trap trigger point) Neuromuscular re-ed: Quadriped Shoulder scaption R and L x 10 reps with postural cues Birddog x 5 reps R and L for postural stability   [facility] Adult PT Treatment:                                                DATE: 05/28/24 Therapeutic Exercise: Prone Chin tuck for postural alignment On elbows with R and L rotation adding head nods for end range motion Sidelying Head lateral tilt lifting off pillow x 5 reps feels like it needs to pop and notes increased pain when laterally flexing to R, can tolerate on L x 5 reps Supine Neck AROM after increased pain from sidelying activities-- rotation feels blocked to the right side Isometrics into sidebending and rotation-- this reduces end range pain R rotation Manual  Therapy: STM cervical paraspinals and suboccipitals, upper trap Contract relax used for cervical ROM and stretching Joint mobilizations lateral glides R>L in L sidelying  Neuromuscular re-ed: Prone W arm lift for scapular retraction x 12 reps T with 1# weights x 12 reps with tactile cues Shoulder extension x 12 reps with 1# reaching towards feet   [facility] Adult PT Treatment:                                                DATE: 05/26/2024 Therapeutic Exercise: 3-way pec stretch 3x30 Standing with forearms thoracic rotation Standing thread the needle stretch with foam roller Manual Therapy: Kinesiotape: Double I strips along thoracic paraspinals 30% tension I strip for Rt upper trap 30% tension Neuromuscular re-ed: Standing with back against wall: High bicep curls front and lateral + 1#DB Shaving + 1#DB Prone: W armlift --> scap retract x12 Shoulder abduction isometric hold at 90 degrees + 1#DB 3x10 T arm reach back to extension + 1#DB 2x5   PATIENT EDUCATION:  Education details: DN handout Person educated:  Patient Education method: Explanation, Demonstration, Tactile cues, Verbal cues, and Handouts Education comprehension: verbalized understanding, returned demonstration, verbal cues required, tactile cues required, and needs further education  HOME EXERCISE PROGRAM: Access Code: [email or web address] URL: [email or web address] Date: 05/17/2024 Prepared by: Tawni Ferrier  Exercises - Seated Cervical Sidebending Stretch  - 1 x daily - 5 x weekly - 1 sets - 3 reps - 20 seconds hold - Doorway Pec Stretch at 90 Degrees Abduction  - 1 x daily - 5 x weekly - 1 sets - 3 reps - 20 seconds hold - Standing with Forearms Thoracic Rotation  - 1 x daily - 5 x weekly - 1 sets - 10 reps - Prone Shoulder Horizontal Abduction with Thumbs Up  - 1 x daily - 5 x weekly - 2 sets - 12 reps - Quadruped Full Range Thoracic Rotation with Reach  - 1 x daily - 5 x weekly - 1 sets - 5-8  reps  ASSESSMENT: CLINICAL IMPRESSION:  Manual interventions focused on cupping for myofascial decompression, focusing on latissimus and trapezius muscles. Seated trunk flexion stretch trialed, however increased bilateral low back pain (sharp pain). Gluteal and adductor stretch variations completed with patient reported decrease in low back pain. Will continue with postural strengthening and progressing LTGs at next visit.     OBJECTIVE IMPAIRMENTS: decreased activity tolerance, decreased ROM, decreased strength, increased fascial restrictions, increased muscle spasms, impaired UE functional use, improper body mechanics, postural dysfunction, and pain.   GOALS: Goals reviewed with patient? Yes  UPDATED GOALS:  LONG TERM GOALS: Target date: 06/11/24  The patient will be indep with HEP progression. Baseline: has initial HEP Goal status: MET  2.  Reduce pain to 0/10 with end range R cervical rotation.  Baseline:  Notes a focal area of pain with right rotation Goal status: NOT MET  3. Patient will report reduced frequency of waking with bilateral 5th digit numbness. Baseline:  Notes frequently waking with numbness. Goal status: MET-- no  longer every day, it is about 3 days/week  4.  Patient will be able to walk x 15 minutes without neck heaviness or hand numbness. Baseline:  Notes extended gait provokes a heavy pulling down sensation in neck/shoulders. Goal status: PARTIALLY MET-- could walk around [place] this past weekend.  5. Improve NDI by 8%. .  Baseline: 18/50; 36% 05/10/24: 24% Goal status: UPDATED   UPDATED LONG TERM GOALS:  LONG TERM GOALS: Target date: 07/12/24  The patient will be indep with HEP progression. Baseline: has initial HEP Goal status: REVISED  2.  Reduce pain to 0/10 with end range R cervical rotation.  Baseline:  Notes a focal area of pain with right rotation Goal status: REVISED  3  Patient will be able to walk x 15 minutes without neck  heaviness or hand numbness. Baseline:  Notes extended gait provokes a heavy pulling down sensation in neck/shoulders. Goal status: REVISED  4. Improve NDI by 8%. .  Baseline: 18/50; 36% 05/10/24: 24% Goal status: REVISED  PLAN:  PT FREQUENCY: 2x/week  PT DURATION: 6 weeks  PLANNED INTERVENTIONS: 97110-Therapeutic exercises, 97530- Therapeutic activity, 97112- Neuromuscular re-education, 97535- Self Care, [id number]- Manual therapy, Patient/Family education, Taping, Dry Needling, and Joint mobilization  PLAN FOR NEXT SESSION: Work on prone thoracic extension, continue postural strengthening, STM/cupping as indicated.    Lamarr GORMAN Price, PTA 06/16/2024, 2:58 PM

## 2024-06-18 ENCOUNTER — Encounter: Admitting: Rehabilitative and Restorative Service Providers"

## 2024-06-22 ENCOUNTER — Other Ambulatory Visit: Payer: Self-pay

## 2024-06-22 MED ORDER — SPIRONOLACTONE 25 MG PO TABS: 12.5000 mg | ORAL_TABLET | Freq: Every day | ORAL | 3 refills | Status: AC

## 2024-06-23 ENCOUNTER — Ambulatory Visit

## 2024-06-23 DIAGNOSIS — M6281 Muscle weakness (generalized): Secondary | ICD-10-CM

## 2024-06-23 DIAGNOSIS — R29898 Other symptoms and signs involving the musculoskeletal system: Secondary | ICD-10-CM

## 2024-06-23 DIAGNOSIS — M542 Cervicalgia: Secondary | ICD-10-CM

## 2024-06-23 NOTE — Therapy (Addendum)
 OUTPATIENT PHYSICAL THERAPY CERVICAL TREATMENT AND D/C SUMMARY  Patient Name: Alicia Booth MRN: [id number] DOB:[birth year], 53 y.o., female Today's Date: 06/23/2024   PHYSICAL THERAPY DISCHARGE SUMMARY  Visits from Start of Care: 19  Current functional level related to goals / functional outcomes: Patient goals recently updated prior to d/c and she had partially met new STGs. She did not return for LTG assessment.    Remaining deficits: The patient has improved NDI from 36% to 24%. She continues with end range focal pain in c-spine.   Education / Equipment: HEP, self mgmt of pain   Patient agrees to discharge. Patient goals were partially met. Patient is being discharged due to not returning since the last visit.  END OF SESSION:  PT End of Session - 06/23/24 1538     Visit Number 19    Number of Visits 25    Date for PT Re-Evaluation 07/12/24    Authorization Type aetna no copay    Authorization Time Period 60 VISITS PER YEAR    Authorization - Visit Number 19    Authorization - Number of Visits 60    PT Start Time 1538    PT Stop Time 1618    PT Time Calculation (min) 40 min    Activity Tolerance Patient tolerated treatment well    Behavior During Therapy WFL for tasks assessed/performed         Past Medical History:  Diagnosis Date   Diabetes (HCC)    High cholesterol    Hypertension    Stress-induced cardiomyopathy    Past Surgical History:  Procedure Laterality Date   BARIATRIC SURGERY  2014   BREAST BIOPSY Left    lymph node bx benign   HEEL SPUR EXCISION     LEFT HEART CATH AND CORONARY ANGIOGRAPHY N/A 04/11/2022   Procedure: LEFT HEART CATH AND CORONARY ANGIOGRAPHY;  Surgeon: Dann Candyce RAMAN, MD;  Location: [facility];  Service: Cardiovascular;  Laterality: N/A;   MENISCUS REPAIR     tummy tuck     Patient Active Problem List   Diagnosis Date Noted   Other fatigue 07/25/2023   Seborrheic dermatitis of scalp 05/22/2023   Flexural atopic  dermatitis 05/22/2023   Chronic diastolic congestive heart failure (HCC) 01/24/2023   Chronic vaginitis 10/24/2022   Hyperlipidemia associated with type 2 diabetes mellitus (HCC) 10/24/2022   Chronic tension-type headache, not intractable 05/23/2022   Trigger finger, left little finger 05/23/2022   Takotsubo cardiomyopathy 04/24/2022   S/P gastric sleeve procedure 04/24/2022   Adjustment disorder with mixed anxiety and depressed mood 04/24/2022   Perimenopausal symptom 04/24/2022   NSTEMI (non-ST elevated myocardial infarction) (HCC) 04/11/2022   Myopia with astigmatism and presbyopia, bilateral 03/19/2018   Obstructive sleep apnea (adult) (pediatric) 10/13/2012   Diabetes (HCC) 11/16/2009   Hypertension 11/16/2009    PCP: Roselie Bishop Peek, NP REFERRING PROVIDER: Dr Rockey Peru REFERRING DIAG: Cervicalgia  THERAPY DIAG:  Other symptoms and signs involving the musculoskeletal system  Muscle weakness (generalized)  Cervicalgia  Rationale for Evaluation and Treatment: Rehabilitation  ONSET DATE: 01/10/24  SUBJECTIVE:  SUBJECTIVE STATEMENT: Patient reports her low back is feeling better but her shoulders are tight and sore from work stress.  EVAL: Patient reports history of chronic  R neck and upper trap pain which have been present for the past 5 + years. She had some PT in the past with some improvement but continued to have pain and tightness. Symptoms have increased in the past 3-4 months with no known injury or accident. She describes symptoms as pain and tension in the R trap.  Hand dominance: Right  PERTINENT HISTORY:  MI 2023; valvular disease; takotsubo cardiomyopathy; sleep apnea; AODM; arthritis; anxiety; depression; herniorrhaphy; carpal tunnel surgery L 2024; carpal  tunnel R; h/o barriatric surgery  PAIN:  Are you having pain? Yes: NPRS scale: 2/10 today Pain location: R cervical and upper trap area  Pain description: tight; aching; shocking  Aggravating factors: only hurts to the touch Relieving factors: meds; sleep  PRECAUTIONS: None  WEIGHT BEARING RESTRICTIONS: No  FALLS:  Has patient fallen in last 6 months? 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  DIAGNOSTIC FINDINGS:  02/12/24: cervical xray - Normal alignment. Moderate C6-C7 disc space narrowing and anterior spurring, mild C5-C6 anterior spurring and disc space narrowing. No evidence of fracture, focal bone abnormality or bony neural foraminal stenosis. No prevertebral soft tissue thickening.   IMPRESSION: Degenerative disc disease at C5-C6 and C6-C7.  PATIENT SURVEYS:  NDI 18/50; 36%  COGNITION: Overall cognitive status: Within functional limits for tasks assessed  SENSATION: Numbness bilat little fingers at night; sometimes numbness in the LE's   POSTURE: rounded shoulders, forward head, increased thoracic kyphosis, and UE in IR at side; scapulae abducted and rotated along the thoracic spine   PALPATION: Significant muscular tightness in R > L pecs; upper trap; leveator; ant/lat/posterior cervical musculature   CERVICAL ROM:  Active ROM A/PROM (deg) eval AROM 05/12/24  Flexion 50   Extension 50   Right lateral flexion 25 30  Left lateral flexion 25 30  Right rotation 62 tight  68 deg Pain at end range  Left rotation 60  pain R 72 deg   (Blank rows = not tested)  UPPER EXTREMITY ROM: WFL's some tightness at end range elevation bilat   UPPER EXTREMITY MMT: MMT Right eval Left eval  Shoulder flexion    Shoulder extension    Shoulder abduction    Shoulder adduction    Shoulder extension    Shoulder internal rotation    Shoulder external rotation    Middle trapezius 4+ 5  Lower trapezius 4 5  Elbow flexion    Elbow extension    Wrist flexion    Wrist extension    Wrist ulnar deviation    Wrist radial deviation    Wrist pronation    Wrist supination    Grip strength     (Blank rows = not tested)  CERVICAL SPECIAL TESTS:  Upper limb tension test (ULTT): Positive, Spurling's test: Negative, and Distraction test: Negative   [facility] Adult PT Treatment:  DATE: 06/23/2024 Therapeutic Exercise: Seated UT stretch variations (Rt) Thoracic extension & upper trap stretch --> prayer arms with elbows propped on mat table (kneeling) Manual Therapy: Cupping for myofascial decompression Rt upper traps  IASTM Rt upper traps & suboccipitals   Neuromuscular re-ed: Scapula clock + YTB SA wall slides with foam roller SA activation wall slides  Lower trap lift off    [facility] Adult PT Treatment:                                                DATE: 06/16/2024 Therapeutic Exercise: Figure 4 glute stretch Standing lateral lunge adductor stretch Frog leg stretch Seated red PB trunk flexion stretch Manual Therapy: Cupping & IASTM/STM lats, UT, cervicothoracic paraspinals    [facility] Adult PT Treatment:                                                DATE: 06/07/24 Therapeutic Exercise: Prone on elbows Working on postural lengthening chin tuck + rotation thoracic rotation R and L x 8 reps Standing Shoulder extension blue band with ne R 3# with shoulder shrug and then stretch away into upper trap stretch--- this irritates pain today Sidelying Shoulder ER 2# x 12 reps x  2 sets Manual Therapy: First rib mobilization using a belt and then leaning to L side Scapular mobilization R side in L sidelying STM parascapular mobility and upper trap release K-tape over R upper trap/levator (2 pieces intersect at upper trap trigger point) Neuromuscular re-ed: Quadriped Shoulder scaption R and L x 10 reps with postural cues Birddog x 5 reps R and L for postural stability   [facility] Adult PT Treatment:                                                DATE: 05/28/24 Therapeutic Exercise: Prone Chin tuck for postural alignment On elbows with R and L rotation adding head nods for end range motion Sidelying Head lateral tilt lifting off pillow x 5 reps feels like it needs to pop and notes increased pain when laterally flexing to R, can tolerate on L x 5 reps Supine Neck AROM after increased pain from sidelying activities-- rotation feels blocked to the right side Isometrics into sidebending and rotation-- this reduces end range pain R rotation Manual Therapy: STM cervical paraspinals and suboccipitals, upper trap Contract relax used for cervical ROM and stretching Joint mobilizations lateral glides R>L in L sidelying  Neuromuscular re-ed: Prone W arm lift for scapular retraction x 12 reps T with 1# weights x 12 reps with tactile cues Shoulder extension x 12 reps with 1# reaching towards feet   PATIENT EDUCATION:  Education details: DN handout Person educated: Patient Education method: Programmer, Multimedia, Demonstration, Actor cues, Verbal cues, and Handouts Education comprehension: verbalized understanding, returned demonstration, verbal cues required, tactile cues required, and needs further education  HOME EXERCISE PROGRAM: Access Code: [email or web address] URL: [email or web address] Date: 05/17/2024 Prepared by: Tawni Ferrier  Exercises - Seated Cervical Sidebending Stretch  - 1 x daily - 5 x weekly - 1 sets - 3 reps - 20 seconds hold -  Doorway Pec Stretch  at 90 Degrees Abduction  - 1 x daily - 5 x weekly - 1 sets - 3 reps - 20 seconds hold - Standing with Forearms Thoracic Rotation  - 1 x daily - 5 x weekly - 1 sets - 10 reps - Prone Shoulder Horizontal Abduction with Thumbs Up  - 1 x daily - 5 x weekly - 2 sets - 12 reps - Quadruped Full Range Thoracic Rotation with Reach  - 1 x daily - 5 x weekly - 1 sets - 5-8 reps  ASSESSMENT: CLINICAL IMPRESSION:  Myofascial decompression continued to address tightness in cervical musculature (Rt>Lt). Cueing provided to improve postural awareness with elevated shoulders with overhead arm raises. Stretch variations provided for upper trap and thoracic extension mobility. Plan to focus on progressing postural strengthening exercises and progressing HEP at next visit.     OBJECTIVE IMPAIRMENTS: decreased activity tolerance, decreased ROM, decreased strength, increased fascial restrictions, increased muscle spasms, impaired UE functional use, improper body mechanics, postural dysfunction, and pain.   GOALS: Goals reviewed with patient? Yes  UPDATED GOALS:  LONG TERM GOALS: Target date: 06/11/24  The patient will be indep with HEP progression. Baseline: has initial HEP Goal status: MET  2.  Reduce pain to 0/10 with end range R cervical rotation.  Baseline:  Notes a focal area of pain with right rotation Goal status: NOT MET  3. Patient will report reduced frequency of waking with bilateral 5th digit numbness. Baseline:  Notes frequently waking with numbness. Goal status: MET-- no  longer every day, it is about 3 days/week  4.  Patient will be able to walk x 15 minutes without neck heaviness or hand numbness. Baseline:  Notes extended gait provokes a heavy pulling down sensation in neck/shoulders. Goal status: PARTIALLY MET-- could [place] this past weekend.  5. Improve NDI by 8%. .  Baseline: 18/50; 36% 05/10/24: 24% Goal status: UPDATED   UPDATED LONG TERM GOALS:  LONG  TERM GOALS: Target date: 07/12/24  The patient will be indep with HEP progression. Baseline: has initial HEP Goal status: REVISED  2.  Reduce pain to 0/10 with end range R cervical rotation.  Baseline:  Notes a focal area of pain with right rotation Goal status: REVISED  3  Patient will be able to walk x 15 minutes without neck heaviness or hand numbness. Baseline:  Notes extended gait provokes a heavy pulling down sensation in neck/shoulders. Goal status: REVISED  4. Improve NDI by 8%. .  Baseline: 18/50; 36% 05/10/24: 24% Goal status: REVISED  PLAN:  PT FREQUENCY: 2x/week  PT DURATION: 6 weeks  PLANNED INTERVENTIONS: 97110-Therapeutic exercises, 97530- Therapeutic activity, 97112- Neuromuscular re-education, 97535- Self Care, [id number]- Manual therapy, Patient/Family education, Taping, Dry Needling, and Joint mobilization  PLAN FOR NEXT SESSION: Next visit: NDI, revised LTGs. Work on prone thoracic extension, continue postural strengthening, STM/cupping as indicated.    Lamarr GORMAN Price, PTA 06/23/2024, 4:21 PM

## 2024-06-25 ENCOUNTER — Encounter

## 2024-06-30 ENCOUNTER — Ambulatory Visit: Payer: Self-pay

## 2024-07-07 ENCOUNTER — Ambulatory Visit: Payer: Self-pay | Admitting: Physical Therapy

## 2024-07-12 ENCOUNTER — Encounter: Payer: Self-pay | Admitting: Rehabilitative and Restorative Service Providers"

## 2024-07-12 ENCOUNTER — Ambulatory Visit
Admission: EM | Admit: 2024-07-12 | Discharge: 2024-07-12 | Disposition: A | Discharge status: Home / Self Care | Attending: Family Medicine | Admitting: Family Medicine

## 2024-07-12 ENCOUNTER — Encounter: Payer: Self-pay | Admitting: Nurse Practitioner

## 2024-07-12 ENCOUNTER — Ambulatory Visit: Payer: Self-pay | Admitting: Rehabilitative and Restorative Service Providers"

## 2024-07-12 DIAGNOSIS — W57XXXA Bitten or stung by nonvenomous insect and other nonvenomous arthropods, initial encounter: Secondary | ICD-10-CM | POA: Diagnosis not present

## 2024-07-12 DIAGNOSIS — R21 Rash and other nonspecific skin eruption: Secondary | ICD-10-CM | POA: Diagnosis not present

## 2024-07-12 MED ORDER — DOXYCYCLINE HYCLATE 100 MG PO CAPS
100.0000 mg | ORAL_CAPSULE | Freq: Two times a day (BID) | ORAL | 0 refills | Status: AC
Start: 2024-07-12 — End: 2024-07-19

## 2024-07-12 NOTE — Therapy (Deleted)
 OUTPATIENT PHYSICAL THERAPY CERVICAL TREATMENT  Patient Name: Alicia Booth MRN: [id number] DOB:Oct 04, 1971, 53 y.o., female Today's Date: 07/12/2024  END OF SESSION:   Past Medical History:  Diagnosis Date   Diabetes (HCC)    High cholesterol    Hypertension    Stress-induced cardiomyopathy    Past Surgical History:  Procedure Laterality Date   BARIATRIC SURGERY  2014   BREAST BIOPSY Left    lymph node bx benign   HEEL SPUR EXCISION     LEFT HEART CATH AND CORONARY ANGIOGRAPHY N/A 04/11/2022   Procedure: LEFT HEART CATH AND CORONARY ANGIOGRAPHY;  Surgeon: Dann Candyce RAMAN, MD;  Location: [facility];  Service: Cardiovascular;  Laterality: N/A;   MENISCUS REPAIR     tummy tuck     Patient Active Problem List   Diagnosis Date Noted   Other fatigue 07/25/2023   Seborrheic dermatitis of scalp 05/22/2023   Flexural atopic dermatitis 05/22/2023   Chronic diastolic congestive heart failure (HCC) 01/24/2023   Chronic vaginitis 10/24/2022   Hyperlipidemia associated with type 2 diabetes mellitus (HCC) 10/24/2022   Chronic tension-type headache, not intractable 05/23/2022   Trigger finger, left little finger 05/23/2022   Takotsubo cardiomyopathy 04/24/2022   S/P gastric sleeve procedure 04/24/2022   Adjustment disorder with mixed anxiety and depressed mood 04/24/2022   Perimenopausal symptom 04/24/2022   NSTEMI (non-ST elevated myocardial infarction) (HCC) 04/11/2022   Myopia with astigmatism and presbyopia, bilateral 03/19/2018   Obstructive sleep apnea (adult) (pediatric) 10/13/2012   Diabetes (HCC) 11/16/2009   Hypertension 11/16/2009    PCP: Roselie Bishop Peek, NP REFERRING PROVIDER: Dr Rockey Peru REFERRING DIAG: Cervicalgia  THERAPY DIAG:  No diagnosis found.  Rationale for Evaluation and Treatment: Rehabilitation  ONSET DATE: 01/10/24  SUBJECTIVE:                                                                                                                                                                                                          SUBJECTIVE STATEMENT: Patient reports her low back is feeling better but her shoulders are tight and sore from work stress.  EVAL: Patient reports history of chronic  R neck and upper trap pain which have been present for the past 5 + years. She had some PT in the past with some improvement but continued to have pain and tightness. Symptoms have increased in the past 3-4 months with no known injury or accident. She describes symptoms as pain and tension in the R trap.  Hand dominance: Right  PERTINENT HISTORY:  MI 2023;  valvular disease; takotsubo cardiomyopathy; sleep apnea; AODM; arthritis; anxiety; depression; herniorrhaphy; carpal tunnel surgery L 2024; carpal tunnel R; h/o barriatric surgery  PAIN:  Are you having pain? Yes: NPRS scale: 2/10 today Pain location: R cervical and upper trap area  Pain description: tight; aching; shocking  Aggravating factors: only hurts to the touch Relieving factors: meds; sleep  PRECAUTIONS: None  WEIGHT BEARING RESTRICTIONS: No  FALLS:  Has patient fallen in last 6 months? 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  DIAGNOSTIC FINDINGS:  02/12/24: cervical xray - Normal alignment. Moderate C6-C7 disc space narrowing and anterior spurring, mild C5-C6 anterior spurring and disc space narrowing. No evidence of fracture, focal bone abnormality or bony neural foraminal stenosis. No prevertebral soft tissue  thickening.   IMPRESSION: Degenerative disc disease at C5-C6 and C6-C7.  PATIENT SURVEYS:  NDI 18/50; 36%  COGNITION: Overall cognitive status: Within functional limits for tasks assessed  SENSATION: Numbness bilat little fingers at night; sometimes numbness in the LE's   POSTURE: rounded shoulders, forward head, increased thoracic kyphosis, and UE in IR at side; scapulae abducted and rotated along the thoracic spine   PALPATION: Significant muscular tightness in R > L pecs; upper trap; leveator; ant/lat/posterior cervical musculature   CERVICAL ROM:  Active ROM A/PROM (deg) eval AROM 05/12/24  Flexion 50   Extension 50   Right lateral flexion 25 30  Left lateral flexion 25 30  Right rotation 62 tight  68 deg Pain at end range  Left rotation 60 pain R 72 deg   (Blank rows = not tested)  UPPER EXTREMITY ROM: WFL's some tightness at end range elevation bilat   UPPER EXTREMITY MMT: MMT Right eval Left eval  Shoulder flexion    Shoulder extension    Shoulder abduction    Shoulder adduction    Shoulder extension    Shoulder internal rotation    Shoulder external rotation    Middle trapezius 4+ 5  Lower trapezius 4 5  Elbow flexion    Elbow extension    Wrist flexion    Wrist extension    Wrist ulnar deviation    Wrist radial deviation    Wrist pronation    Wrist supination    Grip strength     (Blank rows = not tested)  CERVICAL SPECIAL TESTS:  Upper limb tension test (ULTT): Positive, Spurling's test: Negative, and Distraction test: Negative   [facility] Adult PT Treatment:                                                DATE: 06/23/2024 Therapeutic Exercise: Seated UT stretch variations (Rt) Thoracic extension & upper trap stretch --> prayer arms with elbows propped on mat table (kneeling) Manual Therapy: Cupping for myofascial decompression Rt upper traps  IASTM Rt upper traps & suboccipitals   Neuromuscular re-ed: Scapula clock + YTB SA wall slides with  foam roller SA activation wall  slides  Lower trap lift off    [facility] Adult PT Treatment:                                                DATE: 06/16/2024 Therapeutic Exercise: Figure 4 glute stretch Standing lateral lunge adductor stretch Frog leg stretch Seated red PB trunk flexion stretch Manual Therapy: Cupping & IASTM/STM lats, UT, cervicothoracic paraspinals    [facility] Adult PT Treatment:                                                DATE: 06/07/24 Therapeutic Exercise: Prone on elbows Working on postural lengthening chin tuck + rotation thoracic rotation R and L x 8 reps Standing Shoulder extension blue band with ne R 3# with shoulder shrug and then stretch away into upper trap stretch--- this irritates pain today Sidelying Shoulder ER 2# x 12 reps x 2 sets Manual Therapy: First rib mobilization using a belt and then leaning to L side Scapular mobilization R side in L sidelying STM parascapular mobility and upper trap release K-tape over R upper trap/levator (2 pieces intersect at upper trap trigger point) Neuromuscular re-ed: Quadriped Shoulder scaption R and L x 10 reps with postural cues Birddog x 5 reps R and L for postural stability   [facility] Adult PT Treatment:                                                DATE: 05/28/24 Therapeutic Exercise: Prone Chin tuck for postural alignment On elbows with R and L rotation adding head nods for end range motion Sidelying Head lateral tilt lifting off pillow x 5 reps feels like it needs to pop and notes increased pain when laterally flexing to R, can tolerate on L x 5 reps Supine Neck AROM after increased pain from sidelying activities-- rotation feels blocked to the right side Isometrics into sidebending and rotation-- this reduces end range pain R rotation Manual Therapy: STM cervical paraspinals and suboccipitals, upper trap Contract relax used for cervical ROM and stretching Joint mobilizations lateral glides R>L in L  sidelying  Neuromuscular re-ed: Prone W arm lift for scapular retraction x 12 reps T with 1# weights x 12 reps with tactile cues Shoulder extension x 12 reps with 1# reaching towards feet   PATIENT EDUCATION:  Education details: DN handout Person educated: Patient Education method: Programmer, multimedia, Demonstration, Actor cues, Verbal cues, and Handouts Education comprehension: verbalized understanding, returned demonstration, verbal cues required, tactile cues required, and needs further education  HOME EXERCISE PROGRAM: Access Code: [email or web address] URL: [email or web address] Date: 05/17/2024 Prepared by: Tawni Ferrier  Exercises - Seated Cervical Sidebending Stretch  - 1 x daily - 5 x weekly - 1 sets - 3 reps - 20 seconds hold - Doorway Pec Stretch at 90 Degrees Abduction  - 1 x daily - 5 x weekly - 1 sets - 3 reps - 20 seconds hold - Standing with Forearms Thoracic Rotation  - 1 x daily - 5 x weekly - 1 sets - 10 reps - Prone Shoulder Horizontal Abduction with Thumbs Up  -  1 x daily - 5 x weekly - 2 sets - 12 reps - Quadruped Full Range Thoracic Rotation with Reach  - 1 x daily - 5 x weekly - 1 sets - 5-8 reps  ASSESSMENT: CLINICAL IMPRESSION:  Myofascial decompression continued to address tightness in cervical musculature (Rt>Lt). Cueing provided to improve postural awareness with elevated shoulders with overhead arm raises. Stretch variations provided for upper trap and thoracic extension mobility. Plan to focus on progressing postural strengthening exercises and progressing HEP at next visit.      OBJECTIVE IMPAIRMENTS: decreased activity tolerance, decreased ROM, decreased strength, increased fascial restrictions, increased muscle spasms, impaired UE functional use, improper body mechanics, postural dysfunction, and pain.   GOALS: Goals reviewed with patient? Yes  UPDATED GOALS:  LONG TERM GOALS: Target date: 06/11/24  The patient will be indep with HEP  progression. Baseline: has initial HEP Goal status: MET  2.  Reduce pain to 0/10 with end range R cervical rotation.  Baseline:  Notes a focal area of pain with right rotation Goal status: NOT MET  3. Patient will report reduced frequency of waking with bilateral 5th digit numbness. Baseline:  Notes frequently waking with numbness. Goal status: MET-- no  longer every day, it is about 3 days/week  4.  Patient will be able to walk x 15 minutes without neck heaviness or hand numbness. Baseline:  Notes extended gait provokes a heavy pulling down sensation in neck/shoulders. Goal status: PARTIALLY MET-- could [place] this past weekend.  5. Improve NDI by 8%. .  Baseline: 18/50; 36% 05/10/24: 24% Goal status: UPDATED   UPDATED LONG TERM GOALS:  LONG TERM GOALS: Target date: 07/12/24  The patient will be indep with HEP progression. Baseline: has initial HEP Goal status: REVISED  2.  Reduce pain to 0/10 with end range R cervical rotation.  Baseline:  Notes a focal area of pain with right rotation Goal status: REVISED  3  Patient will be able to walk x 15 minutes without neck heaviness or hand numbness. Baseline:  Notes extended gait provokes a heavy pulling down sensation in neck/shoulders. Goal status: REVISED  4. Improve NDI by 8%. .  Baseline: 18/50; 36% 05/10/24: 24% Goal status: REVISED  PLAN:  PT FREQUENCY: 2x/week  PT DURATION: 6 weeks  PLANNED INTERVENTIONS: 97110-Therapeutic exercises, 97530- Therapeutic activity, 97112- Neuromuscular re-education, 97535- Self Care, [id number]- Manual therapy, Patient/Family education, Taping, Dry Needling, and Joint mobilization  PLAN FOR NEXT SESSION: Next visit: NDI, revised LTGs. Work on prone thoracic extension, continue postural strengthening, STM/cupping as indicated.    Alicia Booth, PT 07/12/2024, 8:53 AM

## 2024-07-12 NOTE — ED Provider Notes (Signed)
 Alicia Booth C[facility]    CSN: [id number] Arrival date & time: 07/12/24  1216      History   Chief Complaint Chief Complaint  Patient presents with   Rash    Left ankle     HPI Alicia Booth is a 53 y.o. female.   HPI  Past Medical History:  Diagnosis Date   Diabetes (HCC)    High cholesterol    Hypertension    Stress-induced cardiomyopathy     Patient Active Problem List   Diagnosis Date Noted   Other fatigue 07/25/2023   Seborrheic dermatitis of scalp 05/22/2023   Flexural atopic dermatitis 05/22/2023   Chronic diastolic congestive heart failure (HCC) 01/24/2023   Chronic vaginitis 10/24/2022   Hyperlipidemia associated with type 2 diabetes mellitus (HCC) 10/24/2022   Chronic tension-type headache, not intractable 05/23/2022   Trigger finger, left little finger 05/23/2022   Takotsubo cardiomyopathy 04/24/2022   S/P gastric sleeve procedure 04/24/2022   Adjustment disorder with mixed anxiety and depressed mood 04/24/2022   Perimenopausal symptom 04/24/2022   NSTEMI (non-ST elevated myocardial infarction) (HCC) 04/11/2022   Myopia with astigmatism and presbyopia, bilateral 03/19/2018   Obstructive sleep apnea (adult) (pediatric) 10/13/2012   Diabetes (HCC) 11/16/2009   Hypertension 11/16/2009    Past Surgical History:  Procedure Laterality Date   BARIATRIC SURGERY  2014   BREAST BIOPSY Left    lymph node bx benign   HEEL SPUR EXCISION     LEFT HEART CATH AND CORONARY ANGIOGRAPHY N/A 04/11/2022   Procedure: LEFT HEART CATH AND CORONARY ANGIOGRAPHY;  Surgeon: Dann Candyce RAMAN, MD;  Location: [facility];  Service: Cardiovascular;  Laterality: N/A;   MENISCUS REPAIR     tummy tuck      OB History     Gravida  2   Para      Term      Preterm      AB  2   Living         SAB  2   IAB      Ectopic      Multiple      Live Births               Home Medications    Prior to Admission medications   Medication Sig Start  Date End Date Taking? Authorizing Provider  doxycycline  (VIBRAMYCIN ) 100 MG capsule Take 1 capsule (100 mg total) by mouth 2 (two) times daily for 7 days. 07/12/24 07/19/24 Yes Teddy Sharper, FNP  Fezolinetant  (VEOZAH ) 45 MG TABS Take 1 tablet (45 mg total) by mouth daily. 02/11/24   Nche, Roselie Rockford, NP  fluticasone  (FLONASE ) 50 MCG/ACT nasal spray Place into both nostrils as needed for allergies or rhinitis.    [provider]  ketoconazole (NIZORAL) 2 % shampoo Apply 1 Application topically 2 (two) times a week. 05/22/23   Nche, Roselie Rockford, NP  lisinopril  (ZESTRIL ) 40 MG tablet Take 1 tablet (40 mg total) by mouth at bedtime. 02/11/24   Nche, Roselie Rockford, NP  metoprolol  succinate (TOPROL -XL) 100 MG 24 hr tablet TAKE ONE TABLET BY MOUTH ONE TIME DAILY. TAKE WITH OR IMMEDIATELY FOLLOWING A MEAL 05/25/24   Pietro Redell RAMAN, MD  Multiple Vitamins-Minerals (ONE-A-DAY 50 PLUS PO) Take 1 tablet by mouth at bedtime.    [provider]  naproxen  (NAPROSYN ) 500 MG tablet Bid prn pain 02/10/24   Jule Ronal CROME, PA-C  nitroGLYCERIN  (NITROSTAT ) 0.4 MG SL tablet Place 1 tablet (0.4  mg total) under the tongue every 5 (five) minutes x 3 doses as needed for chest pain. 04/12/22   Vicci Rollo SAUNDERS, PA-C  rosuvastatin  (CRESTOR ) 40 MG tablet Take 1 tablet (40 mg total) by mouth daily. 02/11/24   Nche, Roselie Rockford, NP  Semaglutide , 1 MG/DOSE, (OZEMPIC , 1 MG/DOSE,) 4 MG/3ML SOPN Inject 1 mg into the skin once a week. 02/11/24   Nche, Roselie Rockford, NP  spironolactone  (ALDACTONE ) 25 MG tablet Take 0.5 tablets (12.5 mg total) by mouth daily. 06/22/24   Pietro Redell RAMAN, MD  triamcinolone  ointment (KENALOG ) 0.1 % Apply 1 Application topically 2 (two) times daily. 09/11/23   Nche, Roselie Rockford, NP  venlafaxine  XR (EFFEXOR -XR) 37.5 MG 24 hr capsule Take 1 capsule (37.5 mg total) by mouth daily with breakfast. 02/11/24   Nche, Roselie Rockford, NP    Family History Family History  Problem Relation Age of  Onset   Breast cancer Mother 32   Rashes / Skin problems Father    Parkinson's disease Father    Heart attack Maternal Grandmother 66   Alzheimer's disease Maternal Grandfather     Social History Social History   Tobacco Use   Smoking status: Never   Smokeless tobacco: Never  Vaping Use   Vaping status: Never Used  Substance Use Topics   Alcohol use: Yes    Comment: occassionally   Drug use: Never     Allergies   Lipitor [atorvastatin]   Review of Systems Review of Systems  Skin:  Positive for rash.     Physical Exam Triage Vital Signs ED Triage Vitals  Encounter Vitals Group     BP      Girls Systolic BP Percentile      Girls Diastolic BP Percentile      Boys Systolic BP Percentile      Boys Diastolic BP Percentile      Pulse      Resp      Temp      Temp src      SpO2      Weight      Height      Head Circumference      Peak Flow      Pain Score      Pain Loc      Pain Education      Exclude from Growth Chart    No data found.  Updated Vital Signs BP 102/71   Pulse 99   Temp 98.9 F (37.2 C)   Resp 19   LMP  (Within Months)   SpO2 98%   Visual Acuity Right Eye Distance:   Left Eye Distance:   Bilateral Distance:    Right Eye Near:   Left Eye Near:    Bilateral Near:     Physical Exam Vitals and nursing note reviewed.  Constitutional:      Appearance: Normal appearance. She is obese. She is not ill-appearing.  HENT:     Head: Normocephalic and atraumatic.     Mouth/Throat:     Mouth: Mucous membranes are moist.     Pharynx: Oropharynx is clear.  Eyes:     Extraocular Movements: Extraocular movements intact.     Conjunctiva/sclera: Conjunctivae normal.     Pupils: Pupils are equal, round, and reactive to light.  Cardiovascular:     Rate and Rhythm: Normal rate and regular rhythm.     Pulses: Normal pulses.     Heart sounds: Normal heart sounds.  Pulmonary:  Effort: Pulmonary effort is normal.     Breath sounds: No  wheezing, rhonchi or rales.  Musculoskeletal:        General: Normal range of motion.  Skin:    General: Skin is warm and dry.     Comments: Left ankle area (anterior aspect): Erythematous area with pustular center presumably ant bite please see image below  Neurological:     General: No focal deficit present.     Mental Status: She is alert and oriented to person, place, and time.  Psychiatric:        Mood and Affect: Mood normal.        Behavior: Behavior normal.      UC Treatments / Results  Labs (all labs ordered are listed, but only abnormal results are displayed) Labs Reviewed - No data to display  EKG   Radiology No results found.  Procedures Procedures (including critical care time)  Medications Ordered in UC Medications - No data to display  Initial Impression / Assessment and Plan / UC Course  I have reviewed the triage vital signs and the nursing notes.  Pertinent labs & imaging results that were available during my care of the patient were reviewed by me and considered in my medical decision making (see chart for details).     MDM: 1.  Rash and nonspecific skin eruption-Rx'd doxycycline  100 mg capsule: Take 1 capsule twice daily x 7 days; 2.  Bug bite with infection, initial encounter-Rx'd doxycycline  100 mg capsule: Take 1 capsule twice daily x 7 days. Advised patient to take medication as directed with food to completion.  Encouraged to increase daily water intake to 64 ounces per day while taking this medication.  Advised if symptoms worsen and/or unresolved please follow-up with your PCP or here for further evaluation.  Patient discharged home, hemodynamically stable. Final Clinical Impressions(s) / UC Diagnoses   Final diagnoses:  Rash and nonspecific skin eruption  Bug bite with infection, initial encounter     Discharge Instructions      Advised patient to take medication as directed with food to completion.  Encouraged to increase daily water  intake to 64 ounces per day while taking this medication.  Advised if symptoms worsen and/or unresolved please follow-up with your PCP or here for further evaluation.     ED Prescriptions     Medication Sig Dispense Auth. Provider   doxycycline  (VIBRAMYCIN ) 100 MG capsule Take 1 capsule (100 mg total) by mouth 2 (two) times daily for 7 days. 14 capsule Kahley Leib, FNP      PDMP not reviewed this encounter.   Teddy Sharper, FNP 07/12/24 1333

## 2024-07-12 NOTE — Discharge Instructions (Addendum)
 Advised patient to take medication as directed with food to completion.  Encouraged to increase daily water intake to 64 ounces per day while taking this medication.  Advised if symptoms worsen and/or unresolved please follow-up with your PCP or here for further evaluation.

## 2024-07-12 NOTE — ED Triage Notes (Signed)
 [facility] to [facility] with [facility] left ankle ant bite yesterday. Pt reports pain, redness and swelling to site. Pt has been using farnesene oil on it.

## 2024-07-14 ENCOUNTER — Encounter: Payer: Self-pay | Admitting: Nurse Practitioner

## 2024-07-14 ENCOUNTER — Ambulatory Visit: Payer: Self-pay

## 2024-07-14 ENCOUNTER — Telehealth: Admitting: Nurse Practitioner

## 2024-07-14 VITALS — Ht 67.0 in | Wt 183.0 lb

## 2024-07-14 DIAGNOSIS — U071 COVID-19: Secondary | ICD-10-CM | POA: Insufficient documentation

## 2024-07-14 HISTORY — DX: COVID-19: U07.1

## 2024-07-14 MED ORDER — NIRMATRELVIR/RITONAVIR (PAXLOVID)TABLET: 3.0000 | ORAL_TABLET | Freq: Two times a day (BID) | ORAL | 0 refills | Status: AC

## 2024-07-14 NOTE — Assessment & Plan Note (Addendum)
 Symptoms started yesterday. With history of heart failure and diabetes, will have her start paxlovid  BID x5 days. Encourage fluids, rest. Can continue nasal lavage with sterile water. Stop rosuvastatin  while taking paxlovid .   Reviewed home care instructions for COVID. Advised self-isolation at home until fever free for 24 hours without tylenol  or ibuprofen and symptoms are starting to feel better. If symptoms, esp, dyspnea develops/worsens, recommend in-person evaluation at either an urgent care or the emergency room.

## 2024-07-14 NOTE — Telephone Encounter (Signed)
 FYI Only or Action Required?: FYI only for provider.  Patient was last seen in primary care on 02/11/2024 by Nche, Roselie Rockford, NP.  Called Nurse Triage reporting Covid Positive.  Symptoms began yesterday.  Interventions attempted: OTC medications: Tylenol .  Symptoms are: headache, tickle in throat, nasal congestion, mild SOB with exertion gradually worsening.  Triage Disposition: See HCP Within 4 Hours (Or PCP Triage)  Patient/caregiver understands and will follow disposition?: Yes                Message from [facility] C sent at 07/14/2024  1:52 PM EDT  Summary: Covid   Patient sent in a message earlier to see if she can get a call back in regards to her covid symptoms. [phone] (M)         Reason for Disposition  MILD difficulty breathing (e.g., minimal/no SOB at rest, SOB with walking, pulse <100)  Answer Assessment - Initial Assessment Questions 1. COVID-19 DIAGNOSIS: How do you know that you have COVID? (e.g., positive lab test or self-test, diagnosed by doctor or NP/PA, symptoms after exposure).     Self test/home test.  2. COVID-19 EXPOSURE: Was there any known exposure to COVID before the symptoms began? CDC Definition of close contact: within 6 feet (2 meters) for a total of 15 minutes or more over a 24-hour period.      Yes.  3. ONSET: When did the COVID-19 symptoms start?      Last night.  4. WORST SYMPTOM: What is your worst symptom? (e.g., cough, fever, shortness of breath, muscle aches)     Nasal congestion.  5. COUGH: Do you have a cough? If Yes, ask: How bad is the cough?       No.  6. FEVER: Do you have a fever? If Yes, ask: What is your temperature, how was it measured, and when did it start?     She states she has not been able to check with her oral thermometer due to it's broken. She is not sure she has fevers due to she is peri menopausal and had a reaction to an ant bite.  7. RESPIRATORY STATUS: Describe your  breathing? (e.g., normal; shortness of breath, wheezing, unable to speak)      She states it feels okay but it feels labored, or breathing harder than typical from going up and down the stairs just now. She states otherwise she has not felt like her breathing has been different. She states at rest she does not feel labored, she thinks it was just from exertion.  8. BETTER-SAME-WORSE: Are you getting better, staying the same or getting worse compared to yesterday?  If getting worse, ask, In what way?     Worse, symptoms started last night so she states she felt fine this time yesterday.  9. OTHER SYMPTOMS: Do you have any other symptoms?  (e.g., chills, fatigue, headache, loss of smell or taste, muscle pain, sore throat)     Headache and tickle in throat. Left ankle ant bite on 07/11/24 with redness and swelling (went to urgent care on 07/12/24 started antibiotic yesterday ). Denies chest pain/pressure.  10. HIGH RISK DISEASE: Do you have any chronic medical problems? (e.g., asthma, heart or lung disease, weak immune system, obesity, etc.)       No.  11. VACCINE: Have you had the COVID-19 vaccine? If Yes, ask: Which one, how many shots, when did you get it?       Yes, she states gets it annually.  Most recent dosage was a year ago.  12. PREGNANCY: Is there any chance you are pregnant? When was your last menstrual period?       N/A.  13. O2 SATURATION MONITOR:  Do you use an oxygen saturation monitor (pulse oximeter) at home? If Yes, ask What is your reading (oxygen level) today? What is your usual oxygen saturation reading? (e.g., 95%)       99% and HR 82.  Protocols used: Coronavirus (COVID-19) Diagnosed or Suspected-A-AH

## 2024-07-14 NOTE — Patient Instructions (Signed)
 It was great to see you!  Start paxlovid  3 capsules twice a day for 5 days  Stop the rosuvastatin  while you are taking the paxlovid    Drink plenty of fluids and get rest  Let's follow-up if your symptoms worsen or don't improve.   Take care,  Tinnie Harada, NP

## 2024-07-14 NOTE — Telephone Encounter (Signed)
 Noted. Patient scheduled for Mychart Video Visit.

## 2024-07-14 NOTE — Progress Notes (Signed)
 [facility]IMARY [facility] [address] [place] [address] Dept: [phone] Dept Fax: [phone]  Virtual Video Visit  I connected with Alicia Booth on 07/14/24 at  4:00 PM EDT by a video enabled telemedicine application and verified that I am speaking with the correct person using two identifiers.  Location patient: Home Location provider: Clinic Persons participating in the virtual visit: Patient; Tinnie Harada, NP; Laymon Gladis Sharps, CMA  I discussed the limitations of evaluation and management by telemedicine and the availability of in person appointments. The patient expressed understanding and agreed to proceed.  Chief Complaint  Patient presents with   Covid Positive    Tested positive this morning-home test, itchy throat, fatigue, sinus drainage    SUBJECTIVE:  HPI: Alicia Booth is a 53 y.o. female who presents with home positive covid-19 test.   She started off yesterday with itching in her throat and nasal congestion. This was associated with a headache. She denies fever, cough, and ear pain. She states that she was exposed, however did not know originally that the person was sick. She had a positive covid-19 test this morning. She took tylenol  this morning.   Patient Active Problem List   Diagnosis Date Noted   COVID-19 07/14/2024   Other fatigue 07/25/2023   Seborrheic dermatitis of scalp 05/22/2023   Flexural atopic dermatitis 05/22/2023   Chronic diastolic congestive heart failure (HCC) 01/24/2023   Chronic vaginitis 10/24/2022   Hyperlipidemia associated with type 2 diabetes mellitus (HCC) 10/24/2022   Chronic tension-type headache, not intractable 05/23/2022   Trigger finger, left little finger 05/23/2022   Takotsubo cardiomyopathy 04/24/2022   S/P gastric sleeve procedure 04/24/2022   Adjustment disorder with mixed anxiety and depressed mood 04/24/2022   Perimenopausal symptom 04/24/2022   NSTEMI  (non-ST elevated myocardial infarction) (HCC) 04/11/2022   Myopia with astigmatism and presbyopia, bilateral 03/19/2018   Obstructive sleep apnea (adult) (pediatric) 10/13/2012   Diabetes (HCC) 11/16/2009   Hypertension 11/16/2009    Past Surgical History:  Procedure Laterality Date   BARIATRIC SURGERY  2014   BREAST BIOPSY Left    lymph node bx benign   HEEL SPUR EXCISION     LEFT HEART CATH AND CORONARY ANGIOGRAPHY N/A 04/11/2022   Procedure: LEFT HEART CATH AND CORONARY ANGIOGRAPHY;  Surgeon: Dann Candyce RAMAN, MD;  Location: [facility];  Service: Cardiovascular;  Laterality: N/A;   MENISCUS REPAIR     tummy tuck      Family History  Problem Relation Age of Onset   Breast cancer Mother 46   Rashes / Skin problems Father    Parkinson's disease Father    Heart attack Maternal Grandmother 58   Alzheimer's disease Maternal Grandfather     Social History   Tobacco Use   Smoking status: Never   Smokeless tobacco: Never  Vaping Use   Vaping status: Never Used  Substance Use Topics   Alcohol use: Yes    Comment: occassionally   Drug use: Never     Current Outpatient Medications:    doxycycline  (VIBRAMYCIN ) 100 MG capsule, Take 1 capsule (100 mg total) by mouth 2 (two) times daily for 7 days., Disp: 14 capsule, Rfl: 0   Fezolinetant  (VEOZAH ) 45 MG TABS, Take 1 tablet (45 mg total) by mouth daily., Disp: 90 tablet, Rfl: 3   fluticasone  (FLONASE ) 50 MCG/ACT nasal spray, Place into both nostrils as needed for allergies or rhinitis., Disp: , Rfl:    ketoconazole (NIZORAL)  2 % shampoo, Apply 1 Application topically 2 (two) times a week., Disp: 120 mL, Rfl: 0   lisinopril  (ZESTRIL ) 40 MG tablet, Take 1 tablet (40 mg total) by mouth at bedtime., Disp: 90 tablet, Rfl: 3   metoprolol  succinate (TOPROL -XL) 100 MG 24 hr tablet, TAKE ONE TABLET BY MOUTH ONE TIME DAILY. TAKE WITH OR IMMEDIATELY FOLLOWING A MEAL, Disp: 30 tablet, Rfl: 0   Multiple Vitamins-Minerals (ONE-A-DAY 50  PLUS PO), Take 1 tablet by mouth at bedtime., Disp: , Rfl:    naproxen  (NAPROSYN ) 500 MG tablet, Bid prn pain, Disp: 60 tablet, Rfl: 2   nirmatrelvir /ritonavir  (PAXLOVID ) 20 x 150 MG & 10 x 100MG  TABS, Take 3 tablets by mouth 2 (two) times daily for 5 days. (Take nirmatrelvir  150 mg two tablets twice daily for 5 days and ritonavir  100 mg one tablet twice daily for 5 days) Patient GFR is 98, Disp: 30 tablet, Rfl: 0   nitroGLYCERIN  (NITROSTAT ) 0.4 MG SL tablet, Place 1 tablet (0.4 mg total) under the tongue every 5 (five) minutes x 3 doses as needed for chest pain., Disp: 25 tablet, Rfl: 12   rosuvastatin  (CRESTOR ) 40 MG tablet, Take 1 tablet (40 mg total) by mouth daily., Disp: 90 tablet, Rfl: 3   Semaglutide , 1 MG/DOSE, (OZEMPIC , 1 MG/DOSE,) 4 MG/3ML SOPN, Inject 1 mg into the skin once a week., Disp: 9 mL, Rfl: 1   spironolactone  (ALDACTONE ) 25 MG tablet, Take 0.5 tablets (12.5 mg total) by mouth daily., Disp: 45 tablet, Rfl: 3   triamcinolone  ointment (KENALOG ) 0.1 %, Apply 1 Application topically 2 (two) times daily., Disp: 30 g, Rfl: 0   venlafaxine  XR (EFFEXOR -XR) 37.5 MG 24 hr capsule, Take 1 capsule (37.5 mg total) by mouth daily with breakfast., Disp: 90 capsule, Rfl: 3  Allergies  Allergen Reactions   Lipitor [Atorvastatin] Itching    ROS: See pertinent positives and negatives per HPI.  OBSERVATIONS/OBJECTIVE:  VITALS per patient if applicable: Today's Vitals   07/14/24 1603  Weight: 183 lb (83 kg)  Height: 5' 7 (1.702 m)   Body mass index is 28.66 kg/m.    GENERAL: Alert and oriented. Appears well and in no acute distress.  HEENT: Atraumatic. Conjunctiva clear. No obvious abnormalities on inspection of external nose and ears.  NECK: Normal movements of the head and neck.  LUNGS: On inspection, no signs of respiratory distress. Breathing rate appears normal. No obvious gross SOB, gasping or wheezing, and no conversational dyspnea.  CV: No obvious cyanosis.  MS: Moves  all visible extremities without noticeable abnormality.  PSYCH/NEURO: Pleasant and cooperative. No obvious depression or anxiety. Speech and thought processing grossly intact.  ASSESSMENT AND PLAN:  Problem List Items Addressed This Visit       Other   COVID-19 - Primary   Symptoms started yesterday. With history of heart failure and diabetes, will have her start paxlovid  BID x5 days. Encourage fluids, rest. Can continue nasal lavage with sterile water. Stop rosuvastatin  while taking paxlovid .   Reviewed home care instructions for COVID. Advised self-isolation at home until fever free for 24 hours without tylenol  or ibuprofen and symptoms are starting to feel better. If symptoms, esp, dyspnea develops/worsens, recommend in-person evaluation at either an urgent care or the emergency room.       Relevant Medications   nirmatrelvir /ritonavir  (PAXLOVID ) 20 x 150 MG & 10 x 100MG  TABS     I discussed the assessment and treatment plan with the patient. The patient was provided an opportunity  to ask questions and all were answered. The patient agreed with the plan and demonstrated an understanding of the instructions.   The patient was advised to call back or seek an in-person evaluation if the symptoms worsen or if the condition fails to improve as anticipated.   Tinnie DELENA Harada, NP

## 2024-08-04 ENCOUNTER — Other Ambulatory Visit: Payer: Self-pay

## 2024-08-04 ENCOUNTER — Other Ambulatory Visit: Payer: Self-pay | Admitting: Nurse Practitioner

## 2024-08-04 DIAGNOSIS — E1165 Type 2 diabetes mellitus with hyperglycemia: Secondary | ICD-10-CM

## 2024-08-04 MED ORDER — METOPROLOL SUCCINATE ER 100 MG PO TB24: 100.0000 mg | ORAL_TABLET | Freq: Every day | ORAL | 2 refills | Status: AC

## 2024-08-16 ENCOUNTER — Encounter: Payer: Self-pay | Admitting: Nurse Practitioner

## 2024-08-16 ENCOUNTER — Other Ambulatory Visit (HOSPITAL_COMMUNITY)
Admission: RE | Admit: 2024-08-16 | Discharge: 2024-08-16 | Disposition: A | Discharge status: Home / Self Care | Source: Ambulatory Visit | Attending: Nurse Practitioner | Admitting: Nurse Practitioner

## 2024-08-16 ENCOUNTER — Ambulatory Visit: Admitting: Nurse Practitioner

## 2024-08-16 VITALS — BP 130/80 | HR 72 | Temp 98.3°F | Ht 67.0 in | Wt 186.8 lb

## 2024-08-16 DIAGNOSIS — Z1159 Encounter for screening for other viral diseases: Secondary | ICD-10-CM | POA: Diagnosis present

## 2024-08-16 DIAGNOSIS — N951 Menopausal and female climacteric states: Secondary | ICD-10-CM | POA: Diagnosis not present

## 2024-08-16 DIAGNOSIS — Z23 Encounter for immunization: Secondary | ICD-10-CM | POA: Diagnosis not present

## 2024-08-16 DIAGNOSIS — E1169 Type 2 diabetes mellitus with other specified complication: Secondary | ICD-10-CM

## 2024-08-16 DIAGNOSIS — E785 Hyperlipidemia, unspecified: Secondary | ICD-10-CM

## 2024-08-16 DIAGNOSIS — Z7985 Long-term (current) use of injectable non-insulin antidiabetic drugs: Secondary | ICD-10-CM

## 2024-08-16 LAB — RENAL FUNCTION PANEL
Albumin: 4.6 g/dL (ref 3.5–5.2)
BUN: 12 mg/dL (ref 6–23)
CO2: 32 meq/L (ref 19–32)
Calcium: 9.8 mg/dL (ref 8.4–10.5)
Chloride: 103 meq/L (ref 96–112)
Creatinine, Ser: 0.74 mg/dL (ref 0.40–1.20)
GFR: 91.13 mL/min (ref >60.00)
Glucose, Bld: 95 mg/dL (ref 70–99)
Phosphorus: 4.3 mg/dL (ref 2.3–4.6)
Potassium: 4.1 meq/L (ref 3.5–5.1)
Sodium: 143 meq/L (ref 135–145)

## 2024-08-16 LAB — MICROALBUMIN / CREATININE URINE RATIO
Creatinine,U: 200.4 mg/dL
Microalb Creat Ratio: 24.8 mg/g (ref 0.0–30.0)
Microalb, Ur: 5 mg/dL — ABNORMAL HIGH (ref 0.0–1.9)

## 2024-08-16 LAB — LIPID PANEL
Cholesterol: 128 mg/dL (ref 0–200)
HDL: 52.7 mg/dL (ref >39.00)
LDL Cholesterol: 53 mg/dL (ref 0–99)
NonHDL: 75.49
Total CHOL/HDL Ratio: 2
Triglycerides: 110 mg/dL (ref 0.0–149.0)
VLDL: 22 mg/dL (ref 0.0–40.0)

## 2024-08-16 LAB — HEMOGLOBIN A1C: Hgb A1c MFr Bld: 6.5 % (ref 4.6–6.5)

## 2024-08-16 MED ORDER — VENLAFAXINE HCL ER 75 MG PO CP24
75.0000 mg | ORAL_CAPSULE | Freq: Every day | ORAL | 3 refills | Status: AC
Start: 2024-08-16 — End: ?

## 2024-08-16 NOTE — Progress Notes (Signed)
 Established Patient Visit  Patient: Alicia Booth   DOB: 1971-07-27   53 y.o. Female  MRN: [id number] Visit Date: 08/16/2024  Subjective:    Chief Complaint  Patient presents with   Follow-up    FASTING 6 month follow up for HTN, DM and Hyperlipidemia  Discuss increasing Ozempic     HPI Perimenopausal symptom No improvement with veozah  Agreed to increase effexor  dose to 75mg  daily Encouraged to maintain a heart healthy diet  and daily exercise  Diabetes (HCC) Repeat hgbA1c and BMP No retinopathy or neuropathy LDL at goal with crestor  Maintain med dose-ozempic  F/up in 6months  Wt Readings from Last 3 Encounters:  08/16/24 186 lb 12.8 oz (84.7 kg)  07/14/24 183 lb (83 kg)  06/07/24 185 lb (83.9 kg)    Reviewed medical, surgical, and social history today  Medications: Outpatient Medications Prior to Visit  Medication Sig   fluticasone  (FLONASE ) 50 MCG/ACT nasal spray Place into both nostrils as needed for allergies or rhinitis.   ketoconazole (NIZORAL) 2 % shampoo Apply 1 Application topically 2 (two) times a week.   lisinopril  (ZESTRIL ) 40 MG tablet Take 1 tablet (40 mg total) by mouth at bedtime.   metoprolol  succinate (TOPROL -XL) 100 MG 24 hr tablet Take 1 tablet (100 mg total) by mouth daily. Take with or immediately following a meal.   Multiple Vitamins-Minerals (ONE-A-DAY 50 PLUS PO) Take 1 tablet by mouth at bedtime.   naproxen  (NAPROSYN ) 500 MG tablet Bid prn pain   nitroGLYCERIN  (NITROSTAT ) 0.4 MG SL tablet Place 1 tablet (0.4 mg total) under the tongue every 5 (five) minutes x 3 doses as needed for chest pain.   rosuvastatin  (CRESTOR ) 40 MG tablet Take 1 tablet (40 mg total) by mouth daily.   Semaglutide , 1 MG/DOSE, (OZEMPIC , 1 MG/DOSE,) 4 MG/3ML SOPN INJECT 1MG  UNDER THE SKIN ONCE WEEKLY ON THE SAME DAY EACH WEEK   spironolactone  (ALDACTONE ) 25 MG tablet Take 0.5 tablets (12.5 mg total) by mouth daily.   triamcinolone  ointment (KENALOG ) 0.1 % Apply 1  Application topically 2 (two) times daily.   [DISCONTINUED] Fezolinetant  (VEOZAH ) 45 MG TABS Take 1 tablet (45 mg total) by mouth daily.   [DISCONTINUED] venlafaxine  XR (EFFEXOR -XR) 37.5 MG 24 hr capsule Take 1 capsule (37.5 mg total) by mouth daily with breakfast.   No facility-administered medications prior to visit.   Reviewed past medical and social history.   ROS per HPI above      Objective:  BP 130/80 (BP Location: Left Arm, Patient Position: Sitting, Cuff Size: Large)   Pulse 72   Temp 98.3 F (36.8 C) (Oral)   Ht 5' 7 (1.702 m)   Wt 186 lb 12.8 oz (84.7 kg)   LMP 04/17/2024   SpO2 98%   BMI 29.26 kg/m      Physical Exam Vitals reviewed.  Cardiovascular:     Rate and Rhythm: Normal rate and regular rhythm.     Pulses: Normal pulses.     Heart sounds: Normal heart sounds.  Pulmonary:     Effort: Pulmonary effort is normal.     Breath sounds: Normal breath sounds.  Musculoskeletal:     Right lower leg: No edema.     Left lower leg: No edema.  Neurological:     Mental Status: She is alert and oriented to person, place, and time.  Psychiatric:        Mood and Affect: Mood  normal.        Behavior: Behavior normal.        Thought Content: Thought content normal.     No results found for any visits on 08/16/24.    Assessment & Plan:    Problem List Items Addressed This Visit     Diabetes (HCC)   Repeat hgbA1c and BMP No retinopathy or neuropathy LDL at goal with crestor  Maintain med dose-ozempic  F/up in 6months       Relevant Orders   Microalbumin / creatinine urine ratio   Hemoglobin A1c   Renal Function Panel   Hyperlipidemia associated with type 2 diabetes mellitus (HCC) - Primary   Relevant Orders   Lipid panel   Perimenopausal symptom   No improvement with veozah  Agreed to increase effexor  dose to 75mg  daily Encouraged to maintain a heart healthy diet  and daily exercise      Relevant Medications   venlafaxine  XR (EFFEXOR -XR) 75 MG 24  hr capsule   Other Visit Diagnoses       Immunization due       Relevant Orders   Flu vaccine HIGH DOSE PF(Fluzone Trivalent) (Completed)     Encounter for screening for viral disease       Relevant Orders   HIV Antibody (routine testing w rflx)   RPR   Urine cytology ancillary only      Return in about 6 months (around 02/13/2025) for HTN, DM, hyperlipidemia (fasting).     Roselie Mood, NP

## 2024-08-16 NOTE — Patient Instructions (Signed)
 Go to lab Maintain Heart healthy diet and daily exercise. Maintain current medications.

## 2024-08-16 NOTE — Assessment & Plan Note (Addendum)
 No improvement with veozah  Agreed to increase effexor  dose to 75mg  daily Encouraged to maintain a heart healthy diet  and daily exercise

## 2024-08-16 NOTE — Assessment & Plan Note (Signed)
 Repeat hgbA1c and BMP No retinopathy or neuropathy LDL at goal with crestor  Maintain med dose-ozempic  F/up in 6months

## 2024-08-17 ENCOUNTER — Encounter: Payer: Self-pay | Admitting: Nurse Practitioner

## 2024-08-17 ENCOUNTER — Ambulatory Visit: Payer: Self-pay | Admitting: Nurse Practitioner

## 2024-08-17 DIAGNOSIS — E1169 Type 2 diabetes mellitus with other specified complication: Secondary | ICD-10-CM

## 2024-08-17 DIAGNOSIS — Z7985 Long-term (current) use of injectable non-insulin antidiabetic drugs: Secondary | ICD-10-CM

## 2024-08-17 LAB — URINE CYTOLOGY ANCILLARY ONLY
Chlamydia: NEGATIVE
Comment: NEGATIVE
Comment: NEGATIVE
Comment: NORMAL
Neisseria Gonorrhea: NEGATIVE
Trichomonas: NEGATIVE

## 2024-08-17 LAB — RPR: RPR Ser Ql: NONREACTIVE

## 2024-08-17 LAB — HIV ANTIBODY (ROUTINE TESTING W REFLEX)
HIV 1&2 Ab, 4th Generation: NONREACTIVE
HIV FINAL INTERPRETATION: NEGATIVE

## 2024-08-18 MED ORDER — SEMAGLUTIDE (2 MG/DOSE) 8 MG/3ML ~~LOC~~ SOPN: 2.0000 mg | PEN_INJECTOR | SUBCUTANEOUS | 1 refills | Status: AC

## 2024-10-05 ENCOUNTER — Ambulatory Visit: Admitting: Orthopaedic Surgery

## 2024-10-11 ENCOUNTER — Encounter: Payer: Self-pay | Admitting: Radiology

## 2024-10-14 ENCOUNTER — Encounter: Payer: Self-pay | Admitting: Nurse Practitioner

## 2024-10-14 ENCOUNTER — Other Ambulatory Visit: Payer: Self-pay

## 2024-10-14 ENCOUNTER — Ambulatory Visit: Admitting: Orthopaedic Surgery

## 2024-10-14 VITALS — Ht 68.0 in | Wt 183.4 lb

## 2024-10-14 DIAGNOSIS — M1712 Unilateral primary osteoarthritis, left knee: Secondary | ICD-10-CM

## 2024-10-14 NOTE — Progress Notes (Signed)
 Office Visit Note   Patient: Alicia Booth           Date of Birth: November 28, 1971           MRN: [id number] Visit Date: 10/14/2024              Requested by: Katheen Roselie Rockford, NP [address] [place],  [place] [address] PCP: Katheen Roselie Rockford, NP   Assessment & Plan: Visit Diagnoses:  1. Unilateral primary osteoarthritis, left knee     Plan: History of Present Illness Alicia Booth is a 53 year old female with knee osteoarthritis who presents with worsening left knee pain.  She experiences constant, dull, aching pain in her left knee, now occurring without provocation. The pain is accompanied by crunching and clicking sounds in both knees, with the left being worse. She manages the pain with Tylenol  Arthritis, but it affects her quality of life and complicates planning for knee replacement surgery due to work and personal commitments.  Her past medical history includes a previous knee arthroscopy, and she is aware of arthritic changes in her knees from prior x-rays. She is concerned about the recovery process and the impact on her mobility, especially with 17 stairs at home.  She has well-managed high blood pressure and has been cleared by her cardiologist, requiring only follow-up with her primary care provider. She is unsure about a nickel allergy but notes a history of reactions to certain metals.  Examination of the left knee shows pain with range of motion with crepitus.  Collaterals and cruciates are stable.  Medial joint line tenderness.  No joint effusion.  Functional range of motion.  Results RADIOLOGY Knee X-ray: Severe osteoarthritis of the left knee  Assessment and Plan Left knee primary osteoarthritis Chronic left knee osteoarthritis with significant arthritic changes confirmed by X-rays. Symptoms affect quality of life, necessitating knee replacement surgery. Discussed surgery timing, recovery expectations, and importance of physical therapy. Addressed  concerns about self-sufficiency post-surgery. - Scheduled left knee replacement surgery for mid-February, around the week of the 23rd. - Obtain medical clearance from primary care provider, Nishae. - Use a nickel-free implant due to potential nickel allergy. - Provided handout on knee replacement surgery.  Impression is severe left knee degenerative joint disease secondary to Osteoarthritis.  Patient has attempted conservative treatment for at least 6 consecutive weeks within the past 12 weeks, including but not limited to physical therapy, home exercise program, NSAIDs, activity modification, and/or corticosteroid injections. Despite these efforts, symptoms have not improved or have worsened. Conservative measures have been deemed unsuccessful at this time. After a detailed discussion covering diagnosis and treatment options--including the risks, benefits, alternatives, and potential complications of surgical and nonsurgical management--the patient elected to proceed with surgery  Follow-Up Instructions: No follow-ups on file.   Orders:  Orders Placed This Encounter  Procedures   XR KNEE 3 VIEW LEFT   No orders of the defined types were placed in this encounter.     Procedures: No procedures performed   Clinical Data: No additional findings.   Subjective: Chief Complaint  Patient presents with   Left Knee - Pain    HPI  Review of Systems  Constitutional: Negative.   HENT: Negative.    Eyes: Negative.   Respiratory: Negative.    Cardiovascular: Negative.   Endocrine: Negative.   Musculoskeletal: Negative.   Neurological: Negative.   Hematological: Negative.   Psychiatric/Behavioral: Negative.    All other systems reviewed and are negative.  Objective: Vital Signs: Ht 5' 8 (1.727 m)   Wt 183 lb 6.4 oz (83.2 kg)   BMI 27.89 kg/m   Physical Exam Vitals and nursing note reviewed.  Constitutional:      Appearance: She is well-developed.  HENT:     Head:  Atraumatic.     Nose: Nose normal.  Eyes:     Extraocular Movements: Extraocular movements intact.  Cardiovascular:     Pulses: Normal pulses.  Pulmonary:     Effort: Pulmonary effort is normal.  Abdominal:     Palpations: Abdomen is soft.  Musculoskeletal:     Cervical back: Neck supple.  Skin:    General: Skin is warm.     Capillary Refill: Capillary refill takes less than 2 seconds.  Neurological:     Mental Status: She is alert. Mental status is at baseline.  Psychiatric:        Behavior: Behavior normal.        Thought Content: Thought content normal.        Judgment: Judgment normal.     Ortho Exam  Specialty Comments:  No specialty comments available.  Imaging: XR KNEE 3 VIEW LEFT Result Date: 10/14/2024 X-rays of the left knee show advanced tricompartmental osteoarthritis.  Bone-on-bone joint space narrowing.  Kellgren-Lawrence stage IV    PMFS History: Patient Active Problem List   Diagnosis Date Noted   Other fatigue 07/25/2023   Seborrheic dermatitis of scalp 05/22/2023   Flexural atopic dermatitis 05/22/2023   Chronic diastolic congestive heart failure (HCC) 01/24/2023   Chronic vaginitis 10/24/2022   Hyperlipidemia associated with type 2 diabetes mellitus (HCC) 10/24/2022   Chronic tension-type headache, not intractable 05/23/2022   Trigger finger, left little finger 05/23/2022   Takotsubo cardiomyopathy 04/24/2022   S/P gastric sleeve procedure 04/24/2022   Adjustment disorder with mixed anxiety and depressed mood 04/24/2022   Perimenopausal symptom 04/24/2022   NSTEMI (non-ST elevated myocardial infarction) (HCC) 04/11/2022   Myopia with astigmatism and presbyopia, bilateral 03/19/2018   Obstructive sleep apnea (adult) (pediatric) 10/13/2012   Diabetes (HCC) 11/16/2009   Hypertension 11/16/2009   Past Medical History:  Diagnosis Date   COVID-19 07/14/2024   Diabetes (HCC)    High cholesterol    Hypertension    Stress-induced cardiomyopathy      Family History  Problem Relation Age of Onset   Breast cancer Mother 67   Rashes / Skin problems Father    Parkinson's disease Father    Heart attack Maternal Grandmother 73   Alzheimer's disease Maternal Grandfather     Past Surgical History:  Procedure Laterality Date   BARIATRIC SURGERY  2014   BREAST BIOPSY Left    lymph node bx benign   HEEL SPUR EXCISION     LEFT HEART CATH AND CORONARY ANGIOGRAPHY N/A 04/11/2022   Procedure: LEFT HEART CATH AND CORONARY ANGIOGRAPHY;  Surgeon: Dann Candyce RAMAN, MD;  Location: [facility];  Service: Cardiovascular;  Laterality: N/A;   MENISCUS REPAIR     tummy tuck     Social History   Occupational History   Not on file  Tobacco Use   Smoking status: Never   Smokeless tobacco: Never  Vaping Use   Vaping status: Never Used  Substance and Sexual Activity   Alcohol use: Yes    Comment: occassionally   Drug use: Never   Sexual activity: Yes

## 2024-11-08 ENCOUNTER — Ambulatory Visit: Payer: Self-pay

## 2024-11-08 NOTE — Telephone Encounter (Signed)
 FYI Only or Action Required?: FYI only for provider: appointment scheduled on 11/10/2024.  Patient was last seen in primary care on 08/16/2024 by Nche, Roselie Rockford, NP.  Called Nurse Triage reporting Hematuria.  Symptoms began about a month ago.  Interventions attempted: Nothing.  Symptoms are: gradually worsening.  Triage Disposition: See Physician Within 24 Hours  Patient/caregiver understands and will follow disposition?: Yes  Copied from CRM [id number]. Topic: Clinical - Red Word Triage >> Nov 08, 2024  1:50 PM Lonell PEDLAR wrote: Red Word that prompted transfer to Nurse Triage: believes to have blood in urine Reason for Disposition  Blood in urine  (Exception: Could be normal menstrual bleeding.)  Answer Assessment - Initial Assessment Questions 1. COLOR of URINE: Describe the color of the urine.  (e.g., tea-colored, pink, red, bloody) Do you have blood clots in your urine? (e.g., none, pea, grape, small coin)     Dark yellow or rusty orange 2. ONSET: When did the bleeding start?      4 - 6 weeks of consistently discolored urine.  Prior to this, one to two times per week 3. EPISODES: How many times has there been blood in the urine? or How many times today?     With each urination 4. PAIN with URINATION: Is there any pain with passing your urine? If Yes, ask: How bad is the pain?  (Scale 1-10; or mild, moderate, severe)     denies 5. FEVER: Do you have a fever? If Yes, ask: What is your temperature, how was it measured, and when did it start?     denies 6. ASSOCIATED SYMPTOMS: Are you passing urine more frequently than usual?     Denies urinary frequency, reports that she only drinks about 20 oz water/day 7. OTHER SYMPTOMS: Do you have any other symptoms? (e.g., back/flank pain, abdomen pain, vomiting)     Left lower abdomen, near groin, intermittent pain not worsened or relieved with urination  Protocols used: Urine - Blood In-A-AH

## 2024-11-09 NOTE — Telephone Encounter (Signed)
 Noted. Patient was triaged and scheduled 11/10/24

## 2024-11-10 ENCOUNTER — Ambulatory Visit: Payer: Self-pay | Admitting: Nurse Practitioner

## 2024-11-10 ENCOUNTER — Ambulatory Visit: Admitting: Nurse Practitioner

## 2024-11-10 ENCOUNTER — Other Ambulatory Visit: Payer: Self-pay | Admitting: Nurse Practitioner

## 2024-11-10 ENCOUNTER — Encounter: Payer: Self-pay | Admitting: Nurse Practitioner

## 2024-11-10 VITALS — BP 128/80 | HR 83 | Ht 67.0 in | Wt 185.0 lb

## 2024-11-10 DIAGNOSIS — R319 Hematuria, unspecified: Secondary | ICD-10-CM

## 2024-11-10 DIAGNOSIS — R1032 Left lower quadrant pain: Secondary | ICD-10-CM

## 2024-11-10 LAB — POC URINALSYSI DIPSTICK (AUTOMATED)
Bilirubin, UA: NEGATIVE
Glucose, UA: NEGATIVE
Ketones, UA: NEGATIVE
Leukocytes, UA: NEGATIVE
Nitrite, UA: NEGATIVE
Protein, UA: POSITIVE — AB
Spec Grav, UA: 1.025 (ref 1.010–1.025)
Urobilinogen, UA: NEGATIVE U/dL — AB
pH, UA: 6 (ref 5.0–8.0)

## 2024-11-10 LAB — COMPREHENSIVE METABOLIC PANEL WITH GFR
ALT: 24 U/L (ref 0–35)
AST: 22 U/L (ref 0–37)
Albumin: 4.5 g/dL (ref 3.5–5.2)
Alkaline Phosphatase: 54 U/L (ref 39–117)
BUN: 9 mg/dL (ref 6–23)
CO2: 32 meq/L (ref 19–32)
Calcium: 9.7 mg/dL (ref 8.4–10.5)
Chloride: 101 meq/L (ref 96–112)
Creatinine, Ser: 0.66 mg/dL (ref 0.40–1.20)
GFR: 98.65 mL/min (ref >60.00)
Glucose, Bld: 77 mg/dL (ref 70–99)
Potassium: 3.5 meq/L (ref 3.5–5.1)
Sodium: 141 meq/L (ref 135–145)
Total Bilirubin: 0.5 mg/dL (ref 0.2–1.2)
Total Protein: 7.1 g/dL (ref 6.0–8.3)

## 2024-11-10 LAB — POCT URINE PREGNANCY: Preg Test, Ur: NEGATIVE

## 2024-11-10 NOTE — Patient Instructions (Signed)
 Go to lab Maintain adequate oral hydration You will be contacted to schedule appointment for CT.

## 2024-11-10 NOTE — Assessment & Plan Note (Addendum)
 She noticed dark yellow to rusty colo urine x 2weeks, no improvement with increased oral hydration. Associated with LLQ pain x 1week. No dysuria, no dyspareunia, LMP 04/2024-perimenopause Hx of chronic lower back pain, no change Hx of right nephrolithiasis She had previous incident of hematuria 2019: had eval by urology. CT ABDOMEN/pelvis and cystoscopy completed. No known cause of hematuria at the time.  Check urine pregnancy-negative POCT UA: 3+ RBC, elevated bilirbun Sent urine for culture Check CMP Ordered CT ABDOMEN/pelvis Encouraged to maintain adequate oral hydration

## 2024-11-10 NOTE — Progress Notes (Signed)
 Acute Office Visit  Subjective:    Patient ID: Alicia Booth, female    DOB: 12/29/1970, 53 y.o.   MRN: [id number]  Chief Complaint  Patient presents with   Hematuria    Blood in urine for some weeks and lower back pain and left side abdominal pain    Hematuria She noticed dark yellow to rusty colo urine x 2weeks, no improvement with increased oral hydration. Associated with LLQ pain x 1week. No dysuria, no dyspareunia, LMP 04/2024-perimenopause Hx of chronic lower back pain, no change Hx of right nephrolithiasis She had previous incident of hematuria 2019: had eval by urology. CT ABDOMEN/pelvis and cystoscopy completed. No known cause of hematuria at the time.  Check urine pregnancy-negative POCT UA: 3+ RBC, elevated bilirbun Sent urine for culture Check CMP Ordered CT ABDOMEN/pelvis Encouraged to maintain adequate oral hydration   Outpatient Medications Prior to Visit  Medication Sig   Acetaminophen  (TYLENOL  8 HOUR ARTHRITIS PAIN PO) Take 1 tablet by mouth daily at 6 (six) AM.   fluticasone  (FLONASE ) 50 MCG/ACT nasal spray Place into both nostrils as needed for allergies or rhinitis.   ketoconazole (NIZORAL) 2 % shampoo Apply 1 Application topically 2 (two) times a week.   lisinopril  (ZESTRIL ) 40 MG tablet Take 1 tablet (40 mg total) by mouth at bedtime.   metoprolol  succinate (TOPROL -XL) 100 MG 24 hr tablet Take 1 tablet (100 mg total) by mouth daily. Take with or immediately following a meal.   Multiple Vitamins-Minerals (ONE-A-DAY 50 PLUS PO) Take 1 tablet by mouth at bedtime.   nitroGLYCERIN  (NITROSTAT ) 0.4 MG SL tablet Place 1 tablet (0.4 mg total) under the tongue every 5 (five) minutes x 3 doses as needed for chest pain.   rosuvastatin  (CRESTOR ) 40 MG tablet Take 1 tablet (40 mg total) by mouth daily.   Semaglutide , 2 MG/DOSE, 8 MG/3ML SOPN Inject 2 mg as directed once a week.   spironolactone  (ALDACTONE ) 25 MG tablet Take 0.5 tablets (12.5 mg total) by mouth daily.    triamcinolone  ointment (KENALOG ) 0.1 % Apply 1 Application topically 2 (two) times daily.   venlafaxine  XR (EFFEXOR -XR) 75 MG 24 hr capsule Take 1 capsule (75 mg total) by mouth daily with breakfast.   VEOZAH  45 MG TABS Take 1 tablet by mouth daily.   [DISCONTINUED] naproxen  (NAPROSYN ) 500 MG tablet Bid prn pain (Patient not taking: Reported on 11/10/2024)   No facility-administered medications prior to visit.    Reviewed past medical and social history.  Review of Systems  Genitourinary:  Positive for hematuria.   Per HPI     Objective:    Physical Exam Vitals and nursing note reviewed.  Cardiovascular:     Rate and Rhythm: Normal rate.     Pulses: Normal pulses.  Pulmonary:     Effort: Pulmonary effort is normal.  Abdominal:     General: Bowel sounds are normal. There is no distension.     Palpations: Abdomen is soft. There is no mass.     Tenderness: There is abdominal tenderness in the left lower quadrant. There is no right CVA tenderness, left CVA tenderness, guarding or rebound. Negative signs include Murphy's sign, Rovsing's sign, McBurney's sign and psoas sign.     Hernia: No hernia is present.  Neurological:     Mental Status: She is alert.     BP 128/80 (BP Location: Left Arm, Patient Position: Sitting, Cuff Size: Large)   Pulse 83   Ht 5' 7 (1.702 m)  Wt 185 lb (83.9 kg)   LMP 04/17/2024   SpO2 97%   BMI 28.98 kg/m    Results for orders placed or performed in visit on 11/10/24  POCT Urinalysis Dipstick (Automated)  Result Value Ref Range   Color, UA Dark Yellow    Clarity, UA Cloudy    Glucose, UA Negative Negative   Bilirubin, UA Negative    Ketones, UA Negative    Spec Grav, UA 1.025 1.010 - 1.025   Blood, UA 3+    pH, UA 6.0 5.0 - 8.0   Protein, UA Positive (A) Negative   Urobilinogen, UA negative (A) 0.2 or 1.0 E.U./dL   Nitrite, UA Negative    Leukocytes, UA Negative Negative  POCT urine pregnancy  Result Value Ref Range   Preg Test, Ur  Negative Negative      Assessment & Plan:   Problem List Items Addressed This Visit     Hematuria - Primary   She noticed dark yellow to rusty colo urine x 2weeks, no improvement with increased oral hydration. Associated with LLQ pain x 1week. No dysuria, no dyspareunia, LMP 04/2024-perimenopause Hx of chronic lower back pain, no change Hx of right nephrolithiasis She had previous incident of hematuria 2019: had eval by urology. CT ABDOMEN/pelvis and cystoscopy completed. No known cause of hematuria at the time.  Check urine pregnancy-negative POCT UA: 3+ RBC, elevated bilirbun Sent urine for culture Check CMP Ordered CT ABDOMEN/pelvis Encouraged to maintain adequate oral hydration      Relevant Orders   POCT Urinalysis Dipstick (Automated) (Completed)   POCT urine pregnancy (Completed)   Urine Culture   Comprehensive metabolic panel with GFR   Other Visit Diagnoses       LLQ abdominal pain         Left lower quadrant abdominal pain       Relevant Orders   CT ABDOMEN PELVIS W CONTRAST      No orders of the defined types were placed in this encounter.  Return if symptoms worsen or fail to improve.    Roselie Mood, NP

## 2024-11-11 LAB — URINE CULTURE
MICRO NUMBER:: 17307370
Result:: NO GROWTH
SPECIMEN QUALITY:: ADEQUATE

## 2024-11-12 ENCOUNTER — Inpatient Hospital Stay: Admission: RE | Admit: 2024-11-12 | Discharge: 2024-11-12 | Attending: Nurse Practitioner

## 2024-11-12 ENCOUNTER — Telehealth: Payer: Self-pay

## 2024-11-12 DIAGNOSIS — R1032 Left lower quadrant pain: Secondary | ICD-10-CM

## 2024-11-12 MED ORDER — IOPAMIDOL (ISOVUE-300) INJECTION 61%
100.0000 mL | Freq: Once | INTRAVENOUS | Status: AC | PRN
  Administered 2024-11-12: 100 mL via INTRAVENOUS

## 2024-11-12 NOTE — Telephone Encounter (Signed)
 Called patient and informed her that I did not call her and asked that I see that she has read her result comments from [place] and asked if she has any questions. She stated that she did not have any questions and she will be waiting to hear back about her results from the scan she had today. I informed her that once the report comes in that [place] will send he a MyChart message with the results. She thanked me for calling.

## 2024-11-12 NOTE — Telephone Encounter (Signed)
 Copied from CRM [id number]. Topic: General - Other >> Nov 12, 2024 11:35 AM Alfonso HERO wrote: Reason for CRM: patient returning call. No notes as to who called. If someone from the office called her can you please call her back.

## 2024-11-19 ENCOUNTER — Telehealth: Payer: Self-pay | Admitting: Cardiology

## 2024-11-19 NOTE — Telephone Encounter (Signed)
 Patient states she has been having intermittent palpitations for the past couple of weeks. She describes it as hard heartbeats that she can feel in her chest and neck. They are short-lived, only lasting for 1 beat. She states in the past 4 hours she experienced an episode of this 3 times.  She states she has been experiencing more stress recently, work is beating me down.  She denies any chest pain or SOB. She has appt with Scot Ford, PA-C on 11/23/24 and will wait for this appt to evaluate symptoms further.  Reviewed ED precautions and NTG use with patient, patient verbalized understanding.  She states she feels OK for now and wanted to make Dr. Pietro aware.  Will forward to Dr. Pietro and Hao to review.

## 2024-11-19 NOTE — Telephone Encounter (Signed)
 Patient c/o Palpitations: STAT if patient c/o lightheadedness, shortness of breath, or chest pain  How long have you had palpitations/irregular HR/ Afib? Are you having the symptoms now? yes  Are you currently experiencing lightheadedness, SOB or CP? Slightly cp  Do you have a history of afib (atrial fibrillation) or irregular heart rhythm? No sure  Have you checked your BP or HR? (document readings if available): no  Are you experiencing any other symptoms?     Patient wanted an appt schd for 12/16.

## 2024-11-23 ENCOUNTER — Encounter: Payer: Self-pay | Admitting: Physician Assistant

## 2024-11-23 ENCOUNTER — Ambulatory Visit: Attending: Physician Assistant | Admitting: Physician Assistant

## 2024-11-23 ENCOUNTER — Ambulatory Visit

## 2024-11-23 VITALS — BP 120/66 | HR 74 | Ht 67.0 in | Wt 175.0 lb

## 2024-11-23 DIAGNOSIS — I5032 Chronic diastolic (congestive) heart failure: Secondary | ICD-10-CM

## 2024-11-23 DIAGNOSIS — R002 Palpitations: Secondary | ICD-10-CM

## 2024-11-23 DIAGNOSIS — I10 Essential (primary) hypertension: Secondary | ICD-10-CM

## 2024-11-23 DIAGNOSIS — I420 Dilated cardiomyopathy: Secondary | ICD-10-CM

## 2024-11-23 DIAGNOSIS — I214 Non-ST elevation (NSTEMI) myocardial infarction: Secondary | ICD-10-CM

## 2024-11-23 NOTE — Progress Notes (Unsigned)
°  Cardiology Office Note   Date:  11/23/2024  ID:  ABBYE Booth, DOB 1971-09-25, MRN [id number] PCP: Katheen Roselie Rockford, NP  [facility] Providers Cardiologist:  Redell Shallow, MD { Click to update primary MD,subspecialty MD or APP then REFRESH:1}    History of Present Illness Alicia Booth is a 53 y.o. female with past medical history of Takotsubo cardiomyopathy, hypertension and hyperlipidemia.  Patient was admitted to the hospital in May 2023 with NSTEMI.  Echocardiogram showed hypokinesis of the distal inferolateral wall and apex, mild LVH.  Subsequent cardiac catheterization showed EF 25 to 35% with no coronary artery disease, LVEDP 25 mmHg, overall felt to be consistent with stress-induced cardiomyopathy.  Follow-up echocardiogram in August 2023 showed normal EF, grade 1 DD, mild to moderate LAE.  Patient was last seen by Dr. Shallow in June 2025 at which time she was doing well.  She was recently seen by PCP for hematuria.  Urinalysis showed 3+ blood, positive protein, negative urobilinogen.  Urine culture was negative.  CMP was normal.  CT of abdomen and pelvis showed multiple large nonobstructive right renal calculi measuring up to 9 mm, no acute intra-abdominal or pelvic abnormality.  Patient presents today for follow-up and evaluation palpitation.  She has been having intermittent palpitation for the past 2 weeks.  Symptom was only last a second each time but may recur several times in a day.  She can also go for several days without occurring.  She denies any chest pain or significant shortness of breath.  She has similar symptom prior to the diagnosis of Takotsubo cardiomyopathy.  She is under increased stress at work.  I recommended echocardiogram and a 2-week ZIO monitor.  I suspect she is describing symptomatic PVCs.  We talked about cutting back on caffeine intake.  I will also obtain a TSH to rule out hyperthyroidism.  I will see the patient back in 6 weeks.  ROS:  ***  Studies Reviewed      *** Risk Assessment/Calculations {Does this patient have ATRIAL FIBRILLATION?:[phone]}         Physical Exam VS:  BP 120/66 (BP Location: Right Arm, Patient Position: Sitting, Cuff Size: Large)   Pulse 74   Ht 5' 7 (1.702 m)   Wt 175 lb (79.4 kg)   LMP 04/17/2024   SpO2 96%   BMI 27.41 kg/m        Wt Readings from Last 3 Encounters:  11/23/24 175 lb (79.4 kg)  11/10/24 185 lb (83.9 kg)  10/14/24 183 lb 6.4 oz (83.2 kg)    GEN: Well nourished, well developed in no acute distress NECK: No JVD; No carotid bruits CARDIAC: ***RRR, no murmurs, rubs, gallops RESPIRATORY:  Clear to auscultation without rales, wheezing or rhonchi  ABDOMEN: Soft, non-tender, non-distended EXTREMITIES:  No edema; No deformity   ASSESSMENT AND PLAN ***    {Are you ordering a CV Procedure (e.g. stress test, cath, DCCV, TEE, etc)?   Press F2        :[id number]}  Dispo: ***  Signed, Scot Ford, PA

## 2024-11-23 NOTE — Patient Instructions (Signed)
 Medication Instructions:  NO CHANGES *If you need a refill on your cardiac medications before your next appointment, please call your pharmacy*  Lab Work: TSH TODAY If you have labs (blood work) drawn today and your tests are completely normal, you will receive your results only by: MyChart Message (if you have MyChart) OR A paper copy in the mail If you have any lab test that is abnormal or we need to change your treatment, we will call you to review the results.  Testing/Procedures:[address] Your physician has requested that you have an echocardiogram. Echocardiography is a painless test that uses sound waves to create images of your heart. It provides your doctor with information about the size and shape of your heart and how well your hearts chambers and valves are working. This procedure takes approximately one hour. There are no restrictions for this procedure. Please do NOT wear cologne, perfume, aftershave, or lotions (deodorant is allowed). Please arrive 15 minutes prior to your appointment time.  Please note: We ask at that you not bring children with you during ultrasound (echo/ vascular) testing. Due to room size and safety concerns, children are not allowed in the ultrasound rooms during exams. Our front office staff cannot provide observation of children in our lobby area while testing is being conducted. An adult accompanying a patient to their appointment will only be allowed in the ultrasound room at the discretion of the ultrasound technician under special circumstances. We apologize for any inconvenience.   Follow-Up: At [facility], you and your health needs are our priority.  As part of our continuing mission to provide you with exceptional heart care, our providers are all part of one team.  This team includes your primary Cardiologist (physician) and Advanced Practice Providers or APPs (Physician Assistants and Nurse Practitioners) who all work together to  provide you with the care you need, when you need it.  Your next appointment:   6 week(s)  Provider:   Scot Ford, PA-C   Other Instructions ZIO XT- Long Term Monitor Instructions  Your physician has requested you wear a ZIO patch monitor for 14 days.  This is a single patch monitor. [employer] supplies one patch monitor per enrollment. Additional stickers are not available. Please do not apply patch if you will be having a Nuclear Stress Test,  Echocardiogram, Cardiac CT, MRI, or Chest Xray during the period you would be wearing the  monitor. The patch cannot be worn during these tests. You cannot remove and re-apply the  ZIO XT patch monitor.  Your ZIO patch monitor will be mailed 3 day USPS to your address on file. It may take 3-5 days  to receive your monitor after you have been enrolled.  Once you have received your monitor, please review the enclosed instructions. Your monitor  has already been registered assigning a specific monitor serial # to you.  Billing and Patient Assistance Program Information  We have supplied [employer] with any of your insurance information on file for billing purposes. [employer] offers a sliding scale Patient Assistance Program for patients that do not have  insurance, or whose insurance does not completely cover the cost of the ZIO monitor.  You must apply for the Patient Assistance Program to qualify for this discounted rate.  To apply, please call [employer] at [phone], select option 4, select option 2, ask to apply for  Patient Assistance Program. Meredeth will ask your household income, and how many people  are in your household. They  will quote your out-of-pocket cost based on that information.  [employer] will also be able to set up a 71-month, interest-free payment plan if needed.  Applying the monitor   Shave hair from upper left chest.  Hold abrader disc by orange tab. Rub abrader in 40 strokes over the upper left chest as  indicated in your  monitor instructions.  Clean area with 4 enclosed alcohol pads. Let dry.  Apply patch as indicated in monitor instructions. Patch will be placed under collarbone on left  side of chest with arrow pointing upward.  Rub patch adhesive wings for 2 minutes. Remove white label marked 1. Remove the white  label marked 2. Rub patch adhesive wings for 2 additional minutes.  While looking in a mirror, press and release button in center of patch. A small green light will  flash 3-4 times. This will be your only indicator that the monitor has been turned on.  Do not shower for the first 24 hours. You may shower after the first 24 hours.  Press the button if you feel a symptom. You will hear a small click. Record Date, Time and  Symptom in the Patient Logbook.  When you are ready to remove the patch, follow instructions on the last 2 pages of Patient  Logbook. Stick patch monitor onto the last page of Patient Logbook.  Place Patient Logbook in the blue and white box. Use locking tab on box and tape box closed  securely. The blue and white box has prepaid postage on it. Please place it in the mailbox as  soon as possible. Your physician should have your test results approximately 7 days after the  monitor has been mailed back to [facility].  Call [facility] Customer Care at [phone] if you have questions regarding  your ZIO XT patch monitor. Call them immediately if you see an orange light blinking on your  monitor.  If your monitor falls off in less than 4 days, contact our Monitor department at [phone].  If your monitor becomes loose or falls off after 4 days call [employer] at [phone] for  suggestions on securing your monitor

## 2024-11-23 NOTE — Progress Notes (Unsigned)
 Enrolled for [employer] to mail a ZIO XT long term holter monitor to the patients address on file.   Dr. Jens Som to read.

## 2024-11-24 ENCOUNTER — Ambulatory Visit: Payer: Self-pay | Admitting: Physician Assistant

## 2024-11-24 LAB — TSH: TSH: 0.489 u[IU]/mL (ref 0.450–4.500)

## 2024-11-24 NOTE — Telephone Encounter (Signed)
 Spoke with pt, Aware of dr vertie recommendations.

## 2024-11-24 NOTE — Progress Notes (Signed)
 Normal thyroid level

## 2024-12-17 ENCOUNTER — Encounter: Payer: Self-pay | Admitting: Obstetrics and Gynecology

## 2024-12-20 ENCOUNTER — Ambulatory Visit: Admitting: Family Medicine

## 2024-12-20 ENCOUNTER — Ambulatory Visit: Payer: Self-pay

## 2024-12-20 ENCOUNTER — Encounter: Payer: Self-pay | Admitting: Family Medicine

## 2024-12-20 VITALS — BP 130/78 | HR 84 | Temp 98.1°F | Ht 67.0 in | Wt 181.8 lb

## 2024-12-20 DIAGNOSIS — R002 Palpitations: Secondary | ICD-10-CM | POA: Diagnosis not present

## 2024-12-20 DIAGNOSIS — R1032 Left lower quadrant pain: Secondary | ICD-10-CM | POA: Diagnosis not present

## 2024-12-20 NOTE — Progress Notes (Signed)
 " [facility] [address] [place] [address] Dept: [phone] Dept Fax: [phone]  Office Visit  Subjective:    Patient ID: Alicia Booth, female    DOB: [birth year], 54 y.o..   MRN: [id number]  Chief Complaint  Patient presents with   Abdominal Pain    C/o having lower abdominal pain on the LT side x 5 weeks that has gotten worse.  Has been taking NSAIDs.     History of Present Illness:  Patient is in today complaining of left lower abdominal pain ongoing for 5 weeks. She was seen by Ms. Nche in  early December complaining of dark urine, low back pain and left lower quadrant pain. Ms. Katheen ordered blood tests, a UA, and a CT of the abdomen and pelvis. These did not show any specific etiology. Ms. Baskett has been seen since then by urology, as she ahs a history of kidney stones and did have signs of stones ont he right side. She notes the urologist repeated a CT scan (I do not have a report from this). Ms. Thuman had a concern for possible diverticulitis, noting she eats quite a bit of popcorn and was concerned a kernel may have become trapped. Also, she tells me she has a history of a cyst on her left ovary for some years.  Past Medical History: Patient Active Problem List   Diagnosis Date Noted   Hematuria 11/10/2024   Other fatigue 07/25/2023   Seborrheic dermatitis of scalp 05/22/2023   Flexural atopic dermatitis 05/22/2023   Chronic diastolic congestive heart failure (HCC) 01/24/2023   Chronic vaginitis 10/24/2022   Hyperlipidemia associated with type 2 diabetes mellitus (HCC) 10/24/2022   Chronic tension-type headache, not intractable 05/23/2022   Trigger finger, left little finger 05/23/2022   Takotsubo cardiomyopathy 04/24/2022   S/P gastric sleeve procedure 04/24/2022   Adjustment disorder with mixed anxiety and depressed mood 04/24/2022   Perimenopausal symptom 04/24/2022   NSTEMI (non-ST elevated myocardial  infarction) (HCC) 04/11/2022   Myopia with astigmatism and presbyopia, bilateral 03/19/2018   Obstructive sleep apnea (adult) (pediatric) 10/13/2012   Diabetes (HCC) 11/16/2009   Hypertension 11/16/2009   Past Surgical History:  Procedure Laterality Date   BARIATRIC SURGERY  2014   BREAST BIOPSY Left    lymph node bx benign   HEEL SPUR EXCISION     LEFT HEART CATH AND CORONARY ANGIOGRAPHY N/A 04/11/2022   Procedure: LEFT HEART CATH AND CORONARY ANGIOGRAPHY;  Surgeon: Dann Candyce RAMAN, MD;  Location: [facility];  Service: Cardiovascular;  Laterality: N/A;   MENISCUS REPAIR     tummy tuck     Family History  Problem Relation Age of Onset   Breast cancer Mother 10   Rashes / Skin problems Father    Parkinson's disease Father    Heart attack Maternal Grandmother 19   Alzheimer's disease Maternal Grandfather    Outpatient Medications Prior to Visit  Medication Sig Dispense Refill   Acetaminophen  (TYLENOL  8 HOUR ARTHRITIS PAIN PO) Take 1 tablet by mouth daily at 6 (six) AM.     fluticasone  (FLONASE ) 50 MCG/ACT nasal spray Place into both nostrils as needed for allergies or rhinitis.     ketoconazole (NIZORAL) 2 % shampoo Apply 1 Application topically 2 (two) times a week. 120 mL 0   lisinopril  (ZESTRIL ) 40 MG tablet Take 1 tablet (40 mg total) by mouth at bedtime. 90 tablet 3   metoprolol  succinate (TOPROL -XL) 100 MG 24  hr tablet Take 1 tablet (100 mg total) by mouth daily. Take with or immediately following a meal. 90 tablet 2   Multiple Vitamins-Minerals (ONE-A-DAY 50 PLUS PO) Take 1 tablet by mouth at bedtime.     nitroGLYCERIN  (NITROSTAT ) 0.4 MG SL tablet Place 1 tablet (0.4 mg total) under the tongue every 5 (five) minutes x 3 doses as needed for chest pain. 25 tablet 12   rosuvastatin  (CRESTOR ) 40 MG tablet Take 1 tablet (40 mg total) by mouth daily. 90 tablet 3   Semaglutide , 2 MG/DOSE, 8 MG/3ML SOPN Inject 2 mg as directed once a week. 9 mL 1   spironolactone   (ALDACTONE ) 25 MG tablet Take 0.5 tablets (12.5 mg total) by mouth daily. 45 tablet 3   triamcinolone  ointment (KENALOG ) 0.1 % Apply 1 Application topically 2 (two) times daily. 30 g 0   venlafaxine  XR (EFFEXOR -XR) 75 MG 24 hr capsule Take 1 capsule (75 mg total) by mouth daily with breakfast. 90 capsule 3   VEOZAH  45 MG TABS Take 1 tablet by mouth daily.     No facility-administered medications prior to visit.   Allergies[1]   Objective:   Today's Vitals   12/20/24 1308  BP: 130/78  Pulse: 84  Temp: 98.1 F (36.7 C)  TempSrc: Temporal  SpO2: 96%  Weight: 181 lb 12.8 oz (82.5 kg)  Height: 5' 7 (1.702 m)   Body mass index is 28.47 kg/m.   General: Well developed, well nourished. No acute distress. Abdomen: Soft. Bowel sounds positive, normal pitch and frequency. No hepatosplenomegaly. There is   moderate tenderness over the left lower quadrant. No rebound or guarding. The pain is decreased when   palpating while abdominal muscles are tensed. Back: Straight. No CVA tenderness bilaterally. Extremities: Full ROM. No joint swelling or tenderness. No edema noted. Skin: Warm and dry. No rashes. Neuro: CN II-XII intact. Normal sensation and DTR bilaterally. Psych: Alert and oriented. Normal mood and affect.  Health Maintenance Due  Topic Date Due   Hepatitis B Vaccines 19-59 Average Risk (1 of 3 - 19+ 3-dose series) Never done   COVID-19 Vaccine (6 - 2025-26 season) 08/09/2024   OPHTHALMOLOGY EXAM  10/30/2024   Imaging   CT ABDOMEN PELVIS W CONTRAST  Date: 11/12/2024 IMPRESSION: 1. No acute intra-abdominal or pelvic abnormality. 2. Multiple large nonobstructive right renal calyceal calculi measuring up to 9 mm. The right extrarenal pelvis is dilated with a nonobstructive calculus measuring 6 mm. No hydronephrosis or obstructive ureteral calculi. 3. Punctate cholecystolithiasis.  Aortic Atherosclerosis (ICD10-I70.0).  Lab Results    Latest Ref Rng & Units 11/10/2024    11:55 AM 08/16/2024    9:16 AM 02/11/2024    9:54 AM  CMP  Glucose 70 - 99 mg/dL 77  95  95   BUN 6 - 23 mg/dL 9  12  7    Creatinine 0.40 - 1.20 mg/dL 9.33  9.25  9.31   Sodium 135 - 145 mEq/L 141  143  139   Potassium 3.5 - 5.1 mEq/L 3.5  4.1  4.1   Chloride 96 - 112 mEq/L 101  103  101   CO2 19 - 32 mEq/L 32  32  33   Calcium  8.4 - 10.5 mg/dL 9.7  9.8  9.8   Total Protein 6.0 - 8.3 g/dL 7.1   7.6   Total Bilirubin 0.2 - 1.2 mg/dL 0.5   0.6   Alkaline Phos 39 - 117 U/L 54   62   AST  0 - 37 U/L 22   19   ALT 0 - 35 U/L 24   21    Component Ref Range & Units (hover) 1 mo ago (11/10/24)  Preg Test, Ur Negative   Component Ref Range & Units (hover) 1 mo ago  Color, UA Dark Yellow  Clarity, UA Cloudy  Glucose, UA Negative  Bilirubin, UA Negative  Ketones, UA Negative  Spec Grav, UA 1.025  Blood, UA 3+  pH, UA 6.0  Protein, UA Positive Abnormal   Urobilinogen, UA negative Abnormal   Nitrite, UA Negative  Leukocytes, UA Negative   Component Ref Range & Units (hover) 1 mo ago  MICRO NUMBER: [id number]  SPECIMEN QUALITY: Adequate  Sample Source NOT GIVEN  STATUS: FINAL  Result: No Growth      Assessment & Plan:   Problem List Items Addressed This Visit       Other   LLQ abdominal pain - Primary   The etiology of this remains unclear. There was no sign of kidney stones on the left. There is no evidence on the CT scan of diverticulitis and she is not running fever or having diarrhea. The CT would not be the best imaging study to evaluate the left ovary, so I recommend we move ahead with an ultrasound to assess this more thoroughly.      Relevant Orders   US  Pelvic Complete With Transvaginal    Return for Follow-up after testing/imaging.    Garnette CHRISTELLA Simpler, MD  I,Emily Lagle,acting as a scribe for Garnette CHRISTELLA Simpler, MD.,have documented all relevant documentation on the behalf of Garnette CHRISTELLA Simpler, MD.  I, Garnette CHRISTELLA Simpler, MD, have reviewed all documentation for this visit.  The documentation on 12/20/2024 for the exam, diagnosis, procedures, and orders are all accurate and complete.     [1]  Allergies Allergen Reactions   Lipitor [Atorvastatin] Itching   "

## 2024-12-20 NOTE — Telephone Encounter (Signed)
 FYI Only or Action Required?: FYI only for provider: appointment scheduled on 12/20/2024 at 1pm with Dr Garnette Simpler at PCP office.  Patient was last seen in primary care on 11/10/2024 by Nche, Roselie Rockford, NP.  Called Nurse Triage reporting Abdominal Pain.  Symptoms began several weeks ago.  Interventions attempted: OTC medications: Goody Powders and Rest, hydration, or home remedies.  Symptoms are: gradually worsening.  Triage Disposition: See HCP Within 4 Hours (Or PCP Triage)  Patient/caregiver understands and will follow disposition?: Yes--appointment today                 Copied from CRM #[id number]. Topic: Clinical - Red Word Triage >> Dec 20, 2024  8:18 AM Robinson DEL wrote: Red Word that prompted transfer to Nurse Triage: Possible diverticulitis extreme pain in lower left torso, pain is just incredible easily pain is 7-8. Reason for Disposition  [1] MILD-MODERATE pain AND [2] constant AND [3] present > 2 hours  Answer Assessment - Initial Assessment Questions Patient states she is having a lot of pain on her left side of her abdomen Patient states she has a cyst on her left ovary Patient had a CT recently Patient states she is wondering if she has diverticulitis and she has been eating a lot of popcorn lately She also states she takes NSAIDS recently too She states the pain was 7-8 out of 10 yesterday Patient states she is laying down and having very minimal pain at this time  No chance of pregnancy Patient denies known fevers, vomiting, diarrhea  Patient states that her pain is constant but the intensity increases and decreases with time. Patient states today laying down she is having very minimal pain.  Appointment made for today 12/20/2024 at 1pm with Dr Garnette Simpler at PCP office. Patient states she can wait until 1pm and would like to continue being seen at her PCP office sine they've been assessing this recent pain in this area of her  abdomen.  Patient is advised to call us  back if anything changes or with any further questions/concerns. Patient is advised that if anything worsens to go to the Emergency Room. Patient verbalized understanding.  Protocols used: Abdominal Pain - Female-A-AH

## 2024-12-20 NOTE — Telephone Encounter (Signed)
 Noted. Patient scheduled for 12/20/24 with Dr. Thedora by nurse triage.

## 2024-12-20 NOTE — Assessment & Plan Note (Signed)
 The etiology of this remains unclear. There was no sign of kidney stones on the left. There is no evidence on the CT scan of diverticulitis and she is not running fever or having diarrhea. The CT would not be the best imaging study to evaluate the left ovary, so I recommend we move ahead with an ultrasound to assess this more thoroughly.

## 2024-12-21 ENCOUNTER — Telehealth: Payer: Self-pay

## 2024-12-21 ENCOUNTER — Ambulatory Visit

## 2024-12-21 DIAGNOSIS — R1032 Left lower quadrant pain: Secondary | ICD-10-CM

## 2024-12-21 NOTE — Telephone Encounter (Signed)
 Copied from CRM #[id number]. Topic: Appointments - Transfer of Care >> Dec 21, 2024  3:44 PM Darshell M wrote: Pt is requesting to transfer FROM: Roselie Nche Pt is requesting to transfer TO: Grayce Dollar Reason for requested transfer: Distance It is the responsibility of the team the patient would like to transfer to (Dr. Dollar) to reach out to the patient if for any reason this transfer is not acceptable.   ----------------------------------------------------------------------- From previous Reason for Contact - Scheduling: Patient/patient representative is calling to schedule an appointment. Refer to attachments for appointment information.

## 2024-12-21 NOTE — Telephone Encounter (Signed)
 Per review of EPIC, patient scheduled for PUS today.   Routing FYI.

## 2024-12-21 NOTE — Telephone Encounter (Signed)
 Patient scheduled for 01/31/2025 to see Dr. Colette.

## 2024-12-27 ENCOUNTER — Ambulatory Visit: Payer: Self-pay | Admitting: Family Medicine

## 2024-12-31 ENCOUNTER — Ambulatory Visit: Admitting: Obstetrics and Gynecology

## 2024-12-31 ENCOUNTER — Encounter: Payer: Self-pay | Admitting: Obstetrics and Gynecology

## 2024-12-31 VITALS — BP 112/76 | HR 58 | Temp 98.2°F | Wt 179.0 lb

## 2024-12-31 DIAGNOSIS — K5900 Constipation, unspecified: Secondary | ICD-10-CM

## 2024-12-31 DIAGNOSIS — R1032 Left lower quadrant pain: Secondary | ICD-10-CM

## 2024-12-31 MED ORDER — MELOXICAM 15 MG PO TABS: 15.0000 mg | ORAL_TABLET | Freq: Every day | ORAL | 0 refills | Status: AC

## 2024-12-31 NOTE — Progress Notes (Unsigned)
 "  54 y.o. G68P0020 female with history of cardiomyopathy, NSTEMI in 2023 (s/p cath), hypertension, type 2 diabetes, OSA here for problem visit. Single. Works from home.  Patient's last menstrual period was 04/08/2024.   She reports pain in left ovary since early Dec.  The pain is a constant pressure that intensifies with walking and moving. Minimal pain with sitting. Rated a 2/10, has been as high as a 7/10 with walking Pain with touch to area with lying down. Taken pain medication to help with pain: tylenol  and goody powder.  Sits for a long with with work. Has been experiencing lower back pain and left ab pain. Sleep with tylenol  Pms. No vaginal irritation. No N/V. +diarrhea and constipation. Very constipated, noted possible rectal prolapse, has to push the tissue bsck inside of the rectum Some urinary discomfort due to kidney stones - following uroglogist .  11/12/24 CT ab/pelvis:  Findings ...  Vascular/Lymphatic: No aortic aneurysm. Diffuse aortoiliac atherosclerosis. No intraabdominal or pelvic lymphadenopathy.   Reproductive: Age-related atrophy of the uterus and ovaries. No concerning adnexal mass.No free pelvic fluid.   Other: No pneumoperitoneum, ascites, or mesenteric inflammation.   IMPRESSION: 1. No acute intra-abdominal or pelvic abnormality. 2. Multiple large nonobstructive right renal calyceal calculi measuring up to 9 mm. The right extrarenal pelvis is dilated with a nonobstructive calculus measuring 6 mm. No hydronephrosis or obstructive ureteral calculi. 3. Punctate cholecystolithiasis.   12/22/23 TVUS: FINDINGS: The uterus is anteverted, smooth in contour and measures 6.6 x 2.4 x 4.1 cm for volume of 33.9 mL. The endometrial stripe measures 0.3 cm in maximum thickness. Punctate areas of increased echogenicity within the endometrium may reflect microcalcifications.   The right ovary measures 2.3 x 1.1 x 1.9 cm for a volume of 2.5 mL and is normal in sonographic  appearance.  The left ovary is not visualized secondary to overlying bowel gas. No adnexal masses are identified. No significant free fluid is seen.   IMPRESSION: 1.  Left ovary not visualized secondary to overlying bowel gas. 2.  No adnexal masses. 3. Punctate areas of increased echogenicity within the endometrium may reflect microcalcifications.   GYN HISTORY: Bilateral Bartholin's glands that required incision and drainage   OB History  Gravida Para Term Preterm AB Living  2    2   SAB IAB Ectopic Multiple Live Births  2        # Outcome Date GA Lbr Len/2nd Weight Sex Type Anes PTL Lv  2 SAB           1 SAB            Past Medical History:  Diagnosis Date   COVID-19 07/14/2024   Diabetes (HCC)    High cholesterol    Hypertension    Kidney stone    Stress-induced cardiomyopathy    Past Surgical History:  Procedure Laterality Date   BARIATRIC SURGERY  2014   BREAST BIOPSY Left    lymph node bx benign   HEEL SPUR EXCISION     LEFT HEART CATH AND CORONARY ANGIOGRAPHY N/A 04/11/2022   Procedure: LEFT HEART CATH AND CORONARY ANGIOGRAPHY;  Surgeon: Dann Candyce RAMAN, MD;  Location: [facility];  Service: Cardiovascular;  Laterality: N/A;   MENISCUS REPAIR     tummy tuck     Medications Ordered Prior to Encounter[1] Allergies[2]    PE There were no vitals filed for this visit. There is no height or weight on file to calculate BMI.  Physical  Exam Vitals reviewed. Exam conducted with a chaperone present.  Constitutional:      General: She is not in acute distress.    Appearance: Normal appearance.  HENT:     Head: Normocephalic and atraumatic.     Nose: Nose normal.  Eyes:     Extraocular Movements: Extraocular movements intact.     Conjunctiva/sclera: Conjunctivae normal.  Pulmonary:     Effort: Pulmonary effort is normal.  Genitourinary:    General: Normal vulva.     Exam position: Lithotomy position.     Vagina: Normal. No vaginal  discharge.     Cervix: Normal. No cervical motion tenderness, discharge or lesion.     Uterus: Normal. Not enlarged and not tender.      Adnexa: Right adnexa normal and left adnexa normal.  Musculoskeletal:        General: Normal range of motion.     Cervical back: Normal range of motion.  Neurological:     General: No focal deficit present.     Mental Status: She is alert.  Psychiatric:        Mood and Affect: Mood normal.        Behavior: Behavior normal.       Assessment and Plan:        There are no diagnoses linked to this encounter.  Vera LULLA Pa, MD       [1] Current Outpatient Medications on File Prior to Visit  Medication Sig Dispense Refill   Acetaminophen  (TYLENOL  8 HOUR ARTHRITIS PAIN PO) Take 1 tablet by mouth daily at 6 (six) AM.     fluticasone  (FLONASE ) 50 MCG/ACT nasal spray Place into both nostrils as needed for allergies or rhinitis.     ketoconazole (NIZORAL) 2 % shampoo Apply 1 Application topically 2 (two) times a week. 120 mL 0   lisinopril  (ZESTRIL ) 40 MG tablet Take 1 tablet (40 mg total) by mouth at bedtime. 90 tablet 3   metoprolol  succinate (TOPROL -XL) 100 MG 24 hr tablet Take 1 tablet (100 mg total) by mouth daily. Take with or immediately following a meal. 90 tablet 2   Multiple Vitamins-Minerals (ONE-A-DAY 50 PLUS PO) Take 1 tablet by mouth at bedtime.     nitroGLYCERIN  (NITROSTAT ) 0.4 MG SL tablet Place 1 tablet (0.4 mg total) under the tongue every 5 (five) minutes x 3 doses as needed for chest pain. 25 tablet 12   rosuvastatin  (CRESTOR ) 40 MG tablet Take 1 tablet (40 mg total) by mouth daily. 90 tablet 3   Semaglutide , 2 MG/DOSE, 8 MG/3ML SOPN Inject 2 mg as directed once a week. 9 mL 1   spironolactone  (ALDACTONE ) 25 MG tablet Take 0.5 tablets (12.5 mg total) by mouth daily. 45 tablet 3   triamcinolone  ointment (KENALOG ) 0.1 % Apply 1 Application topically 2 (two) times daily. 30 g 0   venlafaxine  XR (EFFEXOR -XR) 75 MG 24 hr  capsule Take 1 capsule (75 mg total) by mouth daily with breakfast. 90 capsule 3   VEOZAH  45 MG TABS Take 1 tablet by mouth daily.     No current facility-administered medications on file prior to visit.  [2] Allergies Allergen Reactions   Lipitor [Atorvastatin] Itching  "

## 2025-01-03 ENCOUNTER — Ambulatory Visit (HOSPITAL_COMMUNITY)

## 2025-01-06 ENCOUNTER — Ambulatory Visit: Admitting: Physician Assistant

## 2025-01-17 ENCOUNTER — Ambulatory Visit: Admitting: Dermatology

## 2025-01-27 ENCOUNTER — Ambulatory Visit: Payer: 59 | Admitting: Obstetrics and Gynecology

## 2025-01-27 ENCOUNTER — Ambulatory Visit (HOSPITAL_BASED_OUTPATIENT_CLINIC_OR_DEPARTMENT_OTHER)

## 2025-01-28 ENCOUNTER — Encounter (HOSPITAL_COMMUNITY)

## 2025-01-31 ENCOUNTER — Encounter: Admitting: Family Medicine

## 2025-02-04 ENCOUNTER — Ambulatory Visit (HOSPITAL_COMMUNITY): Admit: 2025-02-04 | Admitting: Orthopaedic Surgery

## 2025-02-04 DIAGNOSIS — M1712 Unilateral primary osteoarthritis, left knee: Secondary | ICD-10-CM

## 2025-02-04 SURGERY — ARTHROPLASTY, KNEE, TOTAL
Anesthesia: Spinal | Site: Knee | Laterality: Left

## 2025-02-14 ENCOUNTER — Ambulatory Visit: Admitting: Nurse Practitioner

## 2025-02-16 ENCOUNTER — Ambulatory Visit: Admitting: Nurse Practitioner

## 2025-02-18 ENCOUNTER — Encounter: Admitting: Physician Assistant

## 2025-03-04 ENCOUNTER — Ambulatory Visit: Admitting: Physician Assistant

## 2025-04-21 ENCOUNTER — Ambulatory Visit: Admitting: Dermatology
